# Patient Record
Sex: Male | Born: 1945 | Race: White | Hispanic: No | Marital: Married | Smoking: Never smoker
Health system: Southern US, Community
[De-identification: ages and names within clinical notes are randomized; demographics above are authoritative.]

## PROBLEM LIST (undated history)

## (undated) DIAGNOSIS — I10 Essential (primary) hypertension: Secondary | ICD-10-CM

## (undated) DIAGNOSIS — E7800 Pure hypercholesterolemia, unspecified: Secondary | ICD-10-CM

## (undated) DIAGNOSIS — R0602 Shortness of breath: Secondary | ICD-10-CM

## (undated) DIAGNOSIS — I2510 Atherosclerotic heart disease of native coronary artery without angina pectoris: Secondary | ICD-10-CM

## (undated) DIAGNOSIS — I5022 Chronic systolic (congestive) heart failure: Secondary | ICD-10-CM

## (undated) DIAGNOSIS — G4730 Sleep apnea, unspecified: Secondary | ICD-10-CM

## (undated) DIAGNOSIS — K8020 Calculus of gallbladder without cholecystitis without obstruction: Secondary | ICD-10-CM

## (undated) DIAGNOSIS — K219 Gastro-esophageal reflux disease without esophagitis: Secondary | ICD-10-CM

## (undated) DIAGNOSIS — K5900 Constipation, unspecified: Secondary | ICD-10-CM

## (undated) DIAGNOSIS — K31819 Angiodysplasia of stomach and duodenum without bleeding: Secondary | ICD-10-CM

## (undated) DIAGNOSIS — K7460 Unspecified cirrhosis of liver: Secondary | ICD-10-CM

## (undated) DIAGNOSIS — I219 Acute myocardial infarction, unspecified: Secondary | ICD-10-CM

## (undated) DIAGNOSIS — J189 Pneumonia, unspecified organism: Secondary | ICD-10-CM

## (undated) DIAGNOSIS — D649 Anemia, unspecified: Secondary | ICD-10-CM

## (undated) DIAGNOSIS — I509 Heart failure, unspecified: Secondary | ICD-10-CM

## (undated) HISTORY — PX: CAROTID ENDARTERECTOMY: SUR193

## (undated) HISTORY — PX: CORONARY ARTERY BYPASS GRAFT: SHX141

## (undated) HISTORY — PX: EYE SURGERY: SHX253

## (undated) HISTORY — PX: TONSILLECTOMY: SUR1361

## (undated) HISTORY — PX: SHOULDER SURGERY: SHX246

## (undated) HISTORY — PX: CARDIAC CATHETERIZATION: SHX172

## (undated) HISTORY — PX: COLONOSCOPY: SHX174

---

## 1997-11-01 ENCOUNTER — Encounter: Admission: RE | Admit: 1997-11-01 | Discharge: 1997-11-01 | Payer: Self-pay | Admitting: Internal Medicine

## 1997-12-13 ENCOUNTER — Encounter: Admission: RE | Admit: 1997-12-13 | Discharge: 1997-12-13 | Payer: Self-pay | Admitting: Internal Medicine

## 1998-02-19 ENCOUNTER — Encounter: Admission: RE | Admit: 1998-02-19 | Discharge: 1998-02-19 | Payer: Self-pay | Admitting: Internal Medicine

## 1998-08-28 ENCOUNTER — Encounter: Admission: RE | Admit: 1998-08-28 | Discharge: 1998-08-28 | Payer: Self-pay | Admitting: Internal Medicine

## 1998-10-27 ENCOUNTER — Encounter: Admission: RE | Admit: 1998-10-27 | Discharge: 1998-10-27 | Payer: Self-pay | Admitting: Hematology and Oncology

## 1999-01-06 ENCOUNTER — Encounter: Admission: RE | Admit: 1999-01-06 | Discharge: 1999-01-06 | Payer: Self-pay | Admitting: Hematology and Oncology

## 1999-03-10 ENCOUNTER — Encounter: Admission: RE | Admit: 1999-03-10 | Discharge: 1999-03-10 | Payer: Self-pay | Admitting: Internal Medicine

## 1999-03-11 ENCOUNTER — Encounter: Admission: RE | Admit: 1999-03-11 | Discharge: 1999-03-11 | Payer: Self-pay | Admitting: Hematology and Oncology

## 1999-03-26 ENCOUNTER — Encounter: Admission: RE | Admit: 1999-03-26 | Discharge: 1999-03-26 | Payer: Self-pay | Admitting: Internal Medicine

## 1999-04-14 ENCOUNTER — Encounter: Admission: RE | Admit: 1999-04-14 | Discharge: 1999-04-14 | Payer: Self-pay | Admitting: Internal Medicine

## 1999-06-23 ENCOUNTER — Encounter: Admission: RE | Admit: 1999-06-23 | Discharge: 1999-06-23 | Payer: Self-pay | Admitting: Internal Medicine

## 1999-10-01 ENCOUNTER — Encounter: Admission: RE | Admit: 1999-10-01 | Discharge: 1999-10-01 | Payer: Self-pay | Admitting: Internal Medicine

## 1999-11-13 ENCOUNTER — Encounter: Admission: RE | Admit: 1999-11-13 | Discharge: 1999-11-13 | Payer: Self-pay | Admitting: Internal Medicine

## 1999-11-26 ENCOUNTER — Inpatient Hospital Stay (HOSPITAL_COMMUNITY): Admission: EM | Admit: 1999-11-26 | Discharge: 1999-11-28 | Payer: Self-pay | Admitting: Emergency Medicine

## 1999-11-26 ENCOUNTER — Encounter: Payer: Self-pay | Admitting: *Deleted

## 1999-12-17 ENCOUNTER — Encounter: Payer: Self-pay | Admitting: Cardiology

## 1999-12-18 ENCOUNTER — Inpatient Hospital Stay (HOSPITAL_COMMUNITY): Admission: EM | Admit: 1999-12-18 | Discharge: 1999-12-22 | Payer: Self-pay | Admitting: Emergency Medicine

## 2000-02-01 ENCOUNTER — Encounter: Admission: RE | Admit: 2000-02-01 | Discharge: 2000-02-01 | Payer: Self-pay | Admitting: Internal Medicine

## 2000-03-24 ENCOUNTER — Encounter: Admission: RE | Admit: 2000-03-24 | Discharge: 2000-03-24 | Payer: Self-pay | Admitting: Hematology and Oncology

## 2000-04-04 ENCOUNTER — Encounter: Admission: RE | Admit: 2000-04-04 | Discharge: 2000-07-03 | Payer: Self-pay | Admitting: *Deleted

## 2000-04-14 ENCOUNTER — Encounter: Admission: RE | Admit: 2000-04-14 | Discharge: 2000-04-14 | Payer: Self-pay

## 2000-04-28 ENCOUNTER — Encounter: Admission: RE | Admit: 2000-04-28 | Discharge: 2000-04-28 | Payer: Self-pay | Admitting: Internal Medicine

## 2000-05-18 ENCOUNTER — Encounter: Admission: RE | Admit: 2000-05-18 | Discharge: 2000-05-18 | Payer: Self-pay

## 2000-06-17 ENCOUNTER — Encounter: Admission: RE | Admit: 2000-06-17 | Discharge: 2000-06-17 | Payer: Self-pay | Admitting: Internal Medicine

## 2000-06-27 ENCOUNTER — Inpatient Hospital Stay (HOSPITAL_COMMUNITY): Admission: EM | Admit: 2000-06-27 | Discharge: 2000-06-30 | Payer: Self-pay | Admitting: Emergency Medicine

## 2000-07-04 ENCOUNTER — Encounter: Admission: RE | Admit: 2000-07-04 | Discharge: 2000-07-04 | Payer: Self-pay | Admitting: Internal Medicine

## 2000-07-19 ENCOUNTER — Encounter: Payer: Self-pay | Admitting: *Deleted

## 2000-07-19 ENCOUNTER — Ambulatory Visit (HOSPITAL_COMMUNITY): Admission: RE | Admit: 2000-07-19 | Discharge: 2000-07-19 | Payer: Self-pay | Admitting: *Deleted

## 2000-07-22 ENCOUNTER — Encounter: Payer: Self-pay | Admitting: *Deleted

## 2000-07-26 ENCOUNTER — Encounter (INDEPENDENT_AMBULATORY_CARE_PROVIDER_SITE_OTHER): Payer: Self-pay | Admitting: Specialist

## 2000-07-26 ENCOUNTER — Inpatient Hospital Stay (HOSPITAL_COMMUNITY): Admission: RE | Admit: 2000-07-26 | Discharge: 2000-07-27 | Payer: Self-pay | Admitting: *Deleted

## 2000-08-24 ENCOUNTER — Encounter: Admission: RE | Admit: 2000-08-24 | Discharge: 2000-08-24 | Payer: Self-pay | Admitting: Internal Medicine

## 2000-08-30 ENCOUNTER — Encounter: Admission: RE | Admit: 2000-08-30 | Discharge: 2000-08-30 | Payer: Self-pay | Admitting: Internal Medicine

## 2000-10-07 ENCOUNTER — Encounter: Admission: RE | Admit: 2000-10-07 | Discharge: 2000-10-07 | Payer: Self-pay

## 2000-10-26 ENCOUNTER — Inpatient Hospital Stay (HOSPITAL_COMMUNITY): Admission: EM | Admit: 2000-10-26 | Discharge: 2000-10-28 | Payer: Self-pay | Admitting: Emergency Medicine

## 2000-10-26 ENCOUNTER — Encounter: Payer: Self-pay | Admitting: Emergency Medicine

## 2000-11-10 ENCOUNTER — Encounter: Admission: RE | Admit: 2000-11-10 | Discharge: 2000-11-10 | Payer: Self-pay | Admitting: Internal Medicine

## 2000-11-22 ENCOUNTER — Encounter: Admission: RE | Admit: 2000-11-22 | Discharge: 2000-11-22 | Payer: Self-pay | Admitting: Internal Medicine

## 2000-11-28 ENCOUNTER — Encounter: Admission: RE | Admit: 2000-11-28 | Discharge: 2000-11-28 | Payer: Self-pay | Admitting: Internal Medicine

## 2000-12-30 ENCOUNTER — Encounter: Admission: RE | Admit: 2000-12-30 | Discharge: 2000-12-30 | Payer: Self-pay | Admitting: Internal Medicine

## 2001-01-09 ENCOUNTER — Ambulatory Visit (HOSPITAL_COMMUNITY): Admission: RE | Admit: 2001-01-09 | Discharge: 2001-01-09 | Payer: Self-pay | Admitting: Internal Medicine

## 2001-01-09 ENCOUNTER — Encounter: Payer: Self-pay | Admitting: Internal Medicine

## 2001-01-09 ENCOUNTER — Encounter: Admission: RE | Admit: 2001-01-09 | Discharge: 2001-01-09 | Payer: Self-pay | Admitting: Internal Medicine

## 2001-01-25 ENCOUNTER — Encounter: Admission: RE | Admit: 2001-01-25 | Discharge: 2001-01-25 | Payer: Self-pay | Admitting: Internal Medicine

## 2001-02-26 ENCOUNTER — Emergency Department (HOSPITAL_COMMUNITY): Admission: EM | Admit: 2001-02-26 | Discharge: 2001-02-26 | Payer: Self-pay | Admitting: Emergency Medicine

## 2001-02-28 ENCOUNTER — Encounter: Admission: RE | Admit: 2001-02-28 | Discharge: 2001-02-28 | Payer: Self-pay | Admitting: Internal Medicine

## 2001-02-28 ENCOUNTER — Ambulatory Visit (HOSPITAL_COMMUNITY): Admission: RE | Admit: 2001-02-28 | Discharge: 2001-02-28 | Payer: Self-pay | Admitting: *Deleted

## 2001-03-03 ENCOUNTER — Encounter: Admission: RE | Admit: 2001-03-03 | Discharge: 2001-03-03 | Payer: Self-pay | Admitting: Internal Medicine

## 2001-04-12 ENCOUNTER — Encounter: Admission: RE | Admit: 2001-04-12 | Discharge: 2001-04-12 | Payer: Self-pay

## 2001-06-04 ENCOUNTER — Encounter: Payer: Self-pay | Admitting: Emergency Medicine

## 2001-06-04 ENCOUNTER — Inpatient Hospital Stay (HOSPITAL_COMMUNITY): Admission: EM | Admit: 2001-06-04 | Discharge: 2001-06-06 | Payer: Self-pay | Admitting: Emergency Medicine

## 2001-06-18 ENCOUNTER — Encounter: Payer: Self-pay | Admitting: Emergency Medicine

## 2001-06-18 ENCOUNTER — Inpatient Hospital Stay (HOSPITAL_COMMUNITY): Admission: EM | Admit: 2001-06-18 | Discharge: 2001-06-20 | Payer: Self-pay | Admitting: Emergency Medicine

## 2001-06-18 ENCOUNTER — Encounter: Payer: Self-pay | Admitting: Internal Medicine

## 2001-07-31 ENCOUNTER — Encounter: Admission: RE | Admit: 2001-07-31 | Discharge: 2001-07-31 | Payer: Self-pay | Admitting: Internal Medicine

## 2001-08-28 ENCOUNTER — Encounter: Admission: RE | Admit: 2001-08-28 | Discharge: 2001-08-28 | Payer: Self-pay

## 2001-10-10 ENCOUNTER — Encounter: Admission: RE | Admit: 2001-10-10 | Discharge: 2001-10-10 | Payer: Self-pay | Admitting: Internal Medicine

## 2001-11-23 ENCOUNTER — Encounter: Admission: RE | Admit: 2001-11-23 | Discharge: 2001-11-23 | Payer: Self-pay | Admitting: Internal Medicine

## 2001-12-07 ENCOUNTER — Encounter: Admission: RE | Admit: 2001-12-07 | Discharge: 2001-12-07 | Payer: Self-pay | Admitting: Internal Medicine

## 2002-01-08 ENCOUNTER — Inpatient Hospital Stay (HOSPITAL_COMMUNITY): Admission: AD | Admit: 2002-01-08 | Discharge: 2002-01-11 | Payer: Self-pay | Admitting: Internal Medicine

## 2002-01-08 ENCOUNTER — Ambulatory Visit (HOSPITAL_COMMUNITY): Admission: RE | Admit: 2002-01-08 | Discharge: 2002-01-08 | Payer: Self-pay | Admitting: Internal Medicine

## 2002-01-08 ENCOUNTER — Encounter: Payer: Self-pay | Admitting: Internal Medicine

## 2002-01-08 ENCOUNTER — Encounter: Admission: RE | Admit: 2002-01-08 | Discharge: 2002-01-08 | Payer: Self-pay | Admitting: Internal Medicine

## 2002-01-26 ENCOUNTER — Encounter: Admission: RE | Admit: 2002-01-26 | Discharge: 2002-01-26 | Payer: Self-pay | Admitting: Internal Medicine

## 2002-02-21 ENCOUNTER — Emergency Department (HOSPITAL_COMMUNITY): Admission: EM | Admit: 2002-02-21 | Discharge: 2002-02-22 | Payer: Self-pay | Admitting: Emergency Medicine

## 2002-03-24 ENCOUNTER — Encounter (HOSPITAL_COMMUNITY): Admission: RE | Admit: 2002-03-24 | Discharge: 2002-06-14 | Payer: Self-pay | Admitting: *Deleted

## 2002-04-02 ENCOUNTER — Encounter: Admission: RE | Admit: 2002-04-02 | Discharge: 2002-04-02 | Payer: Self-pay | Admitting: Internal Medicine

## 2002-04-02 ENCOUNTER — Ambulatory Visit (HOSPITAL_COMMUNITY): Admission: RE | Admit: 2002-04-02 | Discharge: 2002-04-02 | Payer: Self-pay | Admitting: Internal Medicine

## 2002-04-06 ENCOUNTER — Encounter: Admission: RE | Admit: 2002-04-06 | Discharge: 2002-04-06 | Payer: Self-pay | Admitting: Internal Medicine

## 2002-04-23 ENCOUNTER — Emergency Department (HOSPITAL_COMMUNITY): Admission: EM | Admit: 2002-04-23 | Discharge: 2002-04-23 | Payer: Self-pay | Admitting: Emergency Medicine

## 2002-04-23 ENCOUNTER — Encounter: Payer: Self-pay | Admitting: Cardiology

## 2002-06-01 ENCOUNTER — Encounter: Admission: RE | Admit: 2002-06-01 | Discharge: 2002-06-01 | Payer: Self-pay | Admitting: Internal Medicine

## 2002-08-30 ENCOUNTER — Encounter: Admission: RE | Admit: 2002-08-30 | Discharge: 2002-08-30 | Payer: Self-pay | Admitting: Internal Medicine

## 2002-09-13 ENCOUNTER — Encounter: Payer: Self-pay | Admitting: Cardiology

## 2002-09-13 ENCOUNTER — Ambulatory Visit (HOSPITAL_COMMUNITY): Admission: RE | Admit: 2002-09-13 | Discharge: 2002-09-13 | Payer: Self-pay | Admitting: Internal Medicine

## 2002-10-18 ENCOUNTER — Encounter: Payer: Self-pay | Admitting: Internal Medicine

## 2002-10-18 ENCOUNTER — Encounter: Admission: RE | Admit: 2002-10-18 | Discharge: 2002-10-18 | Payer: Self-pay | Admitting: Internal Medicine

## 2002-10-18 ENCOUNTER — Ambulatory Visit (HOSPITAL_COMMUNITY): Admission: RE | Admit: 2002-10-18 | Discharge: 2002-10-18 | Payer: Self-pay | Admitting: Internal Medicine

## 2002-10-23 ENCOUNTER — Encounter: Admission: RE | Admit: 2002-10-23 | Discharge: 2002-10-23 | Payer: Self-pay | Admitting: Internal Medicine

## 2002-11-15 ENCOUNTER — Encounter: Admission: RE | Admit: 2002-11-15 | Discharge: 2002-11-15 | Payer: Self-pay | Admitting: Internal Medicine

## 2002-11-22 ENCOUNTER — Encounter: Admission: RE | Admit: 2002-11-22 | Discharge: 2002-11-22 | Payer: Self-pay | Admitting: Internal Medicine

## 2002-12-13 ENCOUNTER — Encounter: Admission: RE | Admit: 2002-12-13 | Discharge: 2002-12-13 | Payer: Self-pay | Admitting: Internal Medicine

## 2002-12-13 ENCOUNTER — Ambulatory Visit (HOSPITAL_COMMUNITY): Admission: RE | Admit: 2002-12-13 | Discharge: 2002-12-13 | Payer: Self-pay | Admitting: Internal Medicine

## 2003-01-07 ENCOUNTER — Encounter: Admission: RE | Admit: 2003-01-07 | Discharge: 2003-01-07 | Payer: Self-pay | Admitting: Internal Medicine

## 2003-01-07 ENCOUNTER — Ambulatory Visit (HOSPITAL_COMMUNITY): Admission: RE | Admit: 2003-01-07 | Discharge: 2003-01-07 | Payer: Self-pay | Admitting: Internal Medicine

## 2003-01-07 ENCOUNTER — Inpatient Hospital Stay (HOSPITAL_COMMUNITY): Admission: AD | Admit: 2003-01-07 | Discharge: 2003-01-09 | Payer: Self-pay | Admitting: Internal Medicine

## 2003-01-29 ENCOUNTER — Encounter: Admission: RE | Admit: 2003-01-29 | Discharge: 2003-01-29 | Payer: Self-pay | Admitting: Internal Medicine

## 2003-01-29 ENCOUNTER — Ambulatory Visit (HOSPITAL_COMMUNITY): Admission: RE | Admit: 2003-01-29 | Discharge: 2003-01-29 | Payer: Self-pay | Admitting: Internal Medicine

## 2003-03-18 ENCOUNTER — Encounter: Admission: RE | Admit: 2003-03-18 | Discharge: 2003-03-18 | Payer: Self-pay | Admitting: Internal Medicine

## 2003-03-22 ENCOUNTER — Encounter: Admission: RE | Admit: 2003-03-22 | Discharge: 2003-03-22 | Payer: Self-pay | Admitting: Internal Medicine

## 2003-03-22 ENCOUNTER — Encounter: Payer: Self-pay | Admitting: Internal Medicine

## 2003-03-22 ENCOUNTER — Ambulatory Visit (HOSPITAL_COMMUNITY): Admission: RE | Admit: 2003-03-22 | Discharge: 2003-03-22 | Payer: Self-pay | Admitting: Internal Medicine

## 2003-03-27 ENCOUNTER — Encounter: Admission: RE | Admit: 2003-03-27 | Discharge: 2003-03-27 | Payer: Self-pay | Admitting: Internal Medicine

## 2003-05-02 ENCOUNTER — Encounter: Admission: RE | Admit: 2003-05-02 | Discharge: 2003-05-02 | Payer: Self-pay | Admitting: Internal Medicine

## 2003-05-07 ENCOUNTER — Encounter: Admission: RE | Admit: 2003-05-07 | Discharge: 2003-05-07 | Payer: Self-pay | Admitting: Internal Medicine

## 2003-07-10 ENCOUNTER — Inpatient Hospital Stay (HOSPITAL_COMMUNITY): Admission: AD | Admit: 2003-07-10 | Discharge: 2003-07-12 | Payer: Self-pay | Admitting: Cardiology

## 2004-01-28 ENCOUNTER — Emergency Department (HOSPITAL_COMMUNITY): Admission: EM | Admit: 2004-01-28 | Discharge: 2004-01-28 | Payer: Self-pay | Admitting: Emergency Medicine

## 2004-07-02 ENCOUNTER — Ambulatory Visit: Payer: Self-pay | Admitting: Family Medicine

## 2004-07-17 ENCOUNTER — Ambulatory Visit: Payer: Self-pay | Admitting: Family Medicine

## 2004-08-26 ENCOUNTER — Ambulatory Visit: Payer: Self-pay | Admitting: Family Medicine

## 2004-09-28 ENCOUNTER — Ambulatory Visit: Payer: Self-pay | Admitting: Family Medicine

## 2004-10-07 ENCOUNTER — Ambulatory Visit: Payer: Self-pay | Admitting: *Deleted

## 2004-10-20 ENCOUNTER — Ambulatory Visit: Payer: Self-pay

## 2004-10-20 ENCOUNTER — Ambulatory Visit: Payer: Self-pay | Admitting: *Deleted

## 2004-11-03 ENCOUNTER — Ambulatory Visit: Payer: Self-pay | Admitting: Family Medicine

## 2004-11-10 ENCOUNTER — Ambulatory Visit: Payer: Self-pay | Admitting: Family Medicine

## 2004-11-13 ENCOUNTER — Ambulatory Visit: Payer: Self-pay | Admitting: Family Medicine

## 2005-03-19 ENCOUNTER — Ambulatory Visit: Payer: Self-pay | Admitting: Family Medicine

## 2005-07-27 ENCOUNTER — Ambulatory Visit: Payer: Self-pay | Admitting: Family Medicine

## 2005-08-12 ENCOUNTER — Ambulatory Visit: Payer: Self-pay | Admitting: Family Medicine

## 2006-05-09 ENCOUNTER — Ambulatory Visit: Payer: Self-pay | Admitting: Internal Medicine

## 2006-05-10 ENCOUNTER — Ambulatory Visit: Payer: Self-pay | Admitting: Cardiology

## 2006-05-10 ENCOUNTER — Inpatient Hospital Stay (HOSPITAL_COMMUNITY): Admission: RE | Admit: 2006-05-10 | Discharge: 2006-05-12 | Payer: Self-pay | Admitting: Cardiology

## 2006-05-24 ENCOUNTER — Ambulatory Visit: Payer: Self-pay | Admitting: *Deleted

## 2006-05-24 ENCOUNTER — Inpatient Hospital Stay (HOSPITAL_COMMUNITY): Admission: AD | Admit: 2006-05-24 | Discharge: 2006-05-26 | Payer: Self-pay | Admitting: *Deleted

## 2007-01-11 ENCOUNTER — Inpatient Hospital Stay (HOSPITAL_COMMUNITY): Admission: EM | Admit: 2007-01-11 | Discharge: 2007-01-14 | Payer: Self-pay | Admitting: *Deleted

## 2007-01-11 ENCOUNTER — Ambulatory Visit: Payer: Self-pay | Admitting: Cardiology

## 2007-01-25 ENCOUNTER — Ambulatory Visit: Payer: Self-pay | Admitting: Cardiovascular Disease

## 2007-06-05 ENCOUNTER — Ambulatory Visit: Payer: Self-pay | Admitting: Cardiovascular Disease

## 2007-07-18 ENCOUNTER — Ambulatory Visit: Payer: Self-pay | Admitting: Cardiovascular Disease

## 2007-07-18 ENCOUNTER — Ambulatory Visit: Payer: Self-pay

## 2007-07-18 ENCOUNTER — Encounter: Payer: Self-pay | Admitting: Cardiovascular Disease

## 2007-07-18 LAB — CONVERTED CEMR LAB
CO2: 22 meq/L (ref 19–32)
Calcium: 8.5 mg/dL (ref 8.4–10.5)
Creatinine, Ser: 1.1 mg/dL (ref 0.4–1.5)
GFR calc Af Amer: 88 mL/min
Glucose, Bld: 344 mg/dL — ABNORMAL HIGH (ref 70–99)

## 2007-07-26 ENCOUNTER — Ambulatory Visit: Payer: Self-pay | Admitting: Internal Medicine

## 2007-08-28 ENCOUNTER — Ambulatory Visit: Payer: Self-pay | Admitting: Cardiovascular Disease

## 2007-10-12 ENCOUNTER — Emergency Department (HOSPITAL_COMMUNITY): Admission: EM | Admit: 2007-10-12 | Discharge: 2007-10-12 | Payer: Self-pay | Admitting: Emergency Medicine

## 2008-10-14 ENCOUNTER — Encounter: Payer: Self-pay | Admitting: Cardiovascular Disease

## 2009-01-08 ENCOUNTER — Encounter: Payer: Self-pay | Admitting: Cardiovascular Disease

## 2009-07-02 ENCOUNTER — Encounter: Payer: Self-pay | Admitting: Cardiovascular Disease

## 2009-09-22 ENCOUNTER — Encounter: Payer: Self-pay | Admitting: Cardiovascular Disease

## 2010-03-23 ENCOUNTER — Encounter: Payer: Self-pay | Admitting: Cardiovascular Disease

## 2010-07-21 NOTE — Letter (Signed)
Summary: [facility] Return Visit  [facility] Return Visit   Imported By: Roderic Ovens 08/05/2009 15:13:33  _____________________________________________________________________  External Attachment:    Type:   Image     Comment:   External Document

## 2010-07-21 NOTE — Letter (Signed)
Summary: [facility] Return Visit  [facility] Return Visit   Imported By: Roderic Ovens 10/27/2009 11:07:06  _____________________________________________________________________  External Attachment:    Type:   Image     Comment:   External Document

## 2010-07-21 NOTE — Letter (Signed)
Summary: [facility] Medical Return Cardiology Visit   [facility] Medical Return Cardiology Visit   Imported By: Roderic Ovens 04/24/2010 12:47:34  _____________________________________________________________________  External Attachment:    Type:   Image     Comment:   External Document

## 2010-11-03 NOTE — Assessment & Plan Note (Signed)
[facility]                            CARDIOLOGY OFFICE NOTE   MALVERN, KADLEC                      MRN:          [id number]  DATE:06/05/2007                            DOB:          1945/09/27    HISTORY:  [name] returns for followup at the [facility] Cardiology  Office on June 05, 2007.  [name] is a 65 year old gentleman  with advanced coronary artery disease, persistent angina and chronic  dyspnea that has been attributed to diastolic heart failure.   Most recent assessment of [name] LV function was approximately 1  year ago where he had a left heart catheterization with ventriculography  that showed hypokinesis of the inferior wall, but overall preserved  LVEF.  He has had elevated diastolic filling pressures at the time of  his catheterization procedures.   From a symptomatic standpoint, [name] is really quite limited.  He  describes shortness of breath and chest pressure with minimal activity.  He is unable to initiate any type of exercise program because of his  marked symptoms.  At his last office visit, I increased his Imdur from  60 to 90 mg daily.  I also doubled his Lasix from 40 to 80 mg daily.  He  did not appreciate much improvement with those changes.  He tells me  that his medications have been further adjusted by Dr. Sol Passer.  He has  recently had some trouble with hypotension, but that this has improved  after further medication adjustments.  [name] denies orthopnea, PND  or edema.   CURRENT MEDICATIONS:  1. Lipitor 80 mg daily.  2. Zetia 10 mg daily.  3. Diovan 160 mg daily.  4. Zoloft 100 mg daily.  5. Lantus 80 units daily.  6. Humalog 10 units t.i.d.  7. Plavix 75 mg daily.  8. Aspirin 325 mg daily.  9. Coreg 25 mg twice daily.  10.Imdur 90 mg daily.  11.Lasix 80 mg daily.   ALLERGIES:  NKDA.   PHYSICAL EXAMINATION:  GENERAL:  [name] is alert and oriented.  He  is in no acute  distress.  VITAL SIGNS:  Weight 256, blood pressure 125/68, heart rate 68.  HEENT:  Normal.  NECK:  Normal carotid upstrokes without bruits.  Regular venous pressure  is normal.  LUNGS:  Clear bilaterally.  HEART:  Regular rate and rhythm without murmurs or gallops.  ABDOMEN:  Soft, obese, nontender.  No bruits.  EXTREMITIES:  Trace edema bilaterally in the pretibial region.  Pedal  pulses are 2+ and equal.   DIAGNOSTICS:  EKG shows sinus rhythm with first-degree AV block and a T-  wave abnormality consistent with lateral wall ischemia.   ASSESSMENT:  1. Coronary artery disease status post coronary artery bypass graft      and multiple percutaneous coronary intervention procedures with      chronic [place] Cardiovascular Society class III angina.  Mr.      Woodard is still significantly limited by angina.  It is difficult      to adjust his medications any further as he continues to  have      intermittent problems with hypotension.  I have asked him to      continue his current therapy without changes at this point.  Since      I have been following him, he has undergone 2 revascularization      procedures with a stent to a saphenous vein graft as well as to the      native right coronary artery.  He has had no improvement in      symptoms with either of these procedures.  I suspect he has      significant small-vessel coronary artery disease.  2. Diastolic heart failure.  I would like to repeat an      electrocardiogram to try to get an assessment of [name]      filling parameters.  We will continue his current medical regimen.      He is so highly symptomatic that I think he may benefit from      outpatient invasive hemodynamic monitoring.  I am going to consider      him for the CardioMEMS device which is a pulmonary arterial      monitoring device.  After review of his echocardiogram, we will      make further inquiries into this device with Dr. Gala Romney.   FOLLOW UP:   I would like to see [name] back in 4 weeks.  We will  review his echo and also I have ordered a metabolic panel and BNP.     Veverly Fells. Excell Seltzer, MD  Electronically Signed    MDC/MedQ  DD: 06/05/2007  DT: 06/06/2007  Job #: [id number]   cc:   Dina Rich

## 2010-11-03 NOTE — H&P (Signed)
[name], [name] NO.:  [id number]   MEDICAL RECORD NO.:  [id number]          PATIENT TYPE:  EMS   LOCATION:  MAJO                         FACILITY:  [facility]   PHYSICIAN:  Gerrit Friends. Dietrich Pates, MD, FACCDATE OF BIRTH:  1946/04/08   DATE OF ADMISSION:  01/11/2007  DATE OF DISCHARGE:                              HISTORY & PHYSICAL   PRIMARY CARE PHYSICIAN:  Dr. Dina Rich in [place], [place].   PRIMARY CARDIOLOGIST:  Primary cardiologist was Dr. Glennon Hamilton, now Dr.  Tonny Bollman.   CHIEF COMPLAINT:  Chest pain.   HISTORY OF PRESENT ILLNESS:  [name] is a 65 year old male with a  history of coronary artery disease.  He complains of a 2-week history of  cough and increased chest pain.  He routinely has chest pain requiring  nitroglycerin 1-2 times a week with exertion.  For the past 2 weeks, he  has had increased frequency and intensity of his chest pain episodes as  well as increased dyspnea on exertion and a cough productive of clear  sputum.  He also has sometimes had shortness of breath at rest.  Last  week, his chest pain accelerated to the point to where he was having  chest pain every day.  It was still helped by nitroglycerin, but he  called the office because of the increased frequency of his symptoms and  was told to come to the emergency room.  In the emergency room, he is  currently pain-free.  He still has increased cough and his chest pain  was severe enough to where he got nauseated and vomited x1 earlier  today, but he no longer feels nauseated.  He has not been diaphoretic.  Recently, his CBG has have been running higher than usual as well.  He  states at home they run in the low 200s and have been in the mid to high  300s recently without any change in p.o. intake.  He states that he has  had bronchitis and colds before, but does not feel like this is it.  He  feels like he has felt before when he had extra fluid on board.  He  reports increased girth and slightly increased daytime lower extremity  edema, but does not weigh himself and is not aware of any change in his  weight.   PAST MEDICAL HISTORY:  1. Status post aortocoronary bypass surgery in 1993 with a redo in      1999.  2. Status post PTCA and bare-metal stent to the SVG to circumflex in      November 2007.  3. Status post cardiac catheterization in December 2007 with LAD      totaled, circumflex totaled and RCA 80% in the midportion, SVG to      PDA widely patent, with an 80% lesion in the distal PDA, which is a      small vessel, SVG to circumflex patent with 2 patent stents and a      distal 70% to 80% stenosis that is a small vessel.  Free RIMA to  ramus intermedius is patent with no critical distal disease, LIMA      to LAD widely patent with no significant distal disease, medical      therapy for disease, not felt amenable to percutaneous      intervention.  4. Mild left ventricular dysfunction with an EF of 40% to 50% by      echocardiogram in March 2004.  5. Obesity.  6. 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  7. Family history of coronary artery disease.  8. Hypertension.  9. Hyperlipidemia.  10.History of CVA/left carotid endarterectomy.   PAST SURGICAL HISTORY:  He is status post left carotid endarterectomy as  well as multiple cardiac catheterization and bypass surgery x2.   ALLERGIES:  He has been intolerant to NORVASC and AVANDIA, which cause  edema, and LASIX, which causes cramps, although he is tolerating it with  potassium supplementation.   CURRENT MEDICATIONS:  1. Lipitor 80 mg nightly.  2. Zetia 10 mg a day.  3. Imdur 60 mg a day.  4. Diovan 160 mg daily.  5. Zoloft 100 mg a day.  6. Lasix 80 mg a day.  7. Aspirin 81 mg a day.  8. Coreg 25 mg b.i.d.  9. Lantus 80 units q.a.m.  10.Potassium 20 mEq daily.  11.Nitroglycerin sublingual p.r.n.  12.Humalog continued at t.i.d. a.c.   SOCIAL  HISTORY:  He lives in [place] with his wife and is a retired  Product/process development scientist without any history of alcohol, tobacco or drug  abuse.   FAMILY HISTORY:  Both his parents died in a motor vehicle accident in  January 2008.  His father does have a history of coronary artery  disease.  His brother has coronary artery disease.   REVIEW OF SYSTEMS:  He complains of shortness of breath as well as  increased dyspnea on exertion, orthopnea, PND, coughing and wheezing.  He has had some reflux symptoms.  He has decreased frequency of  urination.  He denies hematemesis, hemoptysis, melena, fevers or chills.  Review of systems is otherwise negative.   PHYSICAL EXAM:  VITAL SIGNS:  Temperature is 98.7, blood pressure  176/83, pulse 77, respiratory rate 22, O2 saturation 98% on room air.  GENERAL:  He is a well-developed, obese white male in no acute distress.  HEENT:  Normal.  NECK:  No lymphadenopathy, thyromegaly or JVD are noted, but he has a  bruit on the left versus radiation of murmur and a soft bruit on the  right as well.  CV:  His heart is regular in rate and rhythm with an S1 and S2 and a  systolic murmur is noted.  Distal pulses are intact in all 4  extremities.  LUNGS:  He has some rales in the base with a few crackles.  SKIN:  He has a healing abrasion on his right lower extremity and some  excoriated areas as well.  ABDOMEN:  Soft and nontender with active bowel sounds.  EXTREMITIES:  There is no cyanosis, clubbing or edema noted.  MUSCULOSKELETAL:  No joint deformity or effusions.  There is no spine or  CVA tenderness.  NEUROLOGIC:  He is alert and oriented.  Cranial nerves II-XII grossly  intact.   CHEST X-RAY:  Shows cardiomegaly, bibasilar atelectasis and a history of  bypass surgery.   EKG:  Sinus rhythm with a first-degree AV block and flattened versus  inverted T waves inferiorly, minimal change from an EKG dated December  2007.   LABORATORY  VALUES:   Hemoglobin 12.0, hematocrit 36.6, WBC 8.2, platelets  214,000.  CK-MB 206/5.3 with a troponin I of 0.02.  INR 1.0, PTT 27.  Sodium 132, potassium 4.4, chloride 102, CO2 23, BUN 25, creatinine  1.29, glucose 417.   IMPRESSION:  Chest pain [name] is having increased frequency of his  anginal symptoms.  He will be admitted for further evaluation and  treatment.  His cardiac enzymes will be cycled.  In the morning, the  data will be reviewed to see he would benefit from a repeat  catheterization or if continued medical therapy is recommended.  He will  be diuresed as well for slight volume overload.  A BNP is pending at the  time of dictation.  Further evaluation and treatment will depend on the  results of the above testing.  A hemoglobin A1c will be ordered as well.      Theodore Demark, PA-C      Gerrit Friends. Dietrich Pates, MD, [facility]  Electronically Signed    RB/MEDQ  D:  01/11/2007  T:  01/12/2007  Job:  [id number]   cc:   Dina Rich

## 2010-11-03 NOTE — Discharge Summary (Signed)
[name], [name] NO.:  [id number]   MEDICAL RECORD NO.:  [id number]          PATIENT TYPE:  INP   LOCATION:  6523                         FACILITY:  [facility]   PHYSICIAN:  Pricilla Riffle, MD, FACCDATE OF BIRTH:  01-13-1946   DATE OF ADMISSION:  01/11/2007  DATE OF DISCHARGE:  01/14/2007                               DISCHARGE SUMMARY   Cardiologist, previously Dr. Glennon Hamilton.  He now sees Dr. Tonny Bollman.  Primary care physician is Dr. Dina Rich.   REASON FOR ADMISSION:  Chest pain consistent with unstable angina  pectoris.   DISCHARGE DIAGNOSES:  1. Coronary artery disease.      a.     Status post coronary artery bypass graft with redo coronary       artery bypass graft in 1999.      b.     Status post multiple percutaneous coronary interventions in       the past.      c.     Most recent percutaneous coronary intervention prior to this       admission:  Stenting of the vein graft to the obtuse marginal of       the left circumflex, 05/10/2006.      d.     Status post Promus drug-eluting stent to the native right       coronary artery this admission.  2. Preserved left ventricular function.  3. Transient hypotension this admission - resolved.  4. Transient prerenal azotemia this admission - resolved.  5. Diabetes mellitus, uncontrolled.      a.     Hemoglobin A1c 10.7.  6. Hyperlipidemia, uncontrolled.      a.     LDL 120.  7. Hypertension.  8. Cerebrovascular disease.      a.     History of cerebrovascular accident.  9. History of left carotid endarterectomy.  10.History of neuropathy.  11.History of congestive heart failure.      a.     Suspect chronic diastolic congestive heart failure.   PROCEDURE PERFORMED THIS ADMISSION:  Cardiac catheterization,  percutaneous coronary intervention by Dr. Shawnie Pons.  Please see  his dictated note for complete details.  Briefly, LIMA to LAD patent,  free RIMA patent to the diagonal.  Vein graft to  the OM patent with  distal disease and patent stent, vein graft to the PDA patent; native  RCA with 80% stenosis - treated with Promus drug-eluting stent reducing  stenosis to 0-10%.   HISTORY:  [name] is a 65 year old male patient with a history of  coronary disease as outlined above who presented to the emergency room  at [facility] on the day of admission with complaints of 2  weeks of cough and chest pain.  His pain increased in intensity and  frequency and was associated with shortness of breath with exertion  requiring nitroglycerin.  On presentation, his chest x-ray showed  bibasilar atelectasis, cardiomegaly.  His EKG showed shallow inferior T-  wave inversions.  The patient had recently underwent cardiac  catheterization in December of 2007  revealing patent grafts.  Initially,  it was felt that the patient may be suffering from mild congestive heart  failure.  IV diuretics, bronchodilators and rapid mental were added to  his medical regimen.  BNP was checked.   HOSPITAL COURSE:  The patient ruled out for myocardial function by  enzymes.  He continued to have chest pain that was consistent with  unstable angina pectoris.  His BNP returned at borderline elevated at  194.  The patient did develop hypotension.  He was also orthostatic.  His BUN was noted to be 24 with a creatinine of 1.05.  His diuretics  were held as well as his verapamil.  It was felt that his symptoms were  more consistent with unstable angina pectoris and he was set up for  cardiac catheterization.   Patient underwent cardiac catheterization by Dr. Shawnie Pons on  01/13/2007.  He underwent Promus drug-eluting stent to the native RCA as  outlined above.  He tolerated the procedure well without any  complications.  On the morning of 01/14/2007 he was found to be in  stable condition.  His right femoral arteriotomy site was without  hematoma or bruit.  His hemoglobin was stable at 11.2.  His  creatinine  was stable at 0.98 and his BUN had dropped to 19.  He was without any  further chest pain or shortness of breath and considered stable enough  for discharge to home.   The patient's hemoglobin A1C returned to 10.7.  He notes compliance with  his medications.  His LDL was also elevated at 120.  Again, he notes  compliance with his medications.  It has been advised that the patient  follow with his primary care physician in the next couple of weeks for  further adjustment of his diabetic therapy.  Will set him up for a  repeat lipid panel in the next 4-6 weeks.  He will go home on half dose  Lasix and half dose of potassium given his presentation and will check B  met at his follow-up appointment.   The patient did request a Darvocet prescription secondary to pain from  neuropathy.  He will be provided with No. 15 tablets and no refills, and  he can get further refills from Dr. Sol Passer.   LABS AND ANCILLARY DATA:  At discharge, hemoglobin 11.2, hematocrit  33.2, white count 7700, platelet count 193.  INR 1.  Sodium 134,  potassium 4.4, glucose 288, creatinine 0.99, BUN 19, peak BUN 30, peak  creatinine 1.29, hemoglobin A1c 10.7.  Cardiac markers negative x3.  Total cholesterol 201, triglycerides 287, HDL 24, LDL 120.  TSH 1.484.   Chest X-Ray: Cardiomegaly, bibasilar atelectasis, status post CABG.   DISCHARGE MEDICATIONS:  1. Coated aspirin 325 mg daily.  2. Plavix 75 mg daily.  3. Lipitor 80 mg nightly.  4. Zetua 19 mg daily.  5. Isosorbide mononitrate 60 mg daily.   1. Diovan 160 mg daily.  2. Zoloft 100 mg daily.  3. Furosemide 80 mg 1/2 tablet daily.  4. Potassium 20 mEq 1/2 tablet daily.  5. Carvedilol 25 mg b.i.d.  6. Lantus insulin 80 units daily.  7. Humalog insulin 10 units t.i.d. with meals.  8. Nitroglycerin p.r.n. chest pain.  9. Darvocet N 100 one q. 6-8 hours p.r.n. pain.      a.     As noted above, the patient will get this refilled with Dr.        Sol Passer.   DIET:  Low-fat, low-sodium diabetic diet.   WOUND CARE:  The patient is to go to our office for any groin swelling,  pain, bruising or fever.   ACTIVITY:  The patient is to increase his activity slowly.  He is to do  no lifting for 1 week, no driving for 3 days.  No sexual activity for 2  weeks.  He may walk up steps.  He may shower.   FOLLOWUP:  1. The patient is to follow up with Dr. Excell Seltzer or his physician      assistant in the next 2 weeks.  The office will contact him with an      appointment.  2. As noted above, he will need a B met at followup.  3. He will also be set up for fasting lipids and LFT's in the next 4      weeks.  4. He should follow up with Dr. Sol Passer in the next 1-2 weeks.  He      should call for an appointment.  He will need intensive therapy of      his diabetes for better control.   Total physician PA time greater than 30 minutes on this discharge.      Tereso Newcomer, PA-C      Pricilla Riffle, MD, [facility]  Electronically Signed    SW/MEDQ  D:  01/14/2007  T:  01/15/2007  Job:  [id number]   cc:   Dina Rich

## 2010-11-03 NOTE — Assessment & Plan Note (Signed)
[facility]                            CARDIOLOGY OFFICE NOTE   Woodard, Raymond                      MRN:          [id number]  DATE:07/26/2007                            DOB:          1945/08/11    REFERRING PHYSICIAN:  Veverly Fells. Excell Seltzer, MD   PRIMARY CARE PHYSICIAN:  Dr. Dina Rich.   REASON FOR VISIT:  Consideration of pump CardioMEMS pulmonary artery  sensor.   HISTORY OF PRESENT ILLNESS:  [name] is 65 year old male with  multiple medical problems including diabetes, coronary artery disease  status post previous bypass surgery in 1999 with subsequent a multiple  interventions.  Also has hypertension, hyperlipidemia and obesity.   He has been followed closely by Dr. Excell Seltzer.  He has had problems with  persistent chest pain and dyspnea.  He has undergone several  interventions none of which really seemed to help his symptoms and he  continues to complain of severe fatigue and dyspnea.  Says he really  cannot do much of anything without getting short of breath and he has  not been exercising at all.  He says he has occasional lower extremity  edema and orthopnea.  Denies any PND.  He has had adjustments in his  Lasix dose which have also not help him much.   REVIEW OF SYSTEMS:  He says occasionally he finds himself panting a  little bit at night.  He otherwise sleeps well.  He firmly denies that  he has sleep apnea.  He does have some arthritis pain.  Has not had any  significant bleeding focal neurologic problems.   It was noted on one of his catheterization and EDP was 41.  This was  about a year and half ago.  His subsequent EDP was under 10.  Dr. Excell Seltzer  recently repeated an echocardiogram on him.  This showed ejection  fraction of 50-55%.  There was evidence of diastolic heart failure with  an elevated LV filling pressures.  There is trivial mitral  regurgitation.  RV was not well seen.  Peak pulmonary artery pressure  was  estimated at 29.   PAST MEDICAL HISTORY:  1. Coronary artery disease status post coronary artery bypass surgery      and multiple interventions.  2. History of ischemic cardiomyopathy, ejection fraction of previous      and 35% range now on the 50-55% range.  3. Morbid obesity.  4. Diabetes.  5. Hypertension.  6. Hyperlipidemia.   CURRENT MEDICATIONS:  Lipitor 80, Zetia 10, Diovan 160, Zoloft 100,  Lantus insulin, Humalog, Plavix 75, aspirin 325, Coreg 25 b.i.d. Imdur  90, Lasix 80 b.i.d.   ALLERGIES:  No known drug allergies.   SOCIAL HISTORY:  He is married.  He is retired.  He has no history of  tobacco.  He does not exercise secondary to shortness of breath.   FAMILY HISTORY:  Mother, father both alive.  Father has a history of  myocardial infarction status post bypass.  Mother has a history of  arthritis.   PHYSICAL EXAM:  He is obese man no acute distress  to image on the clinic  without any respiratory difficulty.  Blood pressure is 134 131/62, heart rate 64 weight 257.  HEENT is normal.  Thick neck.  No obvious JVD.  Carotid 2+ bilaterally.  There is a soft  right bruit.  There is no lymphadenopathy or thyromegaly.  CARDIAC:  PMI is nonpalpable.  He is regular rate and rhythm with a soft  2/6 systolic ejection murmur at the left sternal border.  There is no  rub or gallop.  LUNGS:  Clear.  ABDOMEN:  Obese, nontender, nondistended.  No obvious  hepatosplenomegaly, although exam is limited.  Good bowel sounds.  No  bruits.  EXTREMITIES:  Warm with no cyanosis or clubbing.  There is  trace edema.  No rash.  NEURO:  He is alert and x3.  Cranial nerves II-XII are intact was all  four extremities without difficulty.   ASSESSMENT/PLAN:  Dyspnea.  Think this is likely multifactorial given  his echo findings.  I do suspect he has a significant component of  diastolic heart failure which is seems to be fairly well controlled.  However, suspect he probably has sleep apnea as  well as obesity and  deconditioning contributing to his dyspnea.  At this point suggested we  go and proceed with a CPX test to clearly define delineate this  magnitude of his limitation and also assess the various contributions  and various aspects.  I have also suggested a sleep study as we have had  problems the past with the CardioMEMS sensor in people with severe sleep  apnea.  Once results of these test back will stay him back in clinic and  an old couple weeks and discuss with him further about the CardioMEMS  sensor.  I do think he would otherwise be a good candidate.  Otherwise I  think he is on a very good medical regimen for his heart failure.     Bevelyn Buckles. Bensimhon, MD  Electronically Signed    DRB/MedQ  DD: 07/26/2007  DT: 07/27/2007  Job #: [id number]   cc:   Dina Rich

## 2010-11-03 NOTE — Cardiovascular Report (Signed)
[name], [name] NO.:  [id number]   MEDICAL RECORD NO.:  [id number]          PATIENT TYPE:  INP   LOCATION:  6523                         FACILITY:  [facility]   PHYSICIAN:  Arturo Morton. Riley Kill, MD, FACCDATE OF BIRTH:  04-Jul-1945   DATE OF PROCEDURE:  01/14/2007  DATE OF DISCHARGE:  01/14/2007                            CARDIAC CATHETERIZATION   INDICATIONS FOR PROCEDURE:  The patient is a very delightful gentleman  well-known to [place].  He has previously undergone revascularization  surgery.  He has had recurrent episodes of substernal chest pain.  In  the fall of last year, he had stenting of the saphenous vein graft to  the OM system.  The current study was done because of recurrent anginal  type discomfort.  Dr. Excell Seltzer has reviewed the films, and he and I have  discussed the case in detail.  The tentative plan was to potentially  dilate the native right coronary which supplies the posterolateral  segment.  There is a saphenous vein graft that goes to the PDA, but the  PDA proximal to this is segmentally diseased in a moderately severe  fashion.  Moreover, the patient does have disease in the marginal beyond  the graft insertion site, but it is not ideal for a percutaneous  intervention, particularly in a diabetic.  The patient was appropriately  hydrated with a history of diabetes, and brought to the laboratory for  further evaluation.   PROCEDURE PERFORMED:  1. Left heart catheterization.  2. Selective coronary arteriography  3. Saphenous vein graft angiography.  4. Selective left internal mammary angiography.  5. Percutaneous stenting of the right coronary artery.  6. Interactive intravascular ultrasound, both pre and post procedure.   DESCRIPTION OF PROCEDURE:  The patient was brought to the cath lab and  prepped and draped in the usual fashion.  Through an anterior puncture  the femoral artery was entered and a 6-French sheath was placed.  Following this  diagnostic images of the left and right coronaries were  obtained.  Saphenous vein graft and internal mammary angiography were  performed.  The left heart pressures were measured, but we limited left  ventriculography in order to reduce contrast load.  Dr. Excell Seltzer and I  discussed the case and we elected to proceed on with percutaneous  intervention.   The patient had been given chewable aspirin, and received 600 mg of oral  clopidogrel in the laboratory and bivalirudin was given according to  protocol.  ACT was checked and found to be appropriate for percutaneous  intervention.  Following this, a Luge wire was placed in the distal  right coronary.  Preprocedural intravascular ultrasound was done to try  to assess the anatomy, vessel size, and appropriate intervention.   Following this, predilatation was subsequently performed using a 3 mm x  12 Maverick at 10 atmospheres.  We then deployed a 3.5 x 15 [place]  scientific Proma stent.  This was deployed at 14 atmospheres for 64  seconds.  Following this, we went in with a 4 mm PowerSail, 13 mm  length, and high pressure inflations were  performed in the stent.   Following this, intravascular ultrasound was performed.  The ultrasound  demonstrated excellent deployment of the stent throughout the lesion  areas of both the proximal and distal limbs, appear to be significantly  under deployed.  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  This was also  accompanied by calcification.  After the ultrasound was performed, we  elected to go back in and post dilate with a 5 mm, 8 mm length Quantum  Maverick balloon.  This was initially done both proximally and distally  with the distal dilated up to six atmospheres and the proximal up to  nine atmospheres.  We thought this would provide relatively complete  apposition based upon preprocedural measurements.  Unfortunately,  intravascular  ultrasound was then performed and again demonstrated  incomplete apposition of the distal portion of the stent and a  significant incomplete apposition of the proximal portion of the stent.  As a result, we went back with a 5 mm balloon and a 10 atmospheres  inflation was performed at the distal extent.  More proximally, we did  up to a 13 mm 13 atmosphere inflation, proximal portion of the stent and  then intravascular ultrasound was again repeated.  The entire length of  the stent distal up to the very proximal rim was completely and  excellently deployed.   By careful measurement with intravascular ultrasound there is  approximately 0.2 to 0.3 mm with two stent tines and less than one  quadrant which were incompletely opposed.  This was based upon the  nonuniform lumen of the area just above the stent, and it also was  dramatically improved from the previous studies and we elected to not  further post dilate because of the fear of a possible vessel disruption  given the large size of the balloon required to completely oppose this  tiny section of stent.  All catheters were then subsequently removed and  the femoral sheath was sewn into place.  He was taken to his room in  satisfactory clinical condition.   HEMODYNAMIC DATA:  Central aortic pressure was 156/79, mean 110, left  ventricular pressure was 138/30.  There is not a significant gradient on  pullback across aortic valve.   ANGIOGRAPHIC DATA:  1. The left main coronary artery has about 30% narrowing in its distal-      most aspect.  It is heavily calcified.  2. The left anterior descending artery has multiple lesions leading      into a diagonal that is small and diffusely diseased.  There are      probably two 80-90% proximal lesions and the LAD proper is totally      occluded.  The diagonal is small and diffusely diseased.  3. The circumflex proper demonstrates an 80-90% proximal stenosis and      the OM is totally  occluded.  4. The right coronary artery demonstrates 30-40% proximal      calcification and then a mid 80% stenosis.  In the distal vessel,      there is 30-40% segmental plaquing and a  previously placed stent.      There is competitive flow into the PDA with proximal disease in the      PDA as noted above.  There is also flow into a very large      posterolateral branch with some segmental plaquing throughout in a      typical diabetic  appearance.  Following stenting, the right      coronary artery is reduced to about 10% residual luminal narrowing      with what appears to be really an excellent angiographic response.  5. The saphenous vein graft to the obtuse marginal does remain intact.      There is some proximal narrowing of 30-40% and a fair amount of      luminal irregularity.  The previously placed stent appears to be      widely patent.  This does insert into the obtuse marginal branch.      The obtuse marginal branch itself fills both retrograde and      antegrade.  The antegrade portion has about 40-50% segmental      plaquing and then a focal 50-70% area of narrowing prior to its      more distal bifurcation.  This entire segment of the obtuse      marginal branch appears to be fairly typical of diabetes in      appearance.  There is retrograde filling of three small subbranches      and this does not appear to be severely compromised although the      segment leading into that has probably 70% narrowing in the      retrograde fashion.  6. The free LIMA graft is hooded into the top of this obtuse marginal      graft and supplies the diagonal branch.  There is really quite      smooth excellent appearance to this.  7. The saphenous vein graft does insert into the posterior descending      branch.  There is some mild luminal irregularity distally in the      vein graft, but it is not critically narrowed.  The PDA itself has      a typical diabetic appearance more distally and  has at least 70-80%      in a small focal area of what appears to be about 1 mm vessel.  The      PDA filling retrograde does demonstrate what appears to be      moderately high-grade disease of the proximal portion of the      posterior descending filling retrograde.  8. The internal mammary has about 30-40% area of narrowing proximally      which is unchanged from the previous study.  It is not      significantly obstructed.  The internal mammary itself more      distally inserts very nicely into the left anterior descending      artery.  It wraps the apex where there is twin branches at the      apical portion.  There is also a small diagonal which is fairly      typical of diabetes in appearance.   The intervascular ultrasound was done interactively with preprocedural  ultrasound analysis followed by postprocedure evaluation of stent  deployment.  The initial preprocedural vessel analysis demonstrated a  large caliber right coronary artery which in some places was up to 5 mm  x 5 mm in luminal dimension.  Importantly, there is extensive  calcification, and variability in size of the vessel throughout the  contour, and therefore an absolute normal reference vessel diameter was  nearly impossible to attest to in this ultrasound procedure.  At the  original lesion site, the lesional area was 3.59 mm squared.  An area of  calcification just above the lesion without  significant plaquing  demonstrated circumferential calcification with 3.5 x 3.2 lumen.  More  distally, in the area where the vessel would finish demonstrates a 3.5 x  3.5 lumen above the lesion, but with considerable plaque throughout the  vessel.   Following the initial set of dilatations, the stent throughout  approximately 13-14 mm was clearly well deployed.  The distal most  aspect of the stent measuring about 0.5 mm of the stent, at the distal  most aspect was incompletely opposed.  After dilatation to 10  atmospheres  with a 5 mm balloon, there was complete and entire  apposition of the distal aspect of the stent.  Also the whole length of  the stent appeared to be well deployed as previously noted, and the  minimum lumen diameter after the post dilatation with the 5 mm balloon  was clearly improved.  The most proximal aspect of the stent  demonstrated about between 0.2 and 0.4 mm of undeployed stent at the  top.  There was a somewhat oblong appearing lumen which was not  completely round.   At the completion of the procedure there appeared to be only one or two  stent tines located nearly one-to-one quadrant at the area of ovoid  calcification which were not completely opposed but this distance did  not exceed 0.3 mm in length, and was minimal, please see the report.   CONCLUSIONS:  1. Prior coronary revascularization surgery with continued patency of      the internal mammary to the LAD.  2. Continued patency of the saphenous vein graft to the OM following      late 2007 stenting.  3. Continued patency of a free right internal mammary to the diagonal.  4. Continued patency of the saphenous vein graft to the posterior      descending.  5. High-grade lesion in the native coronary artery that provides flow      to a large posterolateral system with successful balloon      dilatation.  6. Interactive intravascular ultrasound for stent deployment as noted      in the above text.   DISPOSITION:  The patient will continue to follow up with Dr. Excell Seltzer.  He will require aspirin and Plavix for a minimum of one year, and  potentially longer.  I have reviewed the findings with Dr. Excell Seltzer.      Arturo Morton. Riley Kill, MD, [facility]  Electronically Signed     TDS/MEDQ  D:  01/14/2007  T:  01/15/2007  Job:  [id number]   cc:   Arturo Morton. Riley Kill, MD, [facility]  Veverly Fells. Excell Seltzer, MD  Cecil Cranker, MD, [facility]

## 2010-11-03 NOTE — Assessment & Plan Note (Signed)
[name]                            CARDIOLOGY OFFICE NOTE   BANKS, CHAIKIN                      MRN:          [id number]  DATE:01/25/2007                            DOB:          [birth year]    [name] is a 65 year old gentleman with advance coronary artery  disease and recent percutaneous coronary intervention, who presents  today for hospital followup.  He was admitted July 23 through July 26  with unstable angina.  He has had prior bypass surgery and multiple  stents placed.  His native right coronary artery was treated with a drug-  eluting stent during his most recent admission.  The lesion in his right  coronary artery was moderate and relatively stable, but with his ongoing  symptoms, we elected to proceed with PCI.  Dr. Riley Kill performed the  intervention and the patient tolerated the procedure well.  Unfortunately, since his return home, he has continued to have a great  deal of chest pain and shortness of breath.  His chest pain is typical  for angina, as it occurs with activity and feels like pressure across  his chest.  He also becomes shortness of breath with that.  He complains  of a cough and also of orthopnea.  His weight has not changed and he  does not complain of edema.  He has been taking his medications reliably  and seems to be tolerating them well.   CURRENT MEDICATIONS:  1. Lipitor 80 mg daily.  2. Zetia 10 mg daily.  3. Imdur 60 mg daily.  4. Diovan 160 mg daily.  5. Zoloft 100 mg daily.  6. Lantus 80 units daily.  7. Humalog 10 units three times daily.  8. Plavix 75 mg daily.  9. Lasix 40 mg daily.  10.Aspirin 325 mg daily.  11.Coreg 25 mg twice daily.   PHYSICAL EXAMINATION:  He is alert and oriented and in no acute  distress.  His weight is 243 pounds.  Blood pressure is 110/70, heart rate 68,  respiratory rate 16.  HEENT:  Normal.  NECK:  Normal carotid upstrokes without bruits.  Jugular venous  pressure  is normal.  LUNGS:  Clear to auscultation bilaterally.  CARDIOVASCULAR:  The apex is not palpable.  The heart is regular rate  and rhythm without murmurs or gallops.  The heart sounds are distant.  ABDOMEN:  Soft, obese, nontender.  No organomegaly.  EXTREMITIES:  There is trace bilateral pretibial edema, no clubbing or  cyanosis.  Peripheral pulses are intact and equal.   EKG:  Normal sinus rhythm with poor R-wave progression across the  precordium and nonspecific T-wave changes.   ASSESSMENT:  [name] is a 65 year old gentleman with the following  cardiac issues:  1. Coronary artery disease.  He certainly has advanced coronary artery      disease.  He recently underwent percutaneous intervention of the      native right coronary artery.  There was a moderately severe lesion      in the right coronary artery that was treated with a Promus drug-  eluting stent.  The vessel turned out to be a very large vessel, as      Dr. Riley Kill performed the intervention with guidance of      intravascular ultrasound.  The stent was dilated up to 5 mm and had      an excellent result.  His remaining coronary and graft anatomy was      stable and there were no other areas approachable via percutaneous      intervention.  Unfortunately, [name] continues to have angina      and signs suggestive of diastolic congestive heart failure.  Of      note, his filling pressures were elevated at the time of his      catheterization.  I have recommended the following changes to his      medical regimen:  An increase in Imdur from 60 to 90 mg and an      increase in Lasix from 40 to 80 mg daily.  This was reviewed with      the patient in detail and prescriptions were given to him.  He is      to call back next week, or we will contact him if we do not hear      from him by midweek.  If he continues to be symptomatic, we will      increase his Lasix further, as I suspect he has significant       elevation in his diastolic filling pressures, based on his      symptoms.  A basic metabolic panel will also be checked next week.  2. Diastolic congestive heart failure.  He currently is increasingly      symptomatic.  Changes as noted above with increase in Imdur and      increase in Lasix.  We will follow BUN and creatinine closely.      Continue Coreg 25 mg twice daily as well as Diovan.  3. Diabetes.  His hemoglobin A1c in the hospital was greater than 10.      He is on insulin and is under the care of Dr. Sol Passer.  We will defer      treatment to Dr. Sol Passer.   I would like to see [name] back in approximately 1 month.  Again,  we will contact him by telephone next week to see if he is making any  progress with regard to his symptoms.     [name]. Raymond Seltzer, MD  Electronically Signed    MDC/MedQ  DD: 01/25/2007  DT: 01/26/2007  Job #: [id number]   cc:   Dina Rich

## 2010-11-03 NOTE — Assessment & Plan Note (Signed)
[facility]                            CARDIOLOGY OFFICE NOTE   [name], [name]                      MRN:          [id number]  DATE:07/18/2007                            DOB:          [birth year]    [name] was seen in follow-up at the [facility] office on  July 18, 2007.  [name] is a 65 year old gentleman with coronary  artery disease status post CABG, diastolic heart failure, and chronic  chest pain.  [name] has undergone multiple catheterization  procedures for evaluation of his chronic recurrent chest pain.  In the  past year [name] has undergone saphenous vein graft stenting as well as  stenting of the native right coronary artery.  Unfortunately, [name] has not  had much symptomatic improvement after his PCI procedures.  [name] continues  to have dyspnea with fairly minimal exertion.  At rest, [name] has no  complaints.  Even with walking on flat ground around his home, [name]  develops chest pressure and dyspnea.  [name] has mild orthopnea and no PND.  [name] has had mild edema as well.   CURRENT MEDICATIONS:  1. Lipitor 80 mg daily.  2. Zetia 10 mg daily.  3. Diovan 160 mg daily.  4. Zoloft 100 mg daily.  5. Lantus 80 units daily.  6. Humalog 10 units t.i.d. on a sliding scale.  7. Plavix 75 mg daily.  8. Aspirin 325 mg daily.  9. Coreg 25 mg twice daily.  10.Imdur 90 mg daily.  11.Lasix 80 mg b.i.d.   ALLERGIES:  No known drug allergies.   EXAM:  The patient is alert and oriented.  [name] is in no acute distress.  Weight is 263.  Blood pressure 126/75, heart rate 70, respiratory rate  16.  HEENT:  Normal.  NECK:  Normal carotid upstrokes without bruits.  Jugular venous pressure  is normal.  LUNGS:  Clear bilaterally.  CARDIAC:  The apex is discrete and nondisplaced.  Heart is regular rate  and rhythm with a 2/6 systolic ejection murmur along the left sternal  border.  There are no diastolic murmurs or gallops.  ABDOMEN:  Soft,  obese, nontender, no organomegaly.  EXTREMITIES:  There is trace pretibial edema bilaterally.  Pedal pulses  are 2+ and equal throughout.  SKIN:  Warm and dry without rash.   An echocardiogram was performed in the office.  Results are currently  pending.  Lab work was drawn today as well.  Results of this are also  pending.   ASSESSMENT:  1. Coronary artery disease, status post coronary artery bypass      grafting.  [name] has stable symptoms.  His symptoms are      clearly out of proportion to his obstructive coronary artery      disease as his most recent percutaneous coronary intervention      procedures have really resulted in minimal symptomatic improvement.      [name] does have the distal small-vessel disease that may contribute to      some of his symptoms.  I will continue his current medical therapy  for now.  This includes dual antiplatelet therapy with aspirin and      Plavix, a long-acting nitrate, and beta blockade as well as statin      therapy.  2. Diastolic heart failure.  [name] most significant limitation      relates to chronic dyspnea that has been attributed to the      diastolic heart failure.  [name] has had elevated filling pressures at      the time of his catheterization procedures.  At his catheterization      in November 2007, his left ventricular end-diastolic pressure was      41 mmHg.  On repeat catheterization with aggressive medical      therapy, his end-diastolic pressure was in the normal range but his      systolic blood pressure was only 88 at that time.  [name] has had      Doppler parameters by echocardiography consistent with high filling      pressures as well.  [name] is on aggressive therapy with Lasix, Imdur,      Diovan and Coreg and continues to be markedly symptomatic.  I      discussed this case with Dr. Gala Romney, and I am going to refer him      for consideration of the enrollment in the CardioMEMS device trial.      This is a  pulmonary artery pressure monitoring device to aid in      outpatient treatment of diastolic heart failure.   For follow-up, I will see [name] back in approximately 8 weeks.     Raymond Fells. Excell Seltzer, MD  Electronically Signed    MDC/MedQ  DD: 07/18/2007  DT: 07/19/2007  Job #: [id number]   cc:   Bevelyn Buckles. Bensimhon, MD  Dina Rich

## 2010-11-03 NOTE — Assessment & Plan Note (Signed)
[facility]                            CARDIOLOGY OFFICE NOTE   SHARON, STAPEL                      MRN:          [id number]  DATE:08/28/2007                            DOB:          08/28/1945    [name] returns for followup at the [facility] Cardiology office on  August 28, 2007.  [name] is a very nice 65 year old gentleman with  coronary artery disease and diastolic heart failure.  He has chronic  dyspnea and chest pain that have been out of proportion to his objective  findings.  However, he has had periods of markedly elevated left  ventricular diastolic pressures.  He was referred to Dr. Gala Romney for  consideration of CardioMEMS for consideration of a CardioMEMS pulmonary  artery sensor to help manage his volume status.  However, [name]  has decided not to move forward with that evaluation, as he is currently  feeling better.   [name] he has cut way back on his food intake, and has been able to  lose 15 pounds since his visit in January 2009.  He is really feeling  much better at this time.  He continues to have dyspnea, but it is much  less than his baseline.  He has no orthopnea, PND, or edema.  He has had  a few chest pains, but again much less than his normal frequent left-  sided chest pain.   MEDICATIONS:  1. Imdur 30 mg daily.  2. Lasix 40 mg daily.  3. Lipitor 80 mg daily.  4. Zetia 10 mg daily.  5. Diovan 160 mg daily.  6. Zoloft 100 mg daily  7. Lantus 80 units daily.  8. Humalog 10 units t.i.d.  9. Plavix 75 mg daily.  10.Aspirin 325 mg daily.  11.Coreg 25 mg twice daily.   PHYSICAL EXAMINATION:  GENERAL:  The patient is alert and oriented.  He  is in no acute distress.  VITAL SIGNS:  Weight is 249, blood pressure 130/72, heart rate 68,  respiratory rate 12.  HEENT:  Normal.  NECK:  Normal carotid upstrokes. without bruits.  Jugular venous  pressure is normal.  LUNGS:  Clear bilaterally.  HEART:   Regular rate and rhythm, without murmurs or gallops.  ABDOMEN:  Soft, nontender, obese.  No organomegaly.  EXTREMITIES:  No clubbing, cyanosis or edema.  Peripheral pulses 2+ and  equal throughout.   ASSESSMENT:  1. Chronic diastolic heart failure.  [name] is much improved.      Continue current medical regimen.  He has decreased his Lasix dose      on his own, and he appears to be euvolemic on exam.  No changes      were made in medications.  2. Coronary artery disease, status post capture multiple percutaneous      coronary intervention procedures.  As reviewed previously, he has      really had little symptomatic improvement since his latest coronary      stents were placed.  We will continue his medical regimen, which      includes dual antiplatelet therapy  with aspirin and Plavix, as well      as aggressive lipid lowering with Lipitor and Zetia.  3. Hypertension.  Continue current medications.  4. Dyslipidemia.  Followed by Dr. Sol Passer.  Remains on high-dose Lipitor      well as Zetia.   For followup, I would like to see [name] back in 3 months.  I have  applauded his efforts at weight loss.  He is going to try to increase  his exercise now that he is able to do more without shortness of breath.     Veverly Fells. Excell Seltzer, MD  Electronically Signed    MDC/MedQ  DD: 08/28/2007  DT: 08/29/2007  Job #: [id number]   cc:   Dina Rich

## 2010-11-06 NOTE — Discharge Summary (Signed)
[place]  Patient:    [name], [name]                      MRN: [id number] Adm. Date:  [id number] Disc. Date: 06/30/00 Attending:  Junious Silk Dictator:   Abelino Derrick, P.A.C. [facility]                  Referring Physician Discharge [place]  DISCHARGE DIAGNOSES: 1. Coronary disease, catheterization this admission for chest pain showing    patent stent sites but distal posterior descending artery disease, to be    treated medically. 2. Known coronary disease, status post coronary artery bypass grafting in 1993    with stenting to the saphenous vein graft to obtuse marginal in June 2001    and stenting of the native right coronary artery in July 2001. 3. Insulin-dependent diabetes. 4. Treated hypertension. 5. Treated hyperlipidemia. 6. Carotid artery bruits.  HOSPITAL COURSE:  Patient is a 65 year old male followed by Dr. Glennon Hamilton and [facility].  He has a history as noted above.  He presented to the emergency room June 27, 2000 with recurrent pain, "just like before." On exam in the emergency room, he had bilateral carotid bruits.  He has apparently not had this worked up previously.  His Glucophage was held and he was put on IV nitro and Lovenox.  He was set up for cardiac catheterization, which was done June 28, 2000 by Dr. Riley Kill.  This revealed patent stent sites to the native RCA and SVG to OM and 90% diffuse PDA disease after the graft insertion.  He was ambulated and transferred to the floor.  CKs were slightly elevated at 275 but MBs were negative and troponins were negative. He did have cough and he was started on a Z pack for bronchitis.  Dr. Jens Som feels he can be discharged June 30, 2000.  He has a previous appointment with Dr. Corinda Gubler on January 15 and he will keep that.  I have set him up for carotid artery Dopplers to be done January 14.  LABORATORY DATA:  White count 10.4, hemoglobin 13.6,  hematocrit 41, platelets 256.  Sodium 135, potassium 4.4, BUN 20, creatinine 1.2.  INR 0.8.  Amylase and lipase are within normal limits.  CKs as noted.  UA was unremarkable.  His EKG in the emergency room showed sinus rhythm, septal Qs.  DISPOSITION:  Patient is discharged in stable condition.  FOLLOW-UP:  With Dr. Corinda Gubler on January 15.  He will have Dopplers done on January 14. DD:  06/30/00 TD:  06/30/00 Job: [id number]

## 2010-11-06 NOTE — Cardiovascular Report (Signed)
[place]  Patient:    JAMAN, ARO                      MRN: [id number] Proc. Date: 11/27/99 Adm. Date:  [id number] Disc. Date: [id number] Attending:  Nathen May CC:         [facility]             E. Graceann Congress, M.D. [facility]                        Cardiac Catheterization  PROCEDURES:  Cardiac catheterization and X-Sizer thrombectomy and stent.  INDICATIONS:  Mr. Sizelove is 65 years old and has had a re-do bypass surgery in 1999 at which time he had an old LIMA to the LAD and a new free RIMA to the intermediate branch of the circumflex, a new vein to the circumflex and a new sequential vein to the posterior descending and posterolateral branches of the right coronary artery.  He was admitted recently with recurrent chest pain. His CKs were negative.  DESCRIPTION OF PROCEDURE:  The procedure was performed via the right femoral artery using an arterial sheath and 6 French preformed coronary catheters.  We used a left bypass graft catheter for engaging the circumflex graft and a lima catheter for engaging a LIMA graft.  After completion of the diagnostic study we recognized there was a tight stenosis in the vein graft in the circumflex marginal vessel and made plans for intervention.  There was also a occlusion in the distal limb of the vein graft to the posterior descending and posterolateral branches of the right coronary artery but we elected to treat this initially medically.  The patient was given weight-adjusted heparin to prolong the ACT greater than 200 seconds and was given double bolus Integrilin and infusion.  The patient was consented for the X-TRACT protocol and was randomized to the X-Sizer catheter.  We used a AL-1 8 [place] guiding catheter and a long Extra Support wire and crossed the lesion without difficulty.  We used a 2.0 X-Sizer catheter and made three passes across the lesion.  There was  TIMI-3 flow before and after the passing of the X-Sizer.  We then stented with a 4.0, 18 mm Ultra stent deploying this with one inflation of 15 atmospheres for 45 seconds.  Repeat diagnostic studies were then performed though the guiding catheter.  The patient tolerated the procedure well and left the laboratory in satisfactory condition.  RESULTS:  The aortic pressure was 136/79 with a mean of 100.  Left ventricular pressure was 136/30.  The left main coronary artery:  The left main coronary artery was free of significant disease.  Left anterior descending:  The left anterior descending artery was completely occluded at its origin.  Circumflex artery:  The circumflex artery gave rise to an intermediate branch and then was completely occluded.  The intermediate branch had 80% proximals stenosis and competing flow distally.  Right coronary artery:  The right coronary was a moderate sized vessel that gave rise to a marginal branch, a posterior descending and two posterolateral branches.  There were 40% and 70% distal stenoses in the right coronary artery, 70% stenosis in the proximal portion of the posterior descending branch with competing flow distally, and 80% stenosis in the distal right coronary artery after the posterior descending branch before the posterolateral branch.  The saphenous vein graft to the posterior  descending and posterolateral branch of the right coronary artery was patent in its proximal limb but occluded in its distal limb.  There was 80% stenosis in the posterior descending branch at the insertion site.  The saphenous vein graft to the marginal branch of the circumflex artery was patent but there was a 90% stenosis in its mid and distal portion.  The free RIMA graft arose from the vein graft to the marginal branch and supplied the intermediate branch and was patent and functioned normally.  The LIMA graft to the LAD was patent and functioned  normally.  LEFT VENTRICULOGRAPHY:  The left ventriculogram was performed in the RAO projection showed akinesis of the anterolateral wall.  The overall wall motion was good with an estimated ejection fraction of 50%.  Following use of the X-Sizer catheter the stenosis improved from 90% to 70%. There was a type A linear dissection following use of the X-Sizer catheter. Following stenting, the stenosis improved to 0% and there was no residual dissection.  TIMI flow was TIMI-3 before and after intervention.  CONCLUSIONS: 1. Coronary artery disease, status post re-do coronary bypass surgery in    1999 with total occlusion of the left anterior descending artery and    circumflex arteries and 70% and 80% stenoses in the distal right coronary    artery.  There is a occlusion of the distal limb of the sequential vein    graft to the posterior descending and posterolateral branch of the    right coronary artery, 90% stenosis in the vein graft to the circumflex    marginal vessel, a patent free right internal mammary artery graft to the    intermediate branch of the circumflex artery, and a patent left internal    mammary artery graft to the left anterior descending.  The left    ventriculogram shows anterolateral wall hypokinesis. 2. Successful thrombectomy with the X-Sizer catheter and successful stenting    of the saphenous vein graft to the marginal branch of the circumflex artery    with improvement in percent diameter narrowing from 90% to 0%.  DISPOSITION:  The patient was returned to the postangioplasty unit.  We will plan initial medical treatment of the distal right coronary artery disease and plan a stress Cardiolite as an outpatient in the next few weeks.  If there is ischemia in the inferolateral region, then we might consider intervention through the native vessel to the posterolateral branch. DD:  11/27/99 TD:  12/02/99 Job: [id number]

## 2010-11-06 NOTE — Consult Note (Signed)
NAME:  [name], [name] NO.:  [id number]   MEDICAL RECORD NO.:  [id number]                   PATIENT TYPE:  INP   LOCATION:  3705                                 FACILITY:  [facility]   PHYSICIAN:  Veneda Melter, M.D.                   DATE OF BIRTH:  1945/12/13   DATE OF CONSULTATION:  01/07/2003  DATE OF DISCHARGE:                                   CONSULTATION   HISTORY OF PRESENT ILLNESS:  [name] is a 65 year old white male who has  diabetes mellitus, hypertension, dyslipidemia, obesity, and coronary artery  disease who presents for assessment of substernal chest discomfort.  The  patient has undergone coronary artery bypass graft surgery in 1993 with redo  in 1999.  He has had cardiac catheterizations performed in 2002 and 2003  showing native severe three vessel coronary artery disease and many patent  bypass grafts.  He has had mild to moderate LV dysfunction.  He had been  doing well until the past several months when he noted the increase in  frequency and severity of substernal chest discomfort.  He has run out of  his Imdur and subsequently stopped taking it.  In June, he was seen by Dr.  Ronne Binning who advised hospital admission for his crescendo symptoms and he  refused.  He presents today to the clinics with continued chest discomfort  and was subsequently admitted.  He has noted 10/10 chest pain at rest or  with exertion.  It has been associated with shortness of breath and  occasional nausea.  He has also noted some shortness of breath and  increasing dyspnea on exertion.  He does admit to a weight gain of 25 pounds  over the last six months.  He has not had any syncope or presyncope.  He has  noticed some swelling in his legs.  He has had some chronic back pain.  He  denies any claudication.  No constitutional symptoms.   REVIEW OF SYSTEMS:  Otherwise noncontributory.   PAST MEDICAL HISTORY:  1. Obesity.  2. Diabetes mellitus, insulin  requiring.  3. Pulmonary hypertension.  4. Dyslipidemia.  5. Coronary artery disease, status post surgical revascularization in 1993     and 1999.  Catheterizations in 2002 and 2003 showed severe native three     vessel coronary artery disease, patent LIMA to the LAD, vein graft to the     posterior descending coronary artery and vein graft to left circumflex     artery with free RIMA that arises from the left circumflex artery and     continues to intermediate artery.  6. Left carotid endarterectomy as well as gastric stapling and reversal.   ALLERGIES:  LASIX causes cramps.  NORVASC and AVANDIA causes edema.   CURRENT MEDICATIONS PRIOR TO ADMISSION:  1. Toprol XL 100 mg daily.  2. Accupril 20 mg daily.  3. Lasix  20 mg daily.  4. Aspirin 81 mg daily.  5. Insulin 70/30 with 70 units q.a.m. and 65 units q.p.m.  6. Lipitor 20 mg a day.  7. Glucophage 2 g daily.  8. Nitroglycerin as needed.   SOCIAL HISTORY:  The patient lives in [place] with his wife.  He is  disabled.  He denies current alcohol or tobacco use.   FAMILY HISTORY:  Mother alive at age 47 without coronary disease.  Father  died at the age of 42 with coronary disease.  One brother alive and well  with coronary disease.   PHYSICAL EXAMINATION:  GENERAL APPEARANCE:  He is an obese white male in no  acute distress.  VITAL SIGNS:  Temperature 96.4, blood pressure 162/89, pulse 74, respiratory  rate 20, O2 saturation 96% on room air.  Weight is 272 pounds.  HEENT:  Pupils are equal, round, reactive to light and accommodation.  Extraocular muscles are intact.  Oropharynx shows no lesion.  NECK:  Supple. There is a soft left-sided bruit.  No adenopathy.  LUNGS:  Show diminished breath sounds bilaterally, otherwise clear.  CARDIOVASCULAR:  Regular rate with systolic ejection murmur.  ABDOMEN:  Protuberant and nontender.  EXTREMITIES:  There is 1+ pedal edema.  NEUROLOGIC:  Motor strength is 5/5.  Sensory is intact to  touch.  Cranial  nerves II-XII intact.   ECG shows normal sinus rhythm at 78 with evidence of an old anterior septal  myocardial infarction but no acute ST changes.   LABORATORY DATA:  White count 8.9, hemoglobin 13.7, hematocrit 39.8,  platelets 242.  Sodium 135, potassium 4.7, chloride 106, bicarb 22, BUN 15,  creatinine 1.1, glucose 272.  CK 276, MB 9.7, troponin I 0.01.  INR 0.8, PTT  31.   ASSESSMENT AND PLAN:  [name] is a 65 year old gentleman with diabetes  mellitus, hypertension, dyslipidemia, and known coronary disease who  presents with two to three month history of crescendo angina.  This has been  compounded by medical noncompliance.  He has evidence of volume overload and  morbid obesity with weight gain.  He had pulmonary hypertension on prior  catheterization showing PA pressures of 40-60 mmHg.  He also has a systemic  hypertension as well.  Prior echocardiograms have suggested left ventricular  hypertrophy and he almost assuredly has a component of diastolic dysfunction  superimposed on his systolic dysfunction.   The patient is being admitted to the hospital and will be stabilized  medically with antianginals.  His blood pressure will be controlled and  serial cardiac enzymes obtained to rule out myocardial injury.  Question  will then become how best to treat him and will consider proceeding with a  stress imaging study to determine the location and extent  of ischemia if any.  Should the patient have positive enzymes or refractory  symptoms, cardiac catheterization may be indicated to reassess his anatomy  and rule out progression of disease.   Thank you for asking [place] to see [name]. We will follow with you.                                               Veneda Melter, M.D.    NG/MEDQ  D:  01/07/2003  T:  01/08/2003  Job:  [id number]   cc:   Thayer Headings, M.D.  Int. Med. - Resident - [facility]  [place], [place] [address]  Fax: [phone]  E. Graceann Congress,  M.D.   Charlies Constable, M.D.

## 2010-11-06 NOTE — Discharge Summary (Signed)
NAMESANJUAN, [name] NO.:  [id number]   MEDICAL RECORD NO.:  [id number]          PATIENT TYPE:  INP   LOCATION:  6523                         FACILITY:  [facility]   PHYSICIAN:  Veverly Fells. Excell Seltzer, MD  DATE OF BIRTH:  1946/05/03   DATE OF ADMISSION:  05/10/2006  DATE OF DISCHARGE:  05/12/2006                                 DISCHARGE SUMMARY   PRIMARY CARDIOLOGIST:  Cecil Cranker, MD, [facility]   PRINCIPAL DIAGNOSES:  1. Coronary artery disease progression.      a.     Status post bare metal stenting saphenous vein graft - obtuse       marginal graft.      b.     Otherwise patent grafts.      c.     Right coronary artery disease progression.      d.     Preserved left ventricular function.  2. Leukocytosis/low grade fever.  3. Mild congestive heart failure.   SECONDARY DIAGNOSES:  1. Hypertension.  2. Insulin-dependent diabetes mellitus.  3. Hyperlipidemia.  4. Obesity.   REASON FOR ADMISSION:  [name] is a 65 year old male, status post  coronary artery bypass grafting 1993 with redo CABG 1999, who presented to  the office with symptoms worrisome for unstable angina pectoris.   HOSPITAL COURSE:  The patient presented for elective coronary angiography,  performed by Dr. Bonnee Quin (see cath report for full details), revealing a  high-grade lesion of the obtuse marginal graft, successfully treated with  bare metal stenting by Dr. Tonny Bollman, with no noted complications.   The patient was kept for overnight observation, and had no complaint of  chest pain.  There were no complications at the right groin incision site,  although there was some laceration of the skin but no obvious evidence of  infection.  The patient did have mildly elevated temperature (99.3) which  had decreased to 98 by time of discharge.  Of note, his white count rose was  from within normal limits to 13.4 at time of discharge.  This will be  followed with an early repeat CBC in the  next several days.   Medication adjustments:  The patient will be placed on Plavix for a month,  in addition to increase dose of aspirin.  He is to otherwise resume all  previous home medications.   DISCHARGE MEDICATIONS:  1. Plavix 75 mg daily (times 1 month).  2. Coreg 25 mg b.i.d.  3. Lasix 40 mg b.i.d.  4. Imdur 60 mg daily.  5. Lipitor 80 mg daily.  6. Zetia 100 mg daily.  7. Lantus as directed.  8. Humalog sliding scale as directed.  9. Coated aspirin 325 mg daily.  10.Diovan 160 mg b.i.d.  11.Zoloft 100 mg daily.  12.K-Dur 20 mEq daily.  13.Nitrostat 0.4 mg as directed.   INSTRUCTIONS:  Please refer to cardiac catheterization discharge sheet for  full instructions.   FOLLOW UP:  Arrangements will be made for patient to follow-up with Dr. Howard Pouch. [place] in 2 weeks.  We will also arrange for him  to have a repeat CBC on  Monday, November 26.   DISPOSITION:  Stable.      Gene Serpe, PA-C      Veverly Fells. Excell Seltzer, MD  Electronically Signed    GS/MEDQ  D:  05/12/2006  T:  05/12/2006  Job:  [id number]   cc:   Dina Rich

## 2010-11-06 NOTE — Cardiovascular Report (Signed)
NAMESHREY, BOIKE NO.:  [id number]   MEDICAL RECORD NO.:  [id number]          PATIENT TYPE:  OIB   LOCATION:  2899                         FACILITY:  [facility]   PHYSICIAN:  Arturo Morton. Riley Kill, MD, FACCDATE OF BIRTH:  1946/05/12   DATE OF PROCEDURE:  05/10/2006  DATE OF DISCHARGE:                            CARDIAC CATHETERIZATION   INDICATIONS:  Mr. Dissinger is a 65 year old gentleman with diabetes.  He  has had prior revascularization surgery.  He now presents with recurrent  anginal type symptoms.  He has also had an intervention of the saphenous  vein graft to the OM previously.  Current study is done to assess  coronary anatomy.   PROCEDURES:  1. Left heart catheterization  2. Selective coronary arteriography.  3. Selective left ventriculography.  4. Saphenous vein graft angiography.  5. Selective left internal mammary angiography.   DESCRIPTION OF PROCEDURE:  The patient was brought to the cath lab and  prepped and draped in the usual fashion.  Through an anterior puncture,  the right femoral artery is easily entered and a and 5-French sheath was  placed.  Views of the left and right coronaries were obtained.  Central  aortic and left ventricular pressures were measured with a pigtail.  Ventriculography was performed in the RAO projection.  Vein graft and  internal mammary angiography were performed with standard catheters.  Overall, he tolerated the procedure well but did develop some nausea at  the completion of the procedure.  He was given Zofran.  His LVEDP was  noted to be elevated.   He was taken to the holding area in satisfactory clinical condition.   HEMODYNAMIC DATA:  1. Central aortic pressure 160/89, mean 123.  2. Left atrial pressure 186/41.  3. No gradient on pullback across the aortic valve.   ANGIOGRAPHIC DATA:  1. Left main is free of critical disease.  2. The LAD is totally occluded.  3. Internal mammary to the LAD remains widely  patent.  There is about      40% narrowing with lumpy-bumpy irregularity in the proximal to mid      portion of the IMA but this is similar to the previous study.  4. The ramus intermedius has an 80% proximal lesion.  5. The circumflex has 80% proximal narrowing that is totally occluded.  6. There is a vein graft that goes to the OM.  This shows a      progressive lesion 80% distally that is slightly hazy.  There is a      free RIMA that comes off the vein graft to the OM that goes to the      intermediate, free RIMA appears to be widely patent.  7. The native right coronary has 60 and 80% lesions proximal and mid      vessel.  There is then what appears to be fairly tight proximal PDA      disease and the vein graft distally is intact.  The right supplies      posterolateral branch.  The vein graft to the PDA has about 40%  narrowing then there is a 70% lesion distally in the PDA.   Ventriculography in RAO projection reveals hypokinesis of the inferior  wall but overall preserved LV systolic function with elevated LVEDP is  noted above.   CONCLUSION:  1. Patent internal mammary to the left anterior descending with      abnormality in the mid portion of this graft but without flow      limiting disease as noted above.  2. Continued patent, free right internal mammary artery to the      intermediate vessel.  3. Patent saphenous vein graft to the posterior descending artery, but      perhaps some compromise of the posterolateral system.  4. Progression of right coronary artery disease.  5. High-grade lesion in the saphenous vein graft to the obtuse      marginal.   We will try to diurese the patient, get his LVEDP down.  He will  probably need PCI of the saphenous vein graft, we will need to load him  with Plavix.  This will be discussed with the patient and his family and  the timing depend upon the stability of his creatinine.      Arturo Morton. Riley Kill, MD, [facility]   Electronically Signed     TDS/MEDQ  D:  05/10/2006  T:  05/10/2006  Job:  [id number]   cc:   Cecil Cranker, MD, [facility]  CV Lab

## 2010-11-06 NOTE — Discharge Summary (Signed)
[place]. [facility]  Patient:    [name], [name]                      MRN: [id number] Adm. Date:  [id number] Disc. Date: 11/28/99 Attending:  Nathen May Dictator:   Delton See, P.A. CC:         Cecil Cranker, M.D. [facility]             Dr. Tawni Millers, c/o [facility]                  Referring Physician Discharge [place]  HISTORY OF PRESENT ILLNESS:  [name] is a 65 year old male with a history of coronary artery disease, status post coronary artery bypass graft surgery in September 1993.  He had a repeat catheterization in February 1999 and medical treatment was recommended at that time.  He had chest pain in March 1999 and ultimately underwent redo coronary artery bypass graft surgery with a RIMA to the intermediate, a saphenous vein graft to the circumflex, saphenous vein graft to the PD and PL.  He had a LIMA to the LAD that was patent from 1993.  A Cardiolite was performed for chest pain in June 1999 that showed no ischemia.  The patient had not been seen since that time.  The patient was recently referred for chest pain.  He was admitted to the hospital for further evaluation.  PAST MEDICAL HISTORY:  Significant for elevated lipids; insulin-dependent diabetes mellitus; history of "weight loss surgery," question stomach stapling.  He also has the above-noted coronary artery disease.  ALLERGIES:  No known drug allergies.  SOCIAL HISTORY:  The patient does not smoke or use alcohol.  HOSPITAL COURSE:  As noted, this patient was admitted to [facility] November 26, 1999 for evaluation of chest pain with known coronary artery disease. The patient underwent cardiac catheterization on November 27, 1999, performed by Dr. Charlies Constable.  The LAD was totally occluded.  The circumflex was totally occluded.  The RCA had 70 and 80% lesions.  The saphenous vein graft to the PD and PL was totally occluded at the distal  limb.  The LIMA to the LAD was okay. The free RIMA to the intermediate was okay.  The saphenous vein graft to the obtuse marginal had a 90% mid lesion.  The left ventricle showed anterolateral akinesis with an EF of 50%.  The patient underwent PTCA stenting of the saphenous vein graft to the circumflex artery, reducing the lesion from 90% to 0%.  Arrangements were made to discharge the patient the following day. It was recommended the patient be on Plavix for three weeks.  However, the patient said he could not afford this medication, and arrangements are being made to have the case manager see the patient for assistance in medications.  He also requested an Accu-Chek machine and we will see if one can be made available.  It was recommended that the patient have a follow-up Cardiolite in eight weeks to evaluate his residual disease.  LABORATORY DATA:  On June 7, ABGs revealed a pH 7.389, PCO2 34, PO2 110, O2 saturation 98% - this was on 2 liters per minute.  A CBC revealed hemoglobin 13, hematocrit 37.3, wbcs 11.2.  Platelets 274,000.  Chemistries on June 7 revealed a BUN 20, creatinine 1.2, potassium 4.2, glucose 293, albumin 3.3. The remainder was within normal limits.  On June 7, CK-MB enzymes revealed a  CK of 211, MB 5.4, index 2.6, troponin I was less than 0.03.  A chest x-ray showed cardiomegaly but no congestive heart failure.  EKG showed normal sinus rhythm with possible anterior infarct, age undetermined.  DISCHARGE MEDICATIONS: 1. Plavix 75 mg each day for three weeks. 2. Insulin 70/30 35 units a.m., 45 units p.m. 3. Toprol XL 200 mg daily. 4. Accupril 30 mg daily. 5. Lipitor 20 mg daily. 6. Lasix 40 mg daily. 7. Aldactone 25 mg daily. 8. Coated aspirin 325 mg daily. 9. Nitroglycerin p.r.n. for chest pain.  ACTIVITY:  The patient was told to avoid any strenuous activity or driving for two days.  DIET:  He was to be on a low salt/low fat diabetic diet.  WOUND CARE:   He was told to call the office if he had any increased pain, swelling, or bleeding from his groin.  FOLLOW-UP:  The patient was to have an outpatient Cardiolite stress test in eight weeks.  He was to follow up with Dr. Corinda Gubler or the physician assistant in two to three weeks.  He was to see Dr. Verdis Frederickson as needed or as scheduled.  PROBLEM LIST AT TIME OF DISCHARGE: 1. Status post percutaneous transluminal coronary angioplasty stenting of the    saphenous vein graft to the circumflex artery, performed November 27, 1999 by    Dr. Juanda Chance, reducing lesion from 90% to 0%.  Ejection fraction at that    time 50%.  Outpatient Cardiolite recommended in eight weeks to evaluate    for residual disease. 2. Previous coronary artery bypass graft surgery in 1993 with redo in 1999.    Please see grafts as noted above. 3. Elevated lipids. 4. Insulin-dependent diabetes mellitus. 5. History of obesity. 6. History of hypertension with poor control. 7. Possible medical noncompliance secondary to financial issues. DD:  11/28/99 TD:  11/28/99 Job: [id number]

## 2010-11-06 NOTE — Discharge Summary (Signed)
[place]. [facility]  Patient:    [name], [name]                      MRN: [id number] Adm. Date:  [id number] Disc. Date: 12/22/99 Attending:  Alric Quan Dictator:   Rozell Searing, P.A.                           Discharge Summary  ADDENDUM:  Following review of medications at time of discharge, several changes were noted to have been made during this recent stay. Apparently, the patient had been taking Toprol XL 200 mg q.d., Accupril 10 mg q.d., Lasix 40 mg q.d., and Aldactone 25 mg q.d. prior to this admission. The Toprol was initially dosed at 100 mg q.d. and subsequently up titrated to the final dose of 150 mg b.i.d. by time of discharge. Both the Lasix and Aldactone were discontinued on admission and not resumed prior to discharge. The Accupril was initiated at 30 mg q.d. with substitution by Altace 3.75 mg b.i.d. during the brief stay. Given these various adjustments, the patient was instructed at time of discharge to continue on the same medications which he had been taking at time of discharge and to review those which had been discontinued at time of follow-up in the office. The patient has been instructed to bring all of his medications with him at time of office follow-up. Further adjustments will be made at that time.DD:  12/22/99 TD:  12/22/99 Job: [id number]

## 2010-11-06 NOTE — Cardiovascular Report (Signed)
[name], [name]                         ACCOUNT NO.:  [id number]   MEDICAL RECORD NO.:  [id number]                   PATIENT TYPE:  INP   LOCATION:  2929                                 FACILITY:  [facility]   PHYSICIAN:  [name]. [name], M.D.             DATE OF BIRTH:  October 11, 1945   DATE OF PROCEDURE:  07/10/2003  DATE OF DISCHARGE:                              CARDIAC CATHETERIZATION   INDICATIONS:  [name] is a 65 year old gentleman with known prior bypass  x2.  He presented with acute pulmonary edema and non-ST-elevation myocardial  infarction.  He was brought to [facility] and is here for further  evaluation.   He was seen by Dr. Juanda Woodard in the morning, who felt that he needed prompt  cardiac catheterization.  He was brought to the laboratory for further  evaluation.  He does have a modestly elevated creatinine at 1.7.   PROCEDURE:  1. Left and right heart catheterization.  2. Selective coronary arteriography.  3. Selective left ventriculography.  4. Saphenous vein graft angiography x3.  5. Selective left internal mammary angiography.   DESCRIPTION OF PROCEDURE:  The patient was brought to the catheterization  laboratory and prepped and draped in the usual fashion.  We used the left  side because the right side had been used for placement of a venous  catheter.  Using a SMART needle we entered the right femoral vein, and an 8-  French sheath was placed.  Right heart catheterization was then performed  with a 7.5-French thermodilution Swan-Ganz catheter.  Following this the  SMART needle was used to enter the left femoral artery.  A 6-French sheath  was placed.  Pressures were obtained in the AO and LV, and then simultaneous  LV pulmonary capillary wedge pressures were obtained.  Following this,  ventriculography was performed in the RAO projection.  Minimal contrast was  used to try to reduce dye load.  Following this, coronary arteriography was  performed  with standard Judkins catheters.  We took multiple views of the  grafts.  I subsequently reviewed the films with the patient's family and  also with Dr. Loraine Leriche Woodard.  The patient was given intravenous labetalol  to bring down his blood pressure as well as nitroglycerin.  He was also  given 20 mg intravenous furosemide in the laboratory.  He was subsequently  taken to the holding area in satisfactory clinical condition.   HEMODYNAMIC DATA:  1. Right atrium 15.  2. Right ventricle 47/17.  3. Pulmonary artery 45/32.  4. Pulmonary capillary wedge 32.  5. Aortic 145/71, mean 106.  6. LV 142/32.  7. No significant aortic valve gradient.  8. No significant mitral valve gradient.  9. Superior vena cava saturation 61%.  10.      Pulmonary artery saturation 64%.  11.      Aortic saturation 94%.  12.      Fick cardiac output  5.2 L/min.  13.      Fick cardiac index 2.1 L/min per sq m.  14.      Thermodilution cardiac output 6.4 L/min.  15.      Thermodilution cardiac index 2.6 L/min per sq m.   ANGIOGRAPHIC DATA:  1. Ventriculography was performed in the RAO projection.  There was     ventricular ectopy.  A formal calculation ejection fraction was     approximately 41%.  However, the visual estimate was about 50%.  There     was anterolateral hypokinesis which had been noted on the previous study.  2. The left main is moderately irregular and leads into a left anterior     descending that has an 80% proximal stenosis and is a diffusely diseased     diabetic vessel.  3. The circumflex has 90% proximal narrowing, and then the marginal is     totally occluded.  4. The right coronary artery is a large-caliber vessel with multiple areas     of irregularity and disease.  There is 30% proximal narrowing followed by     a 50% area of narrowing in the junction of the proximal mid vessel.  In     the midportion of the mid vessel is a 70% eccentric plaque.  There is 40%     distally, and this leads  into a posterior descending which demonstrates     competitive filling.  There is a small posterolateral branch with 90%     proximal narrowing, and then the very large posterolateral branch has     luminal irregularity but no critical disease.  5. The internal mammary of the LAD has this area of 30-40% narrowing with     nodular irregularity in the midportion of the IMA.  This was seen on     previous studies.  However, there is brisk runoff into the left anterior     descending artery, and the LAD fills well.  6. There is a saphenous vein graft that goes to the obtuse marginal.  At the     top of this graft is a free right internal mammary graft that goes to the     diagonal branch.  The free right internal mammary graft is widely patent.     The vein graft to the marginal has a stent in its midportion.  There is     progression of disease in the mid distal section with 50-60% narrowing.     Compared to the last study, there is now haziness in the distal portion     of the graft as it runs off into the OM.  The OM fills retrograde, and     this area is segmentally and diffusely irregular and small.  It also     fills antegrade, and there is a hazy area that probably represents about     80-90% narrowing but could be just embolized thrombus.  The distal vessel     is extremely small.  7. The saphenous vein graft to the PDA is patent.  The second arm is     occluded but likely because of good flow into the native posterolateral     system.  The PDA itself has 90% narrowing in its distal-most aspect after     the graft insertion.   CONCLUSIONS:  1. Mild reduction in left ventricular function with an anterolateral wall     motion abnormality as noted on the previous study.  2. Moderate pulmonary hypertension with elevated pulmonary capillary wedge.  3. Continued patency of the internal mammary to the left anterior     descending. 4. Continued patency of the saphenous vein graft to the  posterior descending     artery but with distal disease in the posterior descending artery and     flow to the posterolateral system through the native artery     predominantly.  5. New evidence of thrombus of the distal-most aspect of the obtuse marginal     graft as it leads into a severely diseased obtuse marginal branch.  6. Continued patency of the free right internal mammary to the small     diagonal.   DISPOSITION:  I have reviewed the films with Dr. Gerri Spore.  It is a  difficult situation.  There is haziness in the distal portion of the OM  graft, and this is new from the previous study.  Importantly, the OM itself  is a severely diseased vessel with very poor runoff.  The best option for  this at the present time would likely be medical therapy  as there are very limited options from a percutaneous standpoint.  We will  likely treat him medically, but I will review this with my colleagues and  get more opinions.  At the present time Dr. Gerri Spore and I both feel that  intervention likely would only lead to distal embolization of clot into the  very small, diffusely diseased vessel.                                               [name]. [name], M.D.    TDS/MEDQ  D:  07/10/2003  T:  07/11/2003  Job:  [id number]

## 2010-11-06 NOTE — Discharge Summary (Signed)
[place]. [facility]  Patient:    [name], [name]                      MRN: [id number] Adm. Date:  [id number] Disc. Date: 12/22/99 Attending:  Alric Quan Dictator:   Rozell Searing, P.A. CC:         Tawni Millers, M.D.                           Discharge Summary  PROCEDURES:  Coronary angiogram, multiple stenting July 2.  REASON FOR ADMISSION:  The patient is a 65 year old male status post redo CABG 1999 who had been recently discharged following PTCA/stenting of saphenous vein graft to OM graft and who now presented with recurrent chest pain. He was admitted for rule out of MI and relook coronary angiogram.  LABORATORY DATA:  Cardiac enzymes:  CPK/MB and troponin I negative x 3. Lipid profile:  Cholesterol 112, triglyceride 163, HDL 31, LDL 48. INR of 1. Metabolic profile:  Elevated glucose 232. Hemoglobin 13, hematocrit 37.4, platelets 300, WBC 12 on admission. Hemoglobin A1C 9.9.  Admission chest x-ray:  No acute disease.  HOSPITAL COURSE:  Following admission, the patient ruled out for MI with negative serial cardiac enzymes. The patient was maintained on intravenous nitroglycerin and heparin with plans to proceed with relook coronary angiogram.  Cardiac catheterization performed on July 2 by Everardo Beals. Juanda Chance, M.D. (see catheterization report for details), revealed patent saphenous vein graft to PDA and saphenous vein graft to OM graft. Additionally, the free RIMA to RI was also patent. It was noted that the LIMA to LAD graft was patent by prior study. LVgram revealed anterolateral hypokinesia, EF of 50%  Native coronary arteries notable for 100% LAD, circumflex; 80% distal CA, 80% and 70% PL artery. Bruce Elvera Lennox Juanda Chance, M.D. proceeded with successful multiple stenting of the RCA/PL lesions. Specifically, stenting of the 70% PL to 0% residual stenosis, stenting of 80% distal AV to less than 10% residual stenosis, and stenting of the 80% distal  RCA lesion to less than 10% residual stenosis.  No complications were noted, and patient was cleared for discharge the following day after completion of the Integrilin infusion.  DISCHARGE MEDICATIONS:  1. Plavix 75 mg q.d.  2. Toprol XL 200 mg q.d.  3. Accupril 30 mg q.d.  4. Lipitor 20 mg q.h.s.  5. Lasix 20 mg q.d.  6. Aldactone 25 mg q.d.  7. Coated aspirin 325 mg q.d.  8. Insulin as previously directed.  9. Glucophage 1000 mg b.i.d. 10. Nitrostat as directed.  ACTIVITIES:  The patient is to refrain from any heavy lifting, exertion, sexual activity, or driving x 2 days.  DIET:  He is maintain a low-fat/cholesterol and ADA diet.  INSTRUCTIONS:  He is to call the office if there is any swelling or bleeding at the catheterization site.  FOLLOW-UP:  The patient will follow up with E. Graceann Congress, M.D. on July 17 at 4:15 p.m.  DISCHARGE DIAGNOSES:  1. Coronary artery disease progression.     a. Status post multiple stenting right coronary artery, PL (x 3), July 2.     b. Status post recent stent saphenous vein graft to OM graft.     c. Status post redo coronary artery bypass grafting, 1994.     d. Preserved left ventricular function (ejection fraction of 50%).  2. Type 2 diabetes mellitus.  3. Hyperlipidemia. DD:  12/22/99 TD:  12/22/99 Job: [id number]

## 2010-11-06 NOTE — H&P (Signed)
NAMEJACQUEL, [name] NO.:  [id number]   MEDICAL RECORD NO.:  [id number]                   PATIENT TYPE:  INP   LOCATION:  2929                                 FACILITY:  [facility]   PHYSICIAN:  Jonelle Sidle, M.D. [facility]        DATE OF BIRTH:  1946-02-19   DATE OF ADMISSION:  07/10/2003  DATE OF DISCHARGE:                                HISTORY & PHYSICAL   [place] CARDIOLOGIST:  Dr. Cecil Cranker.   CHIEF COMPLAINT:  Worsened chest pain and shortness of breath.   HISTORY OF PRESENT ILLNESS:  [name] is a 65 year old male with type 2  diabetes mellitus, dyslipidemia, peripheral vascular disease, status post  left carotid endarterectomy, hypertension and known coronary artery disease,  status post coronary artery bypass grafting in 1993 as well as 1999.  Last  coronary angiography in July of 2004 showed patent grafts (LIMA to the left  anterior descending, free RIMA to the diagonal, saphenous vein graft to the  OM, and saphenous vein graft to the PDA) with overall low-normal ejection  fraction.  He reports compliance with his medications but does adjust his  Lasix dose, as he is bothered by lower extremity cramping on his Lasix.  He  states that yesterday he developed fairly intense chest pressure at rest  associated with nausea and emesis and ultimately progressive shortness of  breath at rest with orthopnea.  He states that these symptoms were worse  than typical, but that he has been having a gradual progression to some  degree over the last week.  He presented to the [facility] Emergency  Department after being found by EMS to be fairly hypoxic and in respiratory  distress.  He was treated with intravenous Lasix and placed on BiPAP  transiently with improvement in symptoms.  At present, he is on nasal  cannula oxygen and is breathing much more comfortably, denying chest pain.  His initial cardiac markers at [facility] showed a CK of 288 with a  CK-MB of  10.8 and initial troponin I that was negative.  Electrocardiogram from the  18th showed normal sinus rhythm with evidence of previous anterior wall  myocardial infarction.  Repeat electrocardiogram now shows some T wave  inversion and flattening in the lateral leads, representing a mild change in  prior tracing.  No additional laboratory data is yet available for review.   ALLERGIES:  No clear drug allergies.  The patient reports intolerances to  LASIX due to leg cramping as well as NORVASC and AVANDIA which cause him  lower extremity edema.   MEDICATIONS:  Medications at present include:  1. Toprol-XL 100 mg p.o. b.i.d.  2. Accupril 40 mg p.o. daily.  3. Isordil 10 mg p.o. b.i.d.  4. Lasix 80 mg p.o. daily.  5. Glucophage 2000 mg p.o. daily.  6. Lipitor 40 mg p.o. daily.  7. Insulin 70/30 -- 75 units subcu q.a.m. and 65 units subcu q.p.m.  PAST MEDICAL HISTORY/SOCIAL HISTORY/FAMILY HISTORY:  Past medical history,  social history and family history were reviewed in our previous consultation  note from July of 2004.  There has been no major interval change.   REVIEW OF SYSTEMS:  Review of systems as described in history of present  illness.  The patient denies fever and chills.  He has had no productive  cough or hemoptysis.  Bowel habits have been normal.  He denies any  significant peripheral edema recently.  He has had no palpitations or  syncope.   EXAM:  VITAL SIGNS:  Temperature is 97 degrees.  Blood pressure is 110/60  with a heart rate of 65.  Oxygen saturation is 93% on 2 L nasal cannula.  The patient's weight is 278 pounds.  GENERAL:  In general, this is an obese male lying nearly supine in no acute  distress.  HEENT:  Conjunctivae are normal.  Oropharynx clear.  NECK:  Neck is supple without loud bruits or elevated jugular venous  pressure.  There is no thyroid tenderness noted or thyromegaly.  LUNGS:  Lungs exhibit a few crackles at the bases with no  wheezing or  rhonchi.  Respiratory effort is nonlabored at present.  CARDIAC:  Exam reveals a regular rate and rhythm with a soft systolic murmur  heard at the right base without radiation and no S3 gallop or pericardial  rub.  ABDOMEN:  The abdomen is obese without obvious hepatomegaly or bruits.  EXTREMITIES:  Extremities show trace edema.  Peripheral pulses are 1 to 2+.  SKIN:  No ulcer changes noted.  MUSCULOSKELETAL:  No kyphosis is noted.  NEUROPSYCHIATRIC:  The patient is alert and oriented x3.   LABORATORY DATA:  Laboratory data is currently pending.  Outside laboratory  data from [place] showed an initial blood gas on BiPAP with a pH of 7.31,  PCO2 43, PO2 of 119 and a bicarb of 22.  INR is 0.9.  Sodium 139, potassium  4.9, glucose 368, BUN 18, creatinine 1.5, alkaline phosphatase 129, other  LFTs normal.  CK 288, CK-MB 10.8, troponin I of 0.0.  Urinalysis showed 3+  protein with 1+ occult blood.  WBC was 19.5, hemoglobin 14.6, platelets  364,000.  BNP 981.   Chest x-ray report not available but verbal description was of pulmonary  edema.   IMPRESSION:  1. Probable unstable angina with associated congestive heart failure     symptoms in a 65 year old male with known coronary disease as outlined     above.  2. Hypertension.  3. Type 2 diabetes mellitus.  4. Dyslipidemia.  5. History of left carotid endarterectomy.   PLAN:  1. We will admit the patient to the coronary care unit and continue his home     medications with the addition of intravenous Lasix and Lovenox.  2. Cycle cardiac markers to assess trend and repeat chest x-ray.  3. I suspect that he will need repeat coronary angiography to evaluate     bypass graft anatomy for significant progression, particularly if his     cardiac markers continue to trend abnormal.                                                Jonelle Sidle, M.D. [facility]   SGM/MEDQ  D:  07/10/2003  T:  07/10/2003  Job:  [id number]

## 2010-11-06 NOTE — Discharge Summary (Signed)
. [facility]  Patient:    [name], [name]                      MRN: [id number] Proc. Date: 07/26/00 Adm. Date:  [id number] Disc. Date: [id number] Attending:  Melvenia Needles Dictator:   Lissa Hoard, P.A. CC:         Cecil Cranker, M.D. [facility]   Discharge Summary  DATE OF BIRTH:  1946/03/28  ADMISSION DIAGNOSIS:  Extracranial cerebrovascular occlusive disease.  SECONDARY DIAGNOSES:  1. Insulin-dependent diabetes mellitus.  2. Coronary artery disease.  3. Hypertension.  4. Hypercholesterolemia.  DISCHARGE DIAGNOSIS:  Status post left carotid endarterectomy.  PROCEDURES:  Left carotid endarterectomy with Dacron patch angioplasty.  HOSPITAL COURSE:  Mr. Amendola was admitted to [facility] on July 26, 2000, at which time he underwent elective left carotid endarterectomy with Dacron patch angioplasty.  This procedure was done by Dr. Madilyn Fireman under general endotracheal anesthesia.  No complications were noted during the procedure.  Postoperatively, the patient had an uneventful hospital course and remained completely neurologically intact.  He was discharged home on postop day #1, July 27, 2000.  MEDICATIONS AT TIME OF DISCHARGE:  1. Tylox one to two tablets q.4-6h. p.r.n. pain.  2. Isordil 20 mg p.o. q.d.  3. Spironolactone 25 mg p.o. q.d.  4. Lipitor 20 mg p.o. q.d.  5. Toprol XL 200 mg p.o. q.d.  6. Lasix 40 mg p.o. q.d.  7. Accupril 30 mg q.d.  8. Aspirin 325 mg one tablet q.d.  9. Glucophage 1 gram in the a.m. and 1 gram in the p.m. 10. Humulin insulin 70/30 75 units in a.m. and 65 units in p.m.  DISCHARGE INSTRUCTIONS:  Activity:  The patient was told to avoid driving and strenuous activity.  Diet:  An 1800-calorie ADA diet.  Wound care:  The patient was told to keep his incision clean for another 24 hours, then he could clean it with soap and water and shower.  DISPOSITION:  To home.  DISCHARGE FOLLOWUP:  The  patient was told to follow up with Dr. Madilyn Fireman at the CVTS office on Monday, February 18 at 8:50 a.m. DD:  08/29/00 TD:  08/30/00 Job: [id number]

## 2010-11-06 NOTE — Op Note (Signed)
[place]  Patient:    [name], [name]                      MRN: [id number] Proc. Date: 07/26/00 Adm. Date:  [id number] Attending:  Melvenia Needles CC:         Cecil Cranker, M.D. [facility]   Operative Report  PREOPERATIVE DIAGNOSIS:  Severe left internal carotid artery stenosis.  POSTOPERATIVE DIAGNOSIS:  Severe left internal carotid artery  stenosis.  PROCEDURE:  Left carotid endarterectomy, Dacron patch angioplasty.  SURGEON:  Denman George, M.D.  ASSISTANTS:  Di Kindle. Edilia Bo, M.D., and [place], P.A.  ANESTHESIA:  General endotracheal.  ANESTHESIOLOGIST:  Edwin Cap. Zoila Shutter, M.D.  CLINICAL NOTE:  This is a 65 year old male with a history of coronary artery disease, status post coronary artery bypass graft.  He was referred for evaluation of a left carotid bruit.  Doppler evaluation revealed a severe left internal carotid artery stenosis.  Arteriography verified these findings with a tight stenosis at the origin of the left internal carotid artery.  Risks and benefits of left carotid endarterectomy were explained to the patient, and he consented for surgery.  Operative risks, approximately 2%, to include but not limited to MI, CVA, and death.  DESCRIPTION OF PROCEDURE:  The patient was brought to the operating room in stable condition.  Placed in the supine position.  General endotracheal anesthesia induced.  Foley catheter and arterial line inserted.  Left neck prepped and draped in a sterile fashion.  Curvilinear skin incision made along the anterior border of the sternomastoid muscle.  Incision extended deeply through the subcutaneous tissue.  Platysma divided with electrocautery.  Dissection carried along the anterior border of the sternomastoid to the carotid sheath.  The common carotid artery mobilized to the level of the omohyoid muscle and encircled with a vessel loop.  The common carotid artery was  localized up to the bifurcation, the vagus nerve reflected posteriorly and preserved.  The origin of the superior thyroid and external carotid were encircled with fine vessel loops.  The internal carotid artery followed distally up the posterior belly of the digastric muscle.  The distal internal carotid artery encircled with a vessel loop.  There was a focal calcific plaque at the origin of the left internal carotid artery. Patient administered 7000 units of heparin intravenously.  Adequate circulation time permitted.  Carotid vessels controlled with clamps.  A longitudinal arteriotomy made across the carotid bulb and up into the internal carotid artery.  A high-grade left internal carotid artery stenosis evident, greater than 80%.  A shunt was inserted.  A focal endarterectomy was carried out of the origin of the left internal carotid artery.  Distally the plaque was feathered out.  There was a tear in the distal internal carotid artery, and this was repaired with interrupted 7-0 Prolene suture.  There was a ridge at the site of the distal margin of the endarterectomy due to thickened intima, and this was tacked down with running 7-0 Prolene suture.  A preclotted knitted Dacron patch was placed over the endarterectomy site using running 6-0 Prolene suture.  At completion of this, the shunt was removed.  All vessels were flushed, and clamps were removed directly, initially _____ and then up the external carotid artery.  The internal carotid artery then released.  There was an excellent pulse and Doppler signal in the distal internal carotid artery.  Patient administered 30 mg protamine intravenously and adequate  hemostasis obtained.  The sponge and instrument counts were correct.  The sternomastoid fascia then closed with running 2-0 Vicryl suture.  Platysma closed with running 3-0 Vicryl suture.  Skin closed with 4-0 Monocryl. Steri-Strips applied.  The patient tolerated the  procedure well.  Was transferred to the recovery room in stable condition.  Neurologically intact. DD:  07/26/00 TD:  07/27/00 Job: [id number]

## 2010-11-06 NOTE — Discharge Summary (Signed)
NAMENORRIN, SHREFFLER NO.:  [id number]   MEDICAL RECORD NO.:  [id number]          PATIENT TYPE:  INP   LOCATION:  3739                         FACILITY:  [facility]   PHYSICIAN:  Veverly Fells. Excell Seltzer, MD  DATE OF BIRTH:  1945/12/30   DATE OF ADMISSION:  DATE OF DISCHARGE:  05/26/2006                               DISCHARGE SUMMARY   PRIMARY CARDIOLOGIST:  Cecil Cranker, MD, [facility]   PRIMARY CARE PHYSICIAN:  Dr. Dina Rich   PRIMARY DIAGNOSES:  1. Severe native three vessel disease.      a.     Status post four-vessel coronary artery bypass grafting 1993       with redo in 1999.   SECONDARY DIAGNOSIS:  1. Status post stent placement to high grade lesion in the saphenous      vein graft to the circumflex in November 2007, per Dr. Tonny Bollman.  2. Hypertension.  3. Hyperlipidemia.  4. History of cerebrovascular accident with left carotid      endarterectomy.  5. Recent congestive heart failure.  6. Diabetes.   PROCEDURES PERFORMED DURING THIS HOSPITALIZATION:  Cardiac  catheterization performed by Dr. Tonny Bollman on May 25, 2006,  revealing severe three-vessel native vessel disease, status post CABG  with four patent grafts, improved LV EDP.  Continue medical treatment.   HISTORY OF PRESENT ILLNESS:  This is a 65 year old male patient of Dr.  Glennon Hamilton who has had a recent bare metal stent placed to a saphenous  vein graft in November 2007.  The patient had episode of severe  prolonged chest pain approximately 9 days post procedure with  intermittent chest pain the days following.  The patient did have an  episode of chest pain on day of admission and was seen by Dr. Corinda Gubler  and admitted for further evaluation and treatment of possible end stent  re-stenosis.   HOSPITAL COURSE:  The patient was seen and examined by Dr. Tonny Bollman on May 25, 2006 and cardiac catheterization was planned for  the following day.  The patient did not  have any acute ST T wave changes  indicative of new ischemia.  The patient has known moderate disease in  his native right coronary artery that supplies the large RPL branch and  consideration for intervention of that vessel if SVG stent was patent  was discussed by Dr. Excell Seltzer with the patient.   The patient did have a cardiac catheterization by Dr. Excell Seltzer with  results as described above.  The patient's cardiac catheterization films  were reviewed with prior cardiac catheterization completed on May 10, 2006.  Dr. Excell Seltzer evaluated the saphenous vein graft to the PDA.  Fills the territory well and felt that Mr. Banka angina was  secondary to small vessel disease which he clearly exhibited.  The  patient had significant stenosis in the distal PDA as well as distal  obtuse marginal branch.  Neither of those areas were approachable via  percutaneous technique as they were small vessels.   DISCHARGE LABS:  Sodium 136, potassium 4.1, chloride  105, CO2 27,  glucose 208, BUN 23, creatinine 1.0. Hemoglobin 9.8, hematocrit 29.5,  white blood cells 8.4, platelets 216, PT 13.4, INR 1.0, PTT 32, Calcium  8.7.  Vital signs on discharge revealed blood pressure 124/62, heart  rate 71, respirations 20, temperature 97.9, 02 sat on room air was 93%.  The patient's weight was 243.5 pounds.  EKG revealed normal sinus rhythm  with first degree AV block with history of anterior infarct seen.   FOLLOWUP APPOINTMENTS AND INSTRUCTIONS:  1. The patient is scheduled to see Dr. Glennon Hamilton on December 18 at      11:00 a.m.  2. The patient is to have an H&H completed during that above      appointment secondary to mildly low hemoglobin and hematocrit as      described above.  3. The patient was given post cardiac catheterization instructions      with note to call office for any problems with the site to include      bleeding, swelling or signs of infection.  The patient is also      instructed to call  the office if he has recurrence of chest      discomfort.   DISCHARGE MEDICATIONS:  1. Lipitor 80 mg at bedtime.  2. Zetia 10 mg once a day.  3. Isosorbide mono 60 mg once a day.  4. Diovan 160 mg once a day.  5. Zoloft 100 mg once a day.  6. Plavix 75 mg daily.  7. Lasix 80 mg daily.  8. Aspirin 325 mg daily.  9. Coreg 25 mg b.i.d.  10.Lantus insulin as directed.  11.Potassium 20 mEq daily.  12.Nitroglycerin 0.4 mg p.r.n. chest pain.   Time spend during discharge to include physician time greater than 30  minutes.      Bettey Mare. Lyman Bishop, NP      Veverly Fells. Excell Seltzer, MD  Electronically Signed    KML/MEDQ  D:  05/26/2006  T:  05/27/2006  Job:  [id number]   cc:   Dina Rich

## 2010-11-06 NOTE — H&P (Signed)
[place]  Patient:    [name], [name]                        MRN: [id number] Adm. Date:  07/26/00 Attending:  Denman George, M.D. Dictator:   Marlowe Kays, P.A. CC:         Tawni Millers, M.D.  Cecil Cranker, M.D. [facility]   History and Physical  DATE OF BIRTH:  [birth year]  CHIEF COMPLAINT:  Carotid artery disease.  HISTORY OF PRESENT ILLNESS:  A 65 year old white male referred by Dr. Glennon Hamilton for evaluation of coronary artery disease.  On July 04, 2000, a left carotid bruit was heard at the cardiac evaluation.  Dopplers revealed severe left ICA stenosis.  An arteriogram on July 19, 2000, per patient report (no records), confirmed the diagnoses.  Dr. Madilyn Fireman recommended left CEA, to prevent the risk of stroke, which is scheduled for July 26, 2000.  No headache, nausea or vomiting, or vertigo.  No dizziness, falls, seizures, numbness or tingling.  No muscle weakness, speech impairment, dysphagia, vision changes, syncope, presyncope.  No memory loss or confusion.  PAST MEDICAL HISTORY: 1. Carotid artery disease. 2. IDDM. 3. CAD status post CABG. 4. Hypertension. 5. Hypercholesterolemia. 6. Moderate obesity. 7. Peripheral neuropathy, mild.  SURGERIES: 1. Status post arteriogram July 19, 2000,. 2. Status post CABG 1993 with re-do in 1999, Dr. Tyrone Sage. 3. Status post PTCA and stenting June 2001, Dr. Glennon Hamilton. 4. Status post PTCA and stent x 3, July 2001. 5. Status post laser surgery 2000.  MEDICATIONS: 1. Humulin insulin 70/30, 75 units in the morning, 65 units in the evening. 2. Isordil 20 mg p.o. q.d. 3. Glucophage 1 gram a.m. and 1 gram p.m. 4. Spironolactone 25 mg p.o. q.d. 5. Lipitor 20 mg p.o. q.d. 6. Deprol XL 200 mg p.o. q.a.m. 7. Lasix 40 mg p.o. q.a.m. 8. Accupril 30 mg 1 p.o. q.d. 9. Nitrostat p.r.n. and aspirin 325 mg p.o. q.d.  ALLERGIES:  No known drug allergies.  REVIEW OF SYSTEMS:  See HPI  and past medical history for significant positives.  FAMILY HISTORY: Mother alive, 29.  Father died of heart disease 75.  Brother alive with a history of heart disease and diabetes and another brother alive with a history of hypercholesterolemia.  SOCIAL HISTORY:  The patient is married.  He has 3 children.  He is on disability.  He never smoked.  He denies any alcohol intake.  PHYSICAL EXAMINATION:  GENERAL:  This is a 65 year old obese white male in no acute distress. Alert and oriented x 3.  VITAL SIGNS:  Blood pressure 150/80, pulse 68, respirations 16.  HEENT:  Normocephalic and atraumatic.  PERRLA, EOMI.  Mild cataract formation on the right.  No glaucoma or macular degeneration.  NECK:  Supple.  No JVD.  Left bruits.  No lymphadenopathy.  CHEST:  Symmetrical on inspirations.  LUNGS:  Showed no wheezes or rhonchi.  There is a trace of dry rales.  No lymphadenopathy.  CARDIOVASCULAR:  Regular rate and rhythm.  1/6 systolic murmur.  No rubs or gallops.  ABDOMEN:  Obese, nontender.  Bowel sounds x 4.  No masses or bruits.  GU/RECTAL:  Deferred.  EXTREMITIES:  No clubbing cyanosis.  1+ edema bilaterally.  No ulcerations.  TEMPERATURE:  Warm.  PERIPHERAL PULSES:  Carotids 2+ bilaterally.  Femoral, popliteal, dorsalis pedis and posterior tibialis 2+ bilaterally.  NEUROLOGIC:  Nonfocal.  Gait steady.  DTRs  1+ bilaterally.  Muscle strength 5/5.  ASSESSMENT AND PLAN:  Carotid artery disease for left CEA at [facility] on July 26, 2000, Dr. Madilyn Fireman.  Dr. Madilyn Fireman has seen and evaluated this patient prior to admission and has explained the risks and benefits involved in the procedure and the patient has agreed to continue. DD:  07/25/00 TD:  07/25/00 Job: [id number]

## 2010-11-06 NOTE — Cardiovascular Report (Signed)
NAMESWAIN, [name]                         ACCOUNT NO.:  [id number]   MEDICAL RECORD NO.:  [id number]                   PATIENT TYPE:  OUT   LOCATION:  EKG                                  FACILITY:   PHYSICIAN:  Arturo Morton. Riley Kill, M.D.             DATE OF BIRTH:  10/25/1945   DATE OF PROCEDURE:  01/08/2003  DATE OF DISCHARGE:                              CARDIAC CATHETERIZATION   INDICATIONS:  [name] is well known to [place].  He is a 65 year old  gentleman who has had prior bypass surgery x2.  The current study was done  to assess recurrent ischemic chest pain.   PROCEDURES:  1. Left heart catheterization.  2. Selective coronary arteriography.  3. Selective left ventriculography.  4. Saphenous vein graft angiography x2.  5. Selective left internal mammary angiography.   DESCRIPTION OF PROCEDURE:  The patient was performed from the right femoral  artery using 6 French catheter.  He tolerated the procedure without  complication.  He was taken to the holding area in satisfactory clinical  condition.  His heparin was discontinued.   HEMODYNAMIC DATA:  1. Central aorta:  163/89 with a mean of 121.  2. Left ventricle:  164/33.  3. No gradient on pullback across the aortic valve.   ANGIOGRAPHIC DATA:  1. Ventriculography in the RAO projection revealed hypokinesis of the     anterolateral and inferior segment.  The overall systolic function     appeared to be reasonably well preserved.  2. The left main coronary artery was free of critical disease.  3. The LAD has a 90% stenosis and then there is competitive filling in the     mid vessel.  4. The circumflex has 90% stenosis and is totally occluded.  5. The right coronary artery has multiple lesions with a 50 and then a 70     and then a 50-70% lesion just prior to the crux.  There is competitive     filling of the PDA.  This fills a large posterior lateral system.  6. The internal mammary to the LAD is widely patent.   There is an area of     irregularity noted in the proximal to mid graft of some nodular     irregularity, but no focal stenosis.  7. The free RIMA that supplies the diagonal appears to be intact.  8. The vein graft to the OM has widely patent stent and a 30-40% narrowing     distally.  9. The vein graft to the PDA is intact.  The PDA has a distal stenosis.     There is retrograde filling of this into the distal right circulation.   IMPRESSION:  1. Preserved overall left ventricular function.  2. Continued patency of the internal mammary to left anterior descending.  3. Continued patency of the right internal mammary artery to the diagonal .  4. Continued patency of the vein  graft to the obtuse marginal.  5.     Continued patency to the posterior descending artery with a distal posterior     descending artery stenosis and retrograde filling of the posterior     lateral system.   DISPOSITION:  At the present time, we will likely recommend continued  medical therapy.                                                  Arturo Morton. Riley Kill, M.D.    TDS/MEDQ  D:  01/08/2003  T:  01/08/2003  Job:  [id number]

## 2010-11-06 NOTE — H&P (Signed)
[place]. [facility]  Patient:    [name], [name]                      MRN: [id number] Adm. Date:  [id number] Attending:  Devoria Albe Dictator:   Abelino Derrick, P.A.C. [facility]                         History and Physical  CHIEF COMPLAINT/HISTORY OF PRESENT ILLNESS: The patient is a 65 year old male, followed by Dr. Graceann Congress and [facility], having a history of coronary disease and had bypass surgery in 1993.  He was admitted in June 2001 with chest pain and underwent catheterization and percutaneous transluminal coronary angioplasty and stenting to the saphenous vein graft of the circumflex.  At that time the left internal mammary artery to the left anterior descending was patent, the free right internal mammary artery grafts to the intermediate was patent, and the saphenous vein graft to the posterior descending and posterolateral was patent, with a total distal limb.  Ejection fraction was 50%.  The patient was readmitted in July 2001 with recurrent chest pain and underwent another catheterization which revealed a patent stent site at the saphenous vein graft of the obtuse marginal.  The native right coronary artery was dilated by Dr. Juanda Chance and stented.  Since then he had done fairly well until the last couple of weeks.  He saw Dian Queen, P.A.C. recently in the office with recurrent chest pain.  Bill had set him up for a stress Cardiolite study and then to see Dr. Graceann Congress back, but he presents to the emergency room now at the urging of his wife.  Apparently he has been having chest pressure over the last two weeks.  Friday prior to this admission he woke up with substernal chest pain and had some nausea and mild diaphoresis.  He took nitroglycerin with some relief but the symptoms recurred.  The weather was bad and he did not want to come to the emergency room.  He has had more pain today.  He is admitted now for further  evaluation.  PAST MEDICAL HISTORY:  1. Insulin-dependent diabetes.  2. Hyperlipidemia.  3. Hypertension.  ALLERGIES: No known drug allergies.  CURRENT MEDICATIONS:  1. Toprol 150 mg b.i.d.  2. Glucophage 1 g b.i.d.  3. Accupril 30 mg q.d.  4. Lipitor 20 mg q.d.  5. Enteric-coated aspirin q.d.  6. Nitroglycerin sublingual p.r.n.  SOCIAL HISTORY: He is married.  He is a nonsmoker and never smoked.  FAMILY HISTORY: Positive for coronary disease.  His father had a bypass in his 55s.  One brother has a history of heart failure.  REVIEW OF SYSTEMS: Essentially unremarkable except as noted above.  There is no history of peptic ulcer disease or GI bleeding.  He does have peripheral neuropathy.  PHYSICAL EXAMINATION:  GENERAL: He is a well-developed, well-nourished male in no acute distress.  VITAL SIGNS: Blood pressure 126/76, pulse 86, respirations 18.  NECK: Bilateral bruits.  No JVD.  CHEST: Clear to auscultation and percussion.  CARDIAC: Regular rate and rhythm without murmurs, rubs, or gallops.  Normal S1 and S2.  ABDOMEN: Nontender.  No hepatosplenomegaly.  EXTREMITIES: Without bruits.  He has trace edema.  Distal pulses are intact.  NEUROLOGIC: Grossly intact examination.  He is awake, alert, oriented, and cooperative.  LABORATORY DATA: EKG shows sinus rhythm, poor anterior R wave progression; no acute changes.  IMPRESSION:  1. Chest pain consistent with unstable angina.  2. Known coronary disease, status post coronary artery bypass grafting in     1993 with stenting to the saphenous vein graft to the obtuse marginal in     June 2001 and stenting to the native right coronary artery in July 2001.  3. Insulin-dependent diabetes, with peripheral neuropathy.  4. Treated hypertension.  5. Treated hyperlipidemia.  PLAN: The patient will be admitted and started on IV heparin, nitrates, and set up for re-look catheterization. DD:  06/27/00 TD:  06/27/00 Job:  [id number]

## 2010-11-06 NOTE — Cardiovascular Report (Signed)
[place]. [facility]  Patient:    [name], [name]                      MRN: [id number] Proc. Date: 12/21/99 Adm. Date:  [id number] Attending:  Alric Quan CC:         Dr. Verdis Frederickson, [facility]             E. Graceann Congress, M.D. [facility]             Bruce R. Juanda Chance, M.D. [facility]             Cardiopulmonary Laboratory                        Cardiac Catheterization  CLINICAL HISTORY:  Ms. Murray Hodgkins is 65 years old and had a re-do bypass surgery in 1999 and was admitted about two weeks ago and had a percutaneous coronary intervention of the vein graft to the marginal branch of the circumflex artery using the X-Sizer thrombectomy catheter and a stent.  He had residual disease in the distal right coronary artery which was not supplied by the occluded limb of a saphenous vein graft to the posterior descending and posterolateral branch of the right coronary artery.  We elected to treat this medically. However, he returned with recurrent chest pain and was admitted with a diagnosis of unstable angina.  DESCRIPTION OF PROCEDURE:  The procedure was performed via the right femoral artery using an arterial sheath and 6 French preformed coronary catheters.  A front wall arterial puncture was performed and Omnipaque contrast was used. At the completion of the diagnostic study, we made a decision to proceed with intervention on the native right coronary artery.  The patient was given weight-adjusted heparin to prolong the ACT greater than 200 seconds and was given double bolus Integrilin and infusion.  We used a JR4 guiding catheter, 7 [place].  We used a BMW short wire.  There were three tandem lesions, one in the distal right coronary artery, one in the distal right coronary artery after the posterior descending branch, and one in the posterolateral branch just before it bifurcated into two subbranches.  We first predilated with a 3.25 x 20 mm CrossSail dilating  the first and the second then the third lesions with inflations of 8, 7, 7, and 7 atmospheres for approximately 40 seconds each.  The dilatation in the most distal lesion resulted in a small split and we elected to stent this where previously we had thought we might just balloon it alone.  We stented this with a 3.0 x 15 mm AVE stent and crossed the sub-branch.  We deployed this with one inflation of 12 atmospheres for 47 seconds.  We then deployed a 3.0 x 8 mm Tetra in the AV portion of the right coronary artery just distal to where the posterior descending branch took off.  There was some competing flow through the vein graft and the posterior descending branch into the AV branch.  We deployed this with two inflations of 12 and 15 atmospheres for 30 and 33 seconds.  We then deployed a 3.5 x 18 mm Tetra in the distal right coronary artery before the posterior descending branch and deployed this stent with one inflation of 15 atmospheres for 54 seconds.  We then elected to use this same balloon to further dilate the proximal portion of the 3.0 x 8 mm Tetra stent and performed two  inflations at 12 and 14 atmospheres for 39 and 37 seconds.  Repeat diagnostic studies were then performed through the guiding catheter.  The patient tolerated the procedure well and left the laboratory in satisfactory condition.  RESULTS:  The left main coronary artery:  The left main coronary artery was free of significant disease.  Left anterior descending:  The left anterior descending artery is completely occluded proximally.  Circumflex artery:  The circumflex artery was completely occluded proximally after an intermediate branch which had an 80% proximal stenosis.  Right coronary artery:  The right coronary was a moderately large vessel that gave rise to a right ventricular branch, a posterior descending branch and two posterolateral branches.  There was 80% narrowing before the posterior descending  branch.  There was 80% narrowing in the AV branches after the posterior descending branch with some competing flow from the posterior descending branch to the vein graft.  There was 70% stenosis in the distal right coronary artery just before the bifurcation of the two posterolateral branches.  The saphenous vein graft to the posterior descending branch of the right coronary artery was patent in its proximal limb but occluded in its distal limb to the posterolateral branch.  There was 70% stenosis in the proximal portion of the posterior descending branch which partially impeded flow to the AV right coronary artery.  The saphenous vein graft to the marginal branch of the circumflex artery was patent.  The stent located in the mid vessel was patent with no significant re-stenosis.  There was a valve in the proximal portion of the graft which was visible.  The free RIMA graft which arose from the saphenous vein graft to the circumflex system was patent and supplied an intermediate branch.  The LIMA graft to the LAD was patent on prior study and was not reinjected.  LEFT VENTRICULOGRAPHY:  The left ventriculogram performed in the RAO projection showed hypokinesis of the anterolateral wall.  The overall wall motion was good with an estimated ejection fraction of 50%.  Following stenting of the distal lesion in the posterolateral branch the stenosis improved from 70% to 0%.  There do not appear to be any significant compromise to the second posterolateral branch.  Following stenting of the AV right coronary artery distal to the posterior descending branch the stenosis improved from 80% to less than 10%.  Following stenting of the distal right coronary artery the stenosis improved from 80% to less than 10%.  The aortic pressure was 146/86 with a mean of 111.  Left ventricular pressure was 146/33.  CONCLUSIONS: 1. Coronary artery disease, status post re-do coronary artery bypass  graft     surgery in 1999 and status post extraction atherectomy and stenting of the    midportion of the vein graft to the marginal vessel two weeks ago.  The    native circulation shows total occlusion of the left anterior descending    artery and circumflex artery and 80% stenosis in the distal right coronary    artery with 80% stenosis distal to the posterior descending branch and 70%    stenosis in the posterolateral branch of the right coronary artery.  The]    grafts show a patent vein graft to the posterior descending branch in its    proximal portion but occlusion of the distal limb to the posterolateral    branch, a patent vein graft to the marginal branch of the circumflex artery    with 0% stenosis at the stent site,  a patent RIMA graft to the intermediate    branch of the circumflex artery and a patent LIMA graft to the left    anterior descending artery (by prior study).  Left ventricular function    shows anterolateral wall hypokinesis. 2. Successful stenting of tandem lesions in the distal right coronary artery    with improvement in percent diameter narrowing from 70% to 0% in the    posterolateral branch, improvement from 80% to less than 0% in the    AV right coronary artery distal to the posterior descending branch, and    improvement in the stenosis from 80% to less than 10% in the distal    right coronary artery.  DISPOSITION:  The patient was returned to the postangioplasty unit for further observation. DD:  12/21/99 TD:  12/22/99 Job: [id number]

## 2010-11-06 NOTE — Cardiovascular Report (Signed)
[name], CEFALU NO.:  [id number]   MEDICAL RECORD NO.:  [id number]          PATIENT TYPE:  INP   LOCATION:  6523                         FACILITY:  [facility]   PHYSICIAN:  Veverly Fells. Excell Seltzer, MD  DATE OF BIRTH:  11-27-1945   DATE OF PROCEDURE:  05/11/2006  DATE OF DISCHARGE:                              CARDIAC CATHETERIZATION   ADDENDUM:   INDICATIONS:  [name] is a very nice 65 year old gentleman who presented  with crescendo angina.  He has known coronary artery disease and is status  post prior bypass surgery.  He has had multiple percutaneous interventions  in the setting of his typical angina.  He was referred for cardiac  catheterization which was performed by Dr. Bonnee Quin yesterday.  His  catheterization demonstrated high-grade disease in the distal body of the  saphenous vein graft to the obtuse marginal branch of the left circumflex.  He had elevated left ventricular filling pressures and therefore underwent a  staged procedure.   PROCEDURAL DETAILS:  Risks and indications for the procedure were explained  in detail to the patient.  Informed consent was obtained.  The right groin  was prepped, draped and anesthetized with 1% lidocaine.  Using the modified  Seldinger technique, a 7-French arterial sheath was placed in the right  femoral artery.  A 7-French LCB coronary guiding catheter was inserted with  a Proxis protection device.  Angiomax was used for anticoagulation.  Once a  therapeutic ACT was achieved, a BMW coronary guidewire was inserted into the  vein graft and the Proxis device was deployed.  A small amount of contrast  was given using normal technique and I was unable to wire the lesion with  the BMW guide wire.  I therefore deflated the Proxis balloon and made  another attempt with a whisper guidewire.  Upon deployment of the Proxis  balloon, the lesion wired easily with the whisper wire.  A 3.5 x 24 mm  Liberte stent was deployed  at 14 atmospheres.  Following stent deployment,  the protection device yielded a moderate amount of debris.  There was  excellent flow throughout the vessel with TIMI III flow throughout.  The  patient had no chest pain.  At that point, I elected to postdilate the stent  as the saphenous vein graft appeared to be a 4.0 vessel.  I postdilated with  a 4.0 x 20 mm Quantum Maverick balloon.  I had difficulty passing the  balloon beyond the stent with a whisper wire.  I elected to buddy wire the  vein graft with the BMW wire and the balloon easily crossed the stent over  the BMW guidewire.  The Proxis device was again inflated and the vessel was  postdilated up to 16 atmospheres.  Following postdilatation, there was  excellent stent expansion and TIMI III flow throughout.  Multiple angiograms  were taken that demonstrated an excellent flow with no residual stenosis.  The patient tolerated the procedure well and had no chest discomfort.   CONCLUSION:  Successful stenting of the distal body of the saphenous vein  graft to  the obtuse marginal branch of the left circumflex with a bare-metal  stent.  The patient should continue on dual antiplatelet therapy with  aspirin and clopidogrel.  We will plan on discharge tomorrow if he has an  uncomplicated post PCI course.     Veverly Fells. Excell Seltzer, MD  Electronically Signed    MDC/MEDQ  D:  05/11/2006  T:  05/11/2006  Job:  [id number]   cc:   Raymond Morton. Riley Kill, MD, [facility]

## 2010-11-06 NOTE — Cardiovascular Report (Signed)
[place]. [facility]  Patient:    [name], [name] Visit Number: [id number] MRN: [id number]          Service Type: MED Location: [phone] Attending Physician:  Edwyna Perfect Dictated by:   Everardo Beals Juanda Chance, M.D. [facility] Proc. Date: 01/10/02 Admit Date:  01/08/2002 Discharge Date: 01/11/2002   CC:         Cardiopulmonary Lab  C. Ulyess Mort, M.D.  Cecil Cranker, M.D. [facility]   Cardiac Catheterization  CLINICAL HISTORY:  [name] is 65 years old and has had a redo bypass surgery in 1999 and since that time has had a stent placed to the saphenous vein graft to the marginal branch of the circumflex artery and three stents placed in the distal native right coronary artery in 2001.  The ejection fraction at that time was 50%.  He is admitted now with a two week history of chest discomfort suggestive of unstable angina.  His CK-MB was slightly elevated, but troponin was negative.  PROCEDURE: 1. Right and left heart catheterization. 2. Coronary angiography.  CARDIOLOGISTEverardo Beals Juanda Chance, M.D. [facility]  PROCEDURE IN DETAIL:  The procedure was performed via the right femoral artery using a arterial sheath and 6-French preformed coronary catheters.  A frontal arterial punch was performed, and Omnipaque contrast was used.  We used a left bypass graft catheter for injection of the vein graft to the circumflex artery.  We used a right bypass graft catheter for injection in the vein graft to the right coronary artery.  We used a LIMA catheter for injection of the LIMA graft.  Right heart catheterization was performed at the end of the procedure because of elevated LVEDP and no obvious source of ischemia.  The patient tolerated the procedure well and left the laboratory in satisfactory condition.  RESULTS:  Left main coronary artery was free of significant disease.  Left anterior descending artery was completely occluded near its origin.  The left  circumflex artery was completely occluded near its origin.  The right coronary artery was a moderate size vessel that gave rise to a right ventricular branch, a posterior descending branch, and a posterolateral branch.  There was 50% proximal and 50% mid stenosis of the right coronary artery.  There was less than 20% stenosis at the stent site in the distal right coronary artery before the posterior descending branch and in the A-V branch after the posterior descending branch, and in the proximal portion of the posterolateral branch.  The posterior descending branch had competing flow distally from the vein graft.  The saphenous vein graft to the posterior descending branch of the right coronary artery was patent and functioned well.  There was 90% stenosis in the distal portion of the posterior descending branch which was present on the previous study.  The saphenous vein graft to the distal circumflex artery was patent, and there was 30% narrowing in its distal portion.  The free RIMA graft to the intermediate branch of the circumflex artery was attached to the vein graft to the circumflex artery and was patent and free of significant disease.  The LIMA graft to the LAD was patent and functioned normally.  There was no significant disease in the distal LAD.  The left ventriculogram performed in the RAO projection showed global hypokinesis.  The estimated ejection fraction was 35 to 40% which was reduced from the previous study.  HEMODYNAMIC DATA:  The right atrial pressure was 20 mean.  The right ventricular pressure was 61/20.  Pulmonary artery pressure was 61/35 with a mean of 46.  Pulmonary wedge pressure was 26 mean.  Left ventricular pressure was 191/42, and the aortic pressure was 191/103 with a mean of 136.  Cardiac output/cardiac index by Fick was 4.7/1.9 liters/minutes/meters squared.  CONCLUSION: 1. Status post redo coronary artery bypass graft surgery in  1999. 2. Severe native vessel disease with total occlusion of the left anterior    descending artery and circumflex arteries and 50% narrowing in the proximal    and mid portion of the right coronary artery with less than 20% narrowing    at the stent sites in the distal right coronary artery and the    posterolateral branch of the right coronary artery. 3. Patent vein graft to the posterior descending branch of the right coronary    artery with 90% stenosis in the distal portion of the posterior descending    branch (old), patent vein graft to the distal circumflex artery, patent    free right internal mammary artery graft to the intermediate branch of    the circumflex artery, and patent left internal mammary artery graft to    the left anterior descending artery. 4. Global hypokinesis with estimated ejection fraction of 35 to 40% which is    decreased from previous studies. 5. Elevated left ventricular filling pressures with a pulmonary wedge    pressure of 26.  RECOMMENDATIONS:  There is no clear source of ischemia to explain the patients admission symptoms.  His left ventricular function is worse, and his filling pressures are elevated, and this may be responsible for his symptoms. The reason for his worsening LV function is not clear.  I would recommend diuresis and further optimization of treatment of his heart failure with increasing doses of beta blockers and ACE inhibitors as tolerated. Dictated by:   Everardo Beals Juanda Chance, M.D. [facility] Attending Physician:  Edwyna Perfect DD:  01/10/02 TD:  01/14/02 Job: [id number]

## 2010-11-06 NOTE — Discharge Summary (Signed)
[place]. [facility]  Patient:    [name], [name] Visit Number: [id number] MRN: [id number]          Service Type: Attending:  Alvester Morin, M.D. Dictated by:   Ladell Pier, M.D. Adm. Date:  06/18/01 Disc. Date: 06/20/01   CC:         Tawni Millers, M.D.   Discharge Summary  DISCHARGE PHYSICIAN:  Alvester Morin, M.D.  DISCHARGE DIAGNOSES:  1. Fecal impaction secondary to opioid use for chronic back pain.  2. Coronary artery disease status post stents in 2001, status post coronary     artery bypass grafting times two in 1993 and 1999, last catheterization     in January 2002 showed ejection fraction of 50% with 30% stenosis of     graft to left anterior descending, congestive heart failure, ejection     fraction 06/2000 showed 50%.  3. 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  4. Carotid endarterectomy 07/2000.  5. Obesity, status post gastric stapling, status post reversal.  6. Hyperlipidemia.  DISCHARGE MEDICATIONS:  1. Glucophage XR 2000 mg q.d.  2. Avandia 2.5 mg b.i.d.  3. Toprol XL 200 mg q.d.  4. Accupril 30 mg q.d.  5. Norvasc 5 mg q.d.  6. Lasix 80 mg b.i.d.  7. Motrin 600 mg q.6h p.r.n. pain  8. Imdur 60 mg q.d.  9. Robaxin 1000 mg take one tablet 4 times daily. 10. Darvocet-N 100 one q.4h p.r.n. pain.  FOLLOW UP APPOINTMENTS:  Patient was told to call and make an appointment to follow up with Dr. Verdis Frederickson in the outpatient clinic.  Patient has scheduled for magnetic resonance imaging in Radiology with conscious sedation January 13th at 9:00 a.m.  Patient given phone number to call if he needs to cancel, [phone].  CONSULTANTS:  None.  PROCEDURES:  None.  HISTORY OF PRESENT ILLNESS:  The patient is a 65 year old white male with history of coronary artery disease, diabetes mellitus that is insulin dependent, recently discharged from the hospital with back pain on 06/04/2001 with prescription for opioids  analgesic during hospitalization.  During the patients hospitalization he was treated with intravenous morphine and discharged on Percocet.  The patient presented to the emergency department with complaints of constipation times three days having tried Milk of Magnesia twice without success.  The patients wife gave him Fleet enemas this AM without relief.  The patient was given Fleet enemas plus manual disimpaction by the emergency department physician without relief.  The patient complained of diffuse abdominal pain with no radiation, no fever.  The patient also complained of chronic back pain that is worse when he is lying down, relieved by sitting up.  PAST MEDICAL HISTORY:  As per discharge diagnoses.  FAMILY HISTORY:  Father had coronary artery disease, his brother has coronary artery disease.  SOCIAL HISTORY:  Patient lives with his wife.  He does not smoke and does not drink alcohol.  He has no intravenous drug use.  MEDICATIONS ON ADMISSION:  1. Glucophage XR 2000 mg q.d.  2. Avandia 2.5 mg b.i.d.  3. Toprol XL 200 mg q.d.  4. Accupril 30 mg q.d.  5. Norvasc 5 mg b.i.d.  6. Lasix 80 mg b.i.d.  7. Motrin 600 mg q.6h p.r.n.  8. Insulin 70/30, 70 units in a.m., 65 units in p.m.  9. Flexeril 10 mg b.i.d. 10. Imdur 60 mg q.d. 11. Valium p.r.n.  ALLERGIES:  NO KNOWN DRUG ALLERGIES.  PHYSICAL EXAMINATION:  GENERAL:  Patient alert and oriented times three.  VITAL SIGNS:  Temperature 97.5. Respirations 20. Pulse 89, blood pressure 145/89, pulse oximetry 96% on room air.  HEENT:  Head normocephalic, atraumatic.  Pupils are equal, round and reactive to light.  NECK:  Supple.  No jugular venous distension.  LUNGS:  Clear to auscultation bilaterally.  HEART:  Regular rate and rhythm with no murmurs, rubs, or gallops.  ABDOMEN: Obese, right upper quadrant tenderness, minimal right lower quadrant tenderness, no rebound, no guarding, no bowel sounds. Old midline scar. Rectal  examination shows hard stool, was hard to disimpact because the stool was high up in rectum. Prostate tender.  Stool for Hemoccult negative.  HOSPITAL COURSE: #1 - CONSTIPATION:  The patients constipation was most likely due to the opioid use.  An abdominal series showed no evidence of obstruction.  The patient had abdominal pain with no relief of his constipation with Fleet enemas times three.  He was admitted to regular bed, kept NPO and given mineral oils with the intention to treat with GoLYTELY if patient did not have a bowel movement.  The patient started regular bowel movements with his hospital stay.  #2 - CORONARY ARTERY DISEASE:  Remained stable during the patients hospitalization.  #3 - DIABETES MELLITUS:  The patient had CBGs checked during his hospital stay and was continued on his insulin and remained stable during his hospitalization.  #4 - CONGESTIVE HEART FAILURE:  Stable during hospitalization as well.  He was continued on his home medications.  #5 - BACK PAIN: Most likely muscular in origin.  The patient was set up to get an magnetic resonance imaging done on an outpatient basis.  He has tried several times to get an magnetic resonance imaging of his back but because of his claustrophobia was unable to do so.  He was set up to get conscious sedation magnetic resonance imaging of the lumbosacral spine.  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  ACUTE ABDOMEN WITH CHEST:  Normal bowel gas pattern, gallstones, no active cardiopulmonary disease.  CBC:  White blood cell count 9.6, hemoglobin 11.4, hematocrit 33.2, MCV 89.3, platelet count 266K.  ESR:  25.  ELECTROLYTES:  Sodium 138, potassium 3.6, chloride 103, cO2 28, glucose 93, BUN 40, creatinine 1.1, calcium 8.6, AST 24, ALT 17, alkaline phosphatase 85,  total bilirubin 0.6, amylase 13, lipase  17.  HEMOGLOBIN A1C:  8.1.  URINE DRUG SCREEN:  Positive for benzos and opiates.  CARDIAC ENZYMES:  Negative.  CK 400, MB 8.1, relative index 2.0, troponin-I 0.01.  URINALYSIS:  Showed small hemoglobin with greater than 300 protein, epithelial cells few, white blood cells 3-6, red blood cells 7-10, bacteria few.  FOLLOW UP: Patient will follow up in outpatient clinic.  On follow up his anemia will be evaluated and his hematuria, proteinuria will also be evaluated. Dictated by:   Ladell Pier, M.D. Attending:  Alvester Morin, M.D. DD:  08/23/01 TD:  08/25/01 Job: [id number]

## 2010-11-06 NOTE — Discharge Summary (Signed)
[place]. [facility]  Patient:    [name], [name] Visit Number: [id number] MRN: [id number]          Service Type: MED Location: 3000 [id number] Attending Physician:  Madaline Guthrie Dictated by:   Sharren Bridge, MS4 Admit Date:  06/04/2001 Discharge Date: 06/06/2001                             Discharge Summary  DISCHARGE DIAGNOSES: 1. Back pain. 2. Hyperlipidemia. 3. Insulin-dependent diabetes mellitus. 4. Obesity. 5. Coronary artery disease with unstable angina. 6. Status post left carotid endarterectomy.  DISCHARGE MEDICATIONS:  1. Lasix 80 mg p.o. b.i.d.  2. Motrin 600 mg p.o. q.6h. p.r.n. pain.  3. Percocet one or two tabs p.o. q.4-6h. p.r.n. pain.  4. Flexeril 10 mg p.o. b.i.d. p.r.n. pain.  5. Norvasc 5 mg p.o. b.i.d.  6. Toprol XL 200 mg one tablet p.o. q.d.  7. Glucophage 1000 mg p.o. b.i.d.  8. Avandia 4 mg p.o. b.i.d.  9. Imdur 60 mg p.o. q.d. 10. Insulin 70/30 take 70 units q.a.m. and 65 units q.p.m.  HISTORY OF PRESENT ILLNESS:  [name] is a 65 year old white male who presents with new onset back pain x 4 days.  Pain is located in the center of his lower back.  Pain was sudden in onset.  The patient is not sure what he was doing when the pain began.  There were no identifiable precipitating events.  Pain is greater than 10/10 at its worse.  The quality of the pain is cramping, and the pain is relatively constant.  Pain is most severe when laying down, some relief with sitting or standing.  The patient has been sleeping while sitting up.  No exacerbation with cough or bowel movement.  No radiation.  The patient denies loss of bowel or bladder function.  The patient denies any loss of sensation associated with back pain, but notes that he has diabetic peripheral neuropathy in his feet.  The patient denies history of back pain.  At home, the patient took Extra Strength Tylenol and a hot bath with no relief.  The patient was driven to  the emergency department by his wife.  ADMISSION LABORATORY DATA:  CBC within normal limits.  CT of the abdomen was negative for dissection, showed positive gallstones, and a small fatty pancreas.  CT of the chest was also negative for dissection.  A portable chest x-ray was consistent with congestive heart failure.  The EKG showed normal sinus rhythm with no ST changes or Q-waves.  A single set of cardiac enzymes obtained upon admission was not consistent with myocardial infarction.  HOSPITAL COURSE:  #1 - BACK PAIN:  On initial presentation, the patients lower back was tender to palpation diffusely over the lumbosacral spine.  There was no CVA tenderness.  The patient experienced significant pain with laying down during initial physical exam.  The patient also experienced significant pain when raising his legs more than approximately 15 to 20 degrees off the table actively or passively.  Rectal examination was performed at the time of initial evaluation showing a nontender prostate, normal sphincter tone, and the patients stool was guaiac negative.  The patients neuro examination on admission was significant for loss of sensation to pinprick on his heels bilaterally, likely secondary to diabetic neuropathy.  The patients neuro examination was otherwise within normal limits.  The patient was started on Motrin 600 mg p.o. q.6h.,  Flexeril 10 mg p.o. t.i.d., and IV morphine 1 to 2 mg q.4-6h.  The patients pain was relatively well controlled on this regimen. The patient was scheduled for MRI, but the patient was unable to complete this study due to claustrophobia, despite being treated with morphine and Valium for sedation.  The patient attempted MRI a second time with more medications for sedation, 5 mg of morphine IV, and Ativan 4 mg IV, but the patient was unable to complete this study due to claustrophobia.  The patients back pain improved during the course of the admission, such that  the patient was able to ambulate well, and experienced some pain when laying down, but decreased pain relative to admission.  The patient was scheduled for open outpatient MRI at [facility] on Thursday, June 08, 2001.  Note that the patients back pain is likely secondary to muscle spasms, however, MRI study we will rule out other possible etiologies.  #2 - HYPERLIPIDEMIA:  The patient was placed on his home medication of Lipitor 20 mg p.o. q.d.  #3 - INSULIN-DEPENDENT DIABETES MELLITUS:  The patient was placed on Glucophage 1 g p.o. b.i.d. and Avandia 2 mg p.o. b.i.d.  These are the same medications the patient takes at home.  The patient was also placed on insulin 70/30 70 units q.a.m. and 65 units q.p.m.  Additionally, the patient was placed on sliding scale insulin.  During the course of the admission the patients CBGs were frequently elevated to the high 300s, 370s to 390s, and the patient required a significant amount of sliding scale insulin.  Consider obtaining hemoglobin A1C at hospital followup visit in one week.  #4 - OBESITY:  The patient advised that weight loss would help the patient in terms of diabetes, and may also be related to back pain.  #5 - CORONARY ARTERY DISEASE AND UNSTABLE ANGINA:  The patient is status post coronary artery bypass graft in 1993, redo in 1999, and stent placement in 2001.  Last cath in January 2002, showed an ejection fraction of 50% with anterior lateral hypokinesis and 30% stenosis of LIMA graft to the left anterior descending artery.  During this admission, cardiac enzymes x 1 were negative.  EKG showed no ST or T wave changes, and no Q-waves.  The patient did not experience any chest pain.  #6 - THE PATIENT IS STATUS POST LEFT CAROTID ENDARTERECTOMY:  Not active during this admission.  [name] is a patient of Dr. Verdis Frederickson.  FOLLOWUP:  The patient will follow up in [facility] on Monday, June 12, 2001, at 2:45 p.m.  It will be  important to follow up on MRI results at this time. Dictated by:   Sharren Bridge, MS4 Attending Physician:  Madaline Guthrie  DD:  06/08/01 TD:  06/09/01 Job: [id number]

## 2010-11-06 NOTE — Cardiovascular Report (Signed)
[place]. [facility]  Patient:    [name], [name]                      MRN: [id number] Proc. Date: 10/27/00 Adm. Date:  [id number] Attending:  Junious Silk CC:         [facility]  Cecil Cranker, M.D. [facility]  Cardiac Catheterization Lab   Cardiac Catheterization  PROCEDURE:  Left heart catheterization with coronary angiography, left ventriculography, and bypass graft angiography.  CARDIOLOGIST:  Daisey Must, M.D. [facility]  INDICATION:  [name] is a 65 year old male who has undergone two previous bypass surgeries.  He has also undergone stent placement in the saphenous vein graft to obtuse marginal and in the native distal right coronary artery.  He was admitted yesterday with recurrent chest pain worrisome for unstable angina.  CATHETERIZATION PROCEDURAL NOTE:  A 6-French sheath was placed in the right femoral artery.  Catheters utilized included a 6-French JL4, 6-French JR4, 6-French internal ammary, and 6-French angled pigtail.  Contrast was Omnipaque.  There were no complications.  RESULTS:  HEMODYNAMICS:  Left ventricular pressure 190/40, aortic pressure 190/110. There was no aortic valve gradient.  LEFT VENTRICULOGRAM:  The is moderate akinesis of the anterolateral wall; otherwise wall motion is normal.  Ejection fraction calculated at 49%.  There was no mitral regurgitation.  CORONARY ANGIOGRAPHY: (Right dominant).  Left main has a distal 30% stenosis.  Left anterior descending artery is 100% occluded at its origin.  The distal LAD fills via left internal mammary artery graft.  Left circumflex has a 90% stenosis proximally and 100% occlusion in mid vessel.  There is a small ramus intermediate a 70 followed by an 80% stenosis. The distal circumflex consisting of an obtuse marginal fills via saphenous vein graft.  Right coronary artery has a 40% stenosis proximally, 50% in the mid vessel, and 50% in the  distal vessel.  There appear to be three stents in the distal right coronary artery extending into the A-V groove portion of the right coronary artery.  These stents are widely patent with less than 10% stenosis within the stents.  There is competitive filling of the posterior descending artery via saphenous vein graft.  Through the native right coronary artery, the distal posterolateral branches fill including a small first posterolateral, large second posterolateral, and a small third posterolateral.  Left internal mammary artery to distal LAD is patent throughout its course. This fills the mid and distal LAD.  Retrograde in the mid LAD is a 99% stenosis preceded by 100% occlusion of the LAD.  Saphenous vein graft to obtuse marginal is patent.  In the obtuse marginal itself is a diffuse 50% stenosis after the vein graft insertion.  There is a free right internal mammary artery graft arising from the proximal portion of the saphenous vein graft to the obtuse marginal.  This graft appears to be anastomosed to a high diagonal branch.  This graft is widely patent throughout its course.  Saphenous vein graft to the posterior descending artery has a 30% stenosis in the proximal vessel and 30% in the distal vessel.  There formerly was a sequential portion of this graft extending to the posterolateral branch; however, this is occluded as was seen on previous catheterizations.  Distally in the posterior descending artery is a 90% stenosis in a diffusely diseased, relatively small vessel.  This is not significantly changed from previous catheterization.  IMPRESSIONS: 1. Mildly decreased left ventricular systolic  function. 2. Native three-vessel coronary artery disease as described. 3. Patent grafts as described with the exception of the sequential limb of    a saphenous vein graft to the posterolateral branch.  However, that    posterolateral branch gets adequate perfusion via the native  right coronary    artery.  SUMMARY:  There may be some residual small vessel disease and potential ischemia, particularly from the distal posterior descending artery.  However, the anatomy is relatively stable from previous catheterization.  RECOMMENDATIONS:  Medical therapy. DD:  10/27/00 TD:  10/27/00 Job: [id number]

## 2010-11-06 NOTE — Cardiovascular Report (Signed)
[name], [name] NO.:  [id number]   MEDICAL RECORD NO.:  [id number]          PATIENT TYPE:  INP   LOCATION:  3739                         FACILITY:  [facility]   PHYSICIAN:  Veverly Fells. Excell Seltzer, MD  DATE OF BIRTH:  Nov 10, 1945   DATE OF PROCEDURE:  05/25/2006  DATE OF DISCHARGE:                            CARDIAC CATHETERIZATION   PROCEDURE:  Left heart catheterization, selective coronary angiography,  saphenous vein graft angiography, left internal mammary artery  angiography.   INDICATIONS:  [name] is a very nice 65 year old male presenting  with recurrent angina.  He has known coronary artery disease and is  status post bypass surgery and multiple coronary interventions.  He  presented on November 20 and underwent stenting of the saphenous vein  graft to the obtuse marginal branch of the left circumflex at that time.  He did well with the procedure but has had persistent exertional chest  pain as well as episodes of resting chest pain since discharge home.  He  was referred for re-look cardiac catheterization based on his severe  symptoms.   PROCEDURAL DETAILS:  The risks and indications of the procedure were  explained in detail to the patient.  Informed consent was obtained.  The  left groin was prepped, draped and anesthetized with 1% lidocaine.  Using the modified Seldinger technique, a 6-French sheath was placed in  the left femoral artery.  Views of the native left and right coronary  arteries were taken with a JL-4 catheter and JR-4 catheter.  Saphenous  vein grafts were imaged with a left coronary bypass catheter.  The LIMA  graft was imaged with a LIMA catheter.  It was exchanged out over a  exchange length J-tip wire from the right coronary catheter in the left  subclavian artery.  At the conclusion of the native and graft  angiograms, an angled pigtail catheter was inserted into the left  ventricle and ventricular pressures were recorded.   Pullback across the  aortic valve was done.  Left ventriculography was deferred due to his  recent left ventriculogram.  All catheter exchanges were done over a  guidewire.   FINDINGS:  Aortic pressure 88/50 with a mean of 65, left ventricular  pressure 90/4 with an end diastolic pressure of 12.   Left main stem has mild diffuse disease but no significant obstruction.  It bifurcates into the LAD and left circumflex.  The LAD is 100%  occluded at the first septal perforator.   The left circumflex is occluded proximally.   The right coronary artery has diffuse disease.  It is calcified  throughout its proximal and mid portions.  The proximal vessel has 60%  serial lesions.  The mid portion has a focal 80% lesion.  The distal  vessel also was diffusely diseased.  The right coronary artery  terminates in a PDA branch and posterior AV segment.  It gives off one  large posterolateral branch and one smaller PL branch.  The  posterolateral branch has mild disease throughout.  The PDA branch fills  almost exclusively through graft flow and competitive flow is  seen on  the native right coronary injections but the vessel was not visualized.   SAPHENOUS VEIN GRAFT ANGIOGRAPHY:  Saphenous vein graft to PDA is widely  patent.  The anastomotic site appears normal.  The PDA has an 80% lesion  out in the mid to distal portion of that vessel which is very small.  There is also diffuse disease distally.  Proximal to the graft  insertion, there is serial 50-75% stenosis and the posterior AV segment  and posterolateral branches also fill from graft flow.   The saphenous vein graft to the left circumflex is widely patent.  There  are two stents in that vessel both of which are patent.  There is a  short segment between the stents that has 30% stenosis.  The native  obtuse marginal branch beyond the graft is a small vessel and has areas  of 70-80% stenosis.  The circumflex retrograde fills via the graft  flow.   The free LIMA to intermediate branch is widely patent.  There is no  significant disease throughout the body of the graft or the native ramus  intermedius.  The free RIMA originates from the hood of the saphenous  vein graft to the left circumflex.   The LIMA to LAD is widely patent.  There is some mild nonobstructive  disease in the mid body of the graft that is stable.  The mid and distal  LAD fill exclusively from the graft and are widely patent.  There is a  proximal diagonal branch as well as a very distal diagonal branch near  the apex.  There is no significant angiographic disease throughout the  mid or distal LAD.   ASSESSMENT:  1. Severe native three vessel coronary artery disease.  2. Status post coronary bypass grafting with four patent grafts.  3. Improved left ventricular end diastolic pressure compared to last      catheterization.   PLAN:  I reviewed [name] films carefully and compared them to his  prior angiogram from November 20.  His stented segment in the saphenous  vein graft to the left circumflex is widely patent.  There is disease in  his native right coronary artery which supplies a large posterolateral  branch.  However, the saphenous vein graft to the PDA fills this  territory well.  I think it is more likely that [name] angina is  secondary to small vessel disease which he clearly exhibits.  He has  significant stenoses in his distal PDA as well as the distal obtuse  marginal branch.  Neither of these areas are approachable via  percutaneous techniques as they are very small vessels.  I reviewed his  case with Dr. Glennon Hamilton and we have elected to continue treating him  medically.      Veverly Fells. Excell Seltzer, MD  Electronically Signed     MDC/MEDQ  D:  05/25/2006  T:  05/26/2006  Job:  [id number]   cc:   Cecil Cranker, MD, [facility]  Dina Rich

## 2010-11-06 NOTE — Discharge Summary (Signed)
NAMESAMEUL, TAGLE                         ACCOUNT NO.:  [id number]   MEDICAL RECORD NO.:  [id number]                   PATIENT TYPE:  INP   LOCATION:  2907                                 FACILITY:  [facility]   PHYSICIAN:  Charlies Constable, M.D.                  DATE OF BIRTH:  06/15/1946   DATE OF ADMISSION:  07/10/2003  DATE OF DISCHARGE:  07/12/2003                           DISCHARGE SUMMARY - REFERRING   DISCHARGING PHYSICIAN:  Dr. Charlies Constable.   SUMMARY OF HISTORY:  Mr. Grealish is a 65 year old male who presented to  [facility] with CHF and chest discomfort.  The patient stated that he  felt much worse on the day prior to admission than his usual typical  symptoms.  The patient reported good compliance with medications and  adjusting Lasix usage.  The patient was transferred for further evaluation  and on arrival to [facility], his shortness of breath had improved with diuresis  and transient BiPAP.   PAST MEDICAL HISTORY:  His medical history is notable for:  1. Insulin-dependent diabetes.  2. Dyslipidemia.  3. Prior left carotid endarterectomy.  4. Hypertension.  5. Known coronary artery disease with his bypass surgery in 1993 and 1999.     His last cath was in July 2004 and shows patent grafts to the LIMA, to     the LAD, free RIMA to the diagonal, saphenous vein graft to the OM-1 and     saphenous vein graft to the PDA.  EF was low-normal.   LABORATORY DATA:  On admission, hemoglobin and hematocrit were 13.3 and  39.7, normal indices, platelets 261,000, WBC at 11.7; PT 13.2, PTT 35;  sodium 135, potassium 4.2, BUN 20, creatinine 1.7, glucose 263; BNP 638;  initial CK total/MB were 225 and 8.1 with a relative index of 3.6 and a  troponin of 0.14.  Subsequent CK-MBs were declining.  Troponin:  Second set  was elevated at 0.15 but subsequent troponins were declining.  BNP was 638.  Total cholesterol 202, triglycerides 242, LDH 30, LDL 124.  Subsequent  chemistries showed a  low potassium of 3.3 on the 20th; at the time of  discharge, it was 4.2.  At the time of discharge, BUN and creatinine were 14  and 1.2 and glucose was 259.  BNP on the 20th was 99.4.  Prior to discharge,  hemoglobin and hematocrit were 12.5 and 36.7, normal indices, platelets  234,000, WBC was 8.6.   Prior to discharge, Dr. Juanda Chance ordered a PA and lateral chest x-ray; this  was reported as unchanged from x-ray on July 10, 2003.  This showed  stable cardiomegaly, some pulmonary vascular congestion and chronic  interstitial lung disease.   EKG showed normal sinus rhythm, left atrial enlargement, delayed R wave  progression, T wave inversion in V5 and V6 and I and aVL.   HOSPITAL COURSE:  Ms. Prabhu was admitted to [facility]  to the TCU and  it was felt that his enzymes and symptoms were compatible with a non-Q-wave  myocardial infarction.  Dr. Juanda Chance felt that he should undergo cardiac  catheterization and this was performed by Dr. Juanda Chance on July 10, 2003.  According to his progress note, he had native three-vessel coronary artery  disease.  The LIMA to the LAD had a proximal 30-40% stenosis.  A saphenous  vein graft to __________ noted to have a prior stent and a 50-60% stenosis  in the distal portion; it was hazy.  The portion of stenosis also showed  some new haziness.  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  After review, Dr.  Arturo Morton. Stuckey did not feel that the patient had great options and  favored medical treatment.  Post sheath removal and bedrest, catheterization  site was intact.  His glucoses were noted to be elevated and around-the-  clock coverage was increased.  On July 11, 2003, Rhonda supplemented his  hypokalemia; she also noted that the patient had some episodes of  hypotension and his beta blocker was decreased to 50 mg b.i.d.  His Prinivil  and Lasix were placed on hold.   However, on July 12, 2003, his blood  pressure was improved.  The patient stated that he felt much better and Dr.  Juanda Chance felt that he could be discharged to home.  Just prior to discharge,  it was noted that Dr. Juanda Chance did order a chest x-ray and a BNP as previously  described.   DISPOSITION:  1. Acute pulmonary edema associated with non-Q-wave myocardial infarction.     Continue medical treatment.  2. Hypokalemia.  3. Hyperlipidemia.  4. Hypotension.  5. Hyperglycemia with a history of diabetes.  6. History as previously.   DISPOSITION:  1. He is asked to decrease his Toprol-XL to 50 mg b.i.d.  2. His Lipitor was increased to 80 mg nightly.  3. His Lasix was increased to 80 mg q.a.m. and Lasix 40 mg around 4 p.m.  4. New medication was also given to him -- Plavix 75 mg daily.  5. His Isordil was increased to 10 mg t.i.d.   He was asked to continue his:  1. Accupril 40 mg daily.  2. Aspirin 325 mg daily.  3. Insulin 70/30 -- 75 units q.a.m., 65 units q.p.m.  4. Glucophage 2000 mg daily.  5. Nitroglycerin 0.4 mg as needed.   DIET:  He was advised to maintain a low-salt/-fat/-cholesterol ADA diet.   SPECIAL DISCHARGE INSTRUCTIONS:  If he had any problems with his  catheterization site, he was asked to call us.   FOLLOWUP:  He will follow up with Dr. Cecil Cranker on July 30, 2003  at 9:45; at that time, arrangements should be given to consider doing a BMP  to follow up on his potassium level with adjustment of medications, as well  as arranging fasting lipids and LFTs for approximately 6 to 8 weeks, since  his Lipitor was increased.  Consideration should also be given to aggressive  cardiac risk factors modification, especially in regards to control of blood  sugars, and this should be followed up with his primary care M.D. and  consider evaluating a hemoglobin A1c to determine control at home.     Joellyn Rued, P.A. [facility]                    Bruce  Juanda Chance, M.D.    EW/MEDQ  D:  07/12/2003  T:  07/13/2003  Job:  [id number]   cc:   Cecil Cranker, M.D.

## 2010-11-06 NOTE — H&P (Signed)
[facility] ADMISSION   [name], [name]                      MRN:          [id number]  DATE:05/09/2006                            DOB:          21-Sep-1945    PRIMARY CARDIOLOGIST:  Cecil Cranker, MD, [facility]   PRIMARY CARE PHYSICIAN:  Dina Rich, M.D.   PATIENT PROFILE:  The patient is a 65 year old, married, white male with  prior history of coronary artery disease who presents with recurrent  unstable angina.   PROBLEMS:  1. Coronary artery disease.      a.     Status post coronary artery bypass grafting in 1993 with       redo coronary artery bypass grafting in 1999.      b.     July 10, 2003, cardiac catheterization revealing native       three-vessel coronary artery disease.  Patent left internal       mammary artery to the left anterior descending, patient vein graft       to the posterior descending artery but with distal disease in the       native posterior descending artery.  There was new evidence of       thrombus of the distal-most aspect of the vein graft to the obtuse       marginal which leads to a severely diseased obtuse marginal       branch, the continued patency of the free right internal mammary       to the small diagonal.  The patient was medically managed.      c.     Oct 20, 2004, adenosine Myoview revealing an ejection       fraction of 41% with anterior and apical infarct as well as       akinesis of the areas without any evidence of ischemia.      d.     May 05, 2006, 2-D echocardiogram performed in [place]       revealing an EF of 55% with impaired relaxation/possible diastolic       dysfunction.  There was mild mitral regurgitation and moderate       left atrial enlargement.  There was physiologic tricuspid       regurgitation with mild pulmonary hypertension and right atrial       enlargement.  2. HTN  3. HL  4. DM II  5. PVD s/p L CEA  6. Obesity   HISTORY OF PRESENT ILLNESS:  A 65 year old, married, white male with the  above problem list whose last catheterization was in January 2005, last  stress test was in May 2006 and did not reveal ischemia at that time.  He has chronic dyspnea on exertion to some degree.  However, this has  significantly worsened over the past month or so.  Now noting dyspnea  with minimal exertion as well as rest, exertional, and occasionally  nocturnal 2 to 8 out of 10 substernal chest pressure and squeezing  associated with shortness of breath and occasional nausea, generally  lasting 10  minutes and resolving with between two and five sublingual  nitroglycerin sprays, occurring approximately every other day.  He notes  that these symptoms are exactly similar to his previous episodes of  angina.  He, therefore, saw his primary care physician who obtained a 2-  D echocardiogram revealing normal left ventricular function with  diastolic dysfunction.  The patient also thinks that he has had  increasing lumps from the edema as well as stable two- to three-pillow  orthopnea.  He does not think he has had any significant weight or  appetite change.  After being seen by his primary care physician,  decision was made to have him seen here for additional evaluation.  Currently he is pain-free but again feels as though he is experiencing  his anginal equivalent and feels that likely it is time to take a look.   ALLERGIES:  NORVASC causes ankle swelling.   HOME MEDICATIONS:  1. Coreg 25 mg b.i.d.  2. Lasix 40 mg daily.  3. Imdur 60 mg daily.  4. Lipitor 80 mg daily.  5. Zetia 100 mg daily.  6. Lantus as directed.  7. Humalog as directed.  8. Aspirin 81 mg daily.  9. Diovan 160 mg b.i.d.  10.Zoloft 100 mg daily.  11.Nitroglycerin 0.4 mg spray p.r.n.   FAMILY HISTORY:  His father is age 11 with a history of MI and CABG.  His mother is age 7 with history of hypertension now suppressed.  He  has one brother  who he thinks has had some heart problems and another  brother who died of an MI at age 44.   SOCIAL HISTORY:  He lives in [place] with his wife and son who is a  Consulting civil engineer at the [employer] [place].  He denies any tobacco, alcohol  or drug use in his lifetime. He does not routinely exercise as his  activities are severely limited by chest pain and dyspnea.   REVIEW OF SYSTEMS:  He denies any constipation, diarrhea, rash, lesions,  masses, nausea, vomiting, fevers, chills, or difficulty urinating.  He  is diabetic.  Otherwise with the exception of what is listed in the HPI,  also systems reviewed and negative.   PHYSICAL EXAMINATION:  VITAL SIGNS:  Blood pressure 160/80, heart rate  78, respirations 18, weight 250 pounds.  GENERAL:  Presently in no acute distress.  Awake, alert and oriented x3.  NECK:  Obese without any evidence of bruits or jugular venous  distension.  LUNGS:  Respirations __________ .  Clear to auscultation.  CARDIAC:  Regular. S1 and S2.  No S3 or S4 or murmurs.  ABDOMEN:  Round, soft, nontender, nondistended.  Bowel sounds are  present x4.  EXTREMITIES:  Warm, dry and pink with trace to 1+ bilateral lower  extremity edema.  Dorsalis pedis pulse and posterior tibial pulses are  1+ and equal bilaterally.  He has strong femoral pulses without any  bruits.   CLINICAL FINDINGS:  EKG was performed today showing sinus rhythm with  first-degree AV block and left atrial enlargement.  Otherwise no acute  ST-T changes.  He will have CBC, BMET, PT/PTT as well as a chest x-ray  performed tomorrow.   ASSESSMENT/PLAN:  1. Unstable angina/coronary artery disease.  The patient presents with      approximately one-month history of worsening dyspnea as well as      more frequent, progressive angina.  This case has been discussed      with Dr. Riley Kill and we will  arrange for outpatient cardiac      catheterization to take place in the main cath lab with Dr. Riley Kill     at  the patient's request.  We will continue his aspirin, ARB, beta      blocker, nitrate and stat therapy.  I offered to go up on his      Imdur; however, he notes that he gets significant headaches at      doses higher than 60 mg.  He does have sublingual nitroglycerin at      home and is advised that if he has any recurrent or worsening chest      pain or dyspnea today, that he should have a very low threshold for      presenting to the [facility] as he does live in      [place].  The patient does verbalize understanding.  2. Hypertension.  Blood pressure is elevated this morning and notes      that it does run high at home as well.  He is already on high dose      Coreg and Diovan.  He has tried Norvasc in the past which resulted      in ankle edema.  Will go up on his Lasix to 40 mg b.i.d. as he does      have some lower extremity edema as well.  We will obtain a BMET      tomorrow precath.  Because we have increased his Lasix, I have      given him a prescription for K-Dur 20 mEq daily.  3. Obesity.  The patient would likely benefit from cardiac rehab.  4. Hyperlipidemia.  He remains on Lipitor therapy.  Supposing he will      be admitted tomorrow, we will check lipids and LFTs.  5. Type 2 diabetes mellitus.  He is on Lantus and Humalog at home and      this is followed by Dr. Sol Passer.   DISPOSITION:  We will make arrangements for outpatient cardiac  catheterization to be performed tomorrow, November 20, by Dr. Riley Kill in  the [facility] cath lab.  He will follow up with Dr. Glennon Hamilton  approximately following that.      [name], ANP  Electronically Signed      Arturo Morton. Riley Kill, MD, [facility]  Electronically Signed   CB/MedQ  DD: 05/09/2006  DT: 05/09/2006  Job #: [id number]   cc:   Dina Rich

## 2010-11-06 NOTE — Discharge Summary (Signed)
[place]  Patient:    [name], [name]                      MRN: [id number] Adm. Date:  [id number] Disc. Date: 10/28/00 Attending:  Junious Silk Dictator:   Joellyn Rued, P.A.-C.                  Referring Physician Discharge [place]  DATE OF BIRTH:  May 16, 1946  ADMITTING PHYSICIAN:  Cecil Cranker, M.D.  SUMMARY OF HISTORY:  [name] is a 65 year old white male who was seen in the office on Oct 26, 2000.  Over the preceding three weeks he has had typical symptoms of unstable angina occurring at rest and with exertion and has had three episodes a day; the most recent was at rest, thus his admission.  His history is notable for hyperlipidemia, insulin-dependent diabetes, exogenous obesity, coronary artery disease, bypass surgery in 1993 with redo in March 1999.  Stenting was performed in June 2001 to the saphenous vein graft to the circumflex.  Most recent catheterization was in January 2002 by Dr. Riley Kill at which time the LIMA to the LAD showed mild 30% irregularities.  The remaining grafts were patent.  The prior stent sites were also unremarkable.  Ejection fraction was 50% with anterolateral hypokinesis.  LABORATORY DATA:  Admission sodium 136, potassium 4.5, BUN 25, creatinine 1.2, glucose 236.  PT 12.9, PTT 24.  H&H 11.2 and 32.9, normal indices, platelets 325, wbcs 12.1.  CK-MBs were slightly elevated; however, relative indexes and troponins were within normal limits.  EKG showed normal sinus rhythm, no acute changes, delayed R wave progression.  HOSPITAL COURSE:  Cardiac catheterization was performed on [name] by Dr. Gerri Spore.  According to his progress notes, his grafts were patent.  He did have a distal 30% stenosis in the saphenous vein graft to the OM and a proximal 30% and distal 30% lesion in the saphenous vein graft to the PDA. Sequential to the posterolateral branch was totaled.  Ejection fraction was 49%  with moderate anterolateral akinesis.  After reviewing it was felt that he should continue medical treatment.  Post catheterization he did drop his blood pressure, which responded to fluids.  Post sheath removal and bedrest he was ambulating without difficulty with some residual right groin soreness.  By May 10 he was ambulating without difficulty.  Dr. Dietrich Pates felt that he could be discharged home.  DISCHARGE DIAGNOSES: 1. Chest discomfort of unknown etiology. 2. Catheterization as previously described. 3. History as previously described.  MEDICATIONS:  He is given a new prescription for Norvasc 2.5 mg q.d. Also wrote out new prescriptions for his Lasix 40 mg q.d. and spironolactone 25 q.d.  He was asked to resume his Glucophage 1000 mg b.i.d. on Saturday, continue taking his Lipitor 20 mg q.h.s., Toprol-XL 150 b.i.d., Accupril 30 q.d., insulin 70/30 75 units q.a.m. and 65 q.p.m., Plavix 75 q.d., Imdur 30 b.i.d., and sublingual nitroglycerin as needed.  ACTIVITY:  He was advised no lifting, driving, sexual activity, or heavy exertion for two days.  DIET:  Maintain low salt/fat/cholesterol ADA diet.  WOUND CARE:  If he had any problems with his catheterization site he was asked to call.  FOLLOW-UP:  He will receive a telephone call in about two weeks from the [facility] diabetes management center to reschedule his classes.  Our office will also call him with a one-month follow-up with Dr. Glennon Hamilton. DD:  10/28/00 TD:  10/28/00 Job: [id number]

## 2010-11-06 NOTE — Cardiovascular Report (Signed)
[place]  Patient:    [name], [name]                      MRN: [id number] Proc. Date: 06/28/00 Adm. Date:  [id number] Disc. Date: [id number] Attending:  Junious Silk CC:         Cecil Cranker, M.D. [facility]  Bruce R. Juanda Chance, M.D. [facility]  CV Laboratory   Cardiac Catheterization  INDICATIONS:  [name] is a 65 year old gentleman, who had re-do bypass surgery in 1999.  He subsequently had percutaneous intervention of the saphenous vein graft to the obtuse marginal.  He then developed recurrent symptoms and was noted to have an occlusion of his graft to the posterolateral branch.  He had intervention on multiple lesions in the distal right coronary. He now presents with recurrent symptomatology and is brought back to the catheterization lab for further evaluation.  PROCEDURES: 1. Left heart catheterization. 2. Selective coronary arteriography. 3. Selective left ventriculography. 4. Saphenous vein graft angiography x 2. 5. Selective internal mammary angiography.  DESCRIPTION OF PROCEDURE:  The procedure was performed from the right femoral artery using 6 French catheters.  He tolerated the procedure without complication.  We subsequently took him off the table into the holding area in satisfactory clinical condition.  HEMODYNAMICS:  The central aortic pressure was 153/93.  LV pressure 159/33. No gradient on pullback across the aortic valve.  ANGIOGRAPHIC DATA:  The left main coronary artery is free of critical disease.  The LAD is subtotally occluded leading up to about the diagonal.  The LAD is occluded beyond the origin of the diagonal.  The diagonal itself has some irregularity and is an extremely small vessel.  There is competitive filling of the distal vessel seen.  The native circumflex also has 90% proximal narrowing that is essentially totally occluded leading into the marginal.  The right coronary artery is patent.   There is perhaps 30% proximal narrowing. There then is about 50% narrowing at the takeoff of the right ventricular branch.  There is some slight irregularity after this with up to about 40% eccentric narrowing.  In the multiple sites that are stented, in the distal vessel, there appear to be widely patent without evidence of significant recurrent narrowing.  There is competitive filling in the PDA from the patients graft suggesting some protection distally overall.  The right ventricular branch itself has about a 90% narrowing supplying predominately the RV free wall.  The saphenous vein graft to the OM appears to be widely patent.  There is no significant re-narrowing at the previously placed stent site.  Arising from this graft is the free RIMA which goes to the diagonal branch and is also patent.  There is perhaps 40-50% narrowing going into the marginal branch beyond the graft insertion site.  The diagonal vessel itself is relatively small in caliber.  The vein graft leading into the posterior descending branch appears to be widely patent.  There is retrograde filling of this vessel into the posterolateral system with what appears to be about a 90% stenosis in the proximal PDA which fills retrograde from the graft insertion.  Distal to the graft insertion is a diffusely diseased vessel with a mid stenosis of about 90% in a small caliber overall distal vessel.  The internal mammary to the LAD demonstrates an area of some luminal irregularity in the proximal to midportion of the graft.  However, this does not appear to be dramatically changed  from the previous study.  The graft itself inserts into the LAD which provides an LAD which wraps the apex.  There does not appear to be critical disease beyond the LAD graft insertion site.  LEFT VENTRICULOGRAPHY:  Ventriculography in the RAO projection reveals perhaps mild anterolateral hypokinesis.  Significant mitral regurgitation is  not present.  CONCLUSIONS: 1. Minimal reduction in global left ventricular function with mild    anterolateral hypokinesis. 2. Continued patency of the internal mammary to the left anterior descending    with some irregularity at the proximal and mid graft, but without    significant focal narrowing. 3. Continued patency of the stent site in the obtuse marginal graft with    some mild disease distally. 4. Continued patency of the right internal mammary artery to the diagonal. 5. Continued patency of the saphenous vein graft to the posterior descending    artery with occlusion of the limb from the posterior descending artery to    the posterolateral system as previously described and continued patency    of the stent sites in the distal right coronary circulation with some    diffuse irregularities of the right coronary as noted previously.  DISPOSITION:  I plan to have Dr. Juanda Chance to review these films.  At the present time, I would likely recommend continued medical therapy.  There does not appear to be significant justification for percutaneous intervention at this time. DD:  07/03/00 TD:  07/03/00 Job: [id number]

## 2010-11-06 NOTE — Consult Note (Signed)
[place]  Patient:    [name], [name] Visit Number: [id number] MRN: [id number]          Service Type: MED Location: [phone] Attending Physician:  Edwyna Perfect Dictated by:   Everardo Beals Juanda Chance, M.D. [facility] Proc. Date: 01/08/02 Admit Date:  01/08/2002 Discharge Date: 01/11/2002                            Consultation Report  PRIMARY CARE PHYSICIAN: [facility] Internal Medicine Teaching Service.  PRIMARY CARDIOLOGIST: Cecil Cranker, M.D.  CHIEF COMPLAINT: Chest pain.  CLINICAL HISTORY: The patient is a 65 year old gentleman, well known to use who had initial bypass surgery in 1993 and re-do bypass surgery in 1999. He subsequently had stenting of the saphenous vein graft to the marginal branch of the circumflex artery in 2001, and later had tandem stents placed in the distal native right coronary artery by myself also in 2001. He did well until about two weeks ago when he developed recurrent chest pain, which he described as substernal pressure with some radiation to his jaw, both at rest and with exercise and associated with shortness of breath and diaphoresis. Symptoms became worse and he came to the clinic today and was admitted with a diagnosis of unstable angina.  PAST MEDICAL HISTORY: Significant for insulin-dependent diabetes. He also has hypertension, hyperlipidemia, and he has had a weight problem with weights as high as 400 pounds, but he has been down to 260 pounds recently. At his last CABG in 1999 he had a LIMA placed in the LAD, a RIMA placed to the intermediate and a vein graft to the circumflex and a vein graft to the posterior descending and posterolateral branch to the right coronary artery. His ejection fraction at last catheterization was 50%.  CURRENT MEDICATIONS: 1. Toprol-XL 50 mg. 2. Glucophage 1000 mg b.i.d. 3. Aspirin 81 mg daily. 4. Insulin 70/30 70 units in the a.m. and 65 units in the p.m. 5. Imdur 60 mg  daily. 6. Lasix 40 mg daily. 7. Accupril 20 mg daily. 8. Nitroglycerin p.r.n.  SOCIAL HISTORY: He is disabled and lives with his wife.  For details of family history and review of systems, please see Horton Chin, PA note.  PHYSICAL EXAMINATION:  VITAL SIGNS: On examination today, the blood pressure is 164/87 and the pulse 90 and regular. The O2 saturation was 93%.  HEENT: Pupils equal, round, and extraocular movements were full.  NECK: Showed no venous distention. There was a left carotid endarterectomy scar and faint bilateral bruits. There was no venous distention.  CHEST: Clear without rales or rhonchi.  CARDIAC: The cardiac examination showed a regular rhythm. The first and second heart sounds were normal. There were no gallops and there was a 2/6 systolic ejection murmur at the left sternal edge.  ABDOMEN: Soft and obese. The bowel sounds were normal. There is no hepatosplenomegaly.  EXTREMITIES: Showed some chronic stasis changes and 2+ edema. The pedal pulses were equal.  SKIN: Warm and dry.  NEUROLOGICAL: Examination showed no focal neurological signs.  MUSCULOSKELETAL: Showed no deformities.  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  LABORATORY DATA: Initially laboratory studies showed a BUN of 16 and creatinine 1.2. Hemoglobin of 14.0 and hematocrit of 42% and a CK of 244 with an MB of 5.8 and a troponin of 0.02.  IMPRESSION: 1. Unstable angina with recurrent chest pain. 2. Coronary artery disease, status  post re-do bypass surgery in 1999 and    status post subsequent percutaneous intervention as described above in    2001. 3. Good left ventricular function with ejection fraction of 50%. 4. Insulin-dependent diabetes mellitus. 5. Hypertension. 6. Hyperlipidemia. 7. Status post left carotid endarterectomy.  RECOMMENDATIONS: I agree with the initial diagnosis and the initial treatment with heparin, aspirin and beta blockers. Because  of recurrent chest pain, I would recommend treatment with Integrilin and because it is unlikely that the patient will require another bypass surgery, I would recommend treatment with Plavix as well. I think he should be evaluated with angiography, and we scheduled this for Wednesday.  Thanks very much for asking me to see this patient. Dictated by:   Everardo Beals Juanda Chance, M.D. [facility] Attending Physician:  Edwyna Perfect DD:  01/08/02 TD:  01/12/02 Job: [id number]

## 2010-11-06 NOTE — H&P (Signed)
[facility] ADMISSION   [name], [name]                      MRN:          [id number]  DATE:05/24/2006                            DOB:          03-30-1946    He is a very pleasant obese 65 year old male status post CABG in 1993,  with redo in 1999, who presented to the office on May 10, 2006 with  unstable angina.  He was admitted for elective coronary angiography per  Dr. Riley Kill.  There was a high-grade lesion in the large obtuse marginal  graft, successfully treated with bare metal stenting by Dr. Tonny Bollman.  The patient was discharged the next day.  It was noted that he  had a mild elevation in temperature and had a white count of 13,400 at  the time of discharge.   Review of the angiogram today also reveals that the SVG to the posterior  descending appeared to have a lesion at the anastomosis of possibly 70%  or more.   The native RCA had a 70-80% lesion in the mid-RCA.  The LIMA to LAD was  patent, and RIMA which originated from the site of the OM, free RIMA  appeared to arise from near the origin of the saphenous vein graft to  the circumflex.  The RIMA was anastomosed to the intermediate.   The patient states he was discharged on Thanksgiving Day and has had  pain almost daily since that time.  He had severe pain on Sunday, three  days after Thanksgiving and went to the emergency room in [place].  We  do not have any of that data.  He was told that he did not appear to  have a heart attack.   Subsequently, the patient continued to have symptoms while driving up  here from [place] today.  It usually occurs with exertion and is  relieved probably with nitroglycerin or rest.   The patient has known hyperlipidemia, insulin-dependent diabetes, and  ischemic cardiomyopathy, with previous EF of 35-45% by echocardiogram of  May 2006.  LV-gram done recently appeared to reveal an EF in this  range.   The patient was also thought to have mild congestive heart failure at  that time.   DISCHARGE MEDICATIONS:  1. Plavix 75.  2. Coreg 25 b.i.d.  3. Lasix 40 b.i.d.  4. Imdur 60.  5. Lipitor 80.  6. Zetia 100.  7. Lantus insulin as directed.  8. Coated aspirin 325.  9. Diovan 160.  10.Zoloft 100.  11.K-Dur 20.  12.Nitrostat 0.4.   PAST HISTORY/REVIEW OF SYSTEMS:  As noted before.   ALLERGIES:  None.   The patient has also had a left carotid endarterectomy.   FAMILY HISTORY:  Father had an MI and is status post CABG.   SOCIAL HISTORY:  He is a retired Teacher, English as a foreign language.  He has  two sons and one daughter.  He does not have a history of alcohol,  cigarettes, or drug abuse.   REVIEW OF SYSTEMS:  As noted recently.   PHYSICAL EXAMINATION:  VITAL SIGNS:  Blood pressure 144/86, pulse 78,  normal sinus rhythm.  GENERAL:  Appears obese.  NECK:  JVP is not elevated.  Carotid pulses palpable, without bruits.  LUNGS:  Clear.  CARDIAC:  Reveals no murmur or gallop.  ABDOMEN:  Obese.  Right femoral artery palpable.  Left is palpable, but  somewhat decreased.  EXTREMITIES:  Reveal trace edema.  NEUROLOGIC:  Unremarkable.   IMPRESSION:  1. Coronary artery disease, with unstable angina two weeks post      percutaneous intervention of high-grade lesion in saphenous vein      graft to the circumflex.  2. Long history of coronary artery disease, with coronary artery      bypass graft in 1993, redo in 1999.  3. Hypertension.  4. Hyperlipidemia.  5. History of cerebrovascular accident, with left carotid      endarterectomy.  6. Recent congestive heart failure.  7. Diabetes.   Because of the recurring daily chest discomfort postprocedure, I suggest  that the patient be readmitted.  I think he will need to be  recatheterized.  If the stented vein graft remains patent, then I think  we will need to consider possibly intervening on the saphenous vein  graft to the  right or the native RCA.  We also need 2D echocardiogram to  evaluate his EF.  I should note that EKG today reveals normal sinus  rhythm with first-degree AV block, prolonged QT.  The patient was  agreeable with this approach.  I will discuss the patient with Dr.  Riley Kill as well.     Cecil Cranker, MD, [facility]  Electronically Signed   EJL/MedQ  DD: 05/24/2006  DT: 05/24/2006  Job #: [id number]

## 2010-11-06 NOTE — Cardiovascular Report (Signed)
NAMEKENSHIN, SPLAWN NO.:  [id number]   MEDICAL RECORD NO.:  [id number]          PATIENT TYPE:  INP   LOCATION:  6523                         FACILITY:  [facility]   PHYSICIAN:  Veverly Fells. Excell Seltzer, MD  DATE OF BIRTH:  Dec 27, 1945   DATE OF PROCEDURE:  05/11/2006  DATE OF DISCHARGE:                              CARDIAC CATHETERIZATION   PROCEDURE:  Saphenous vein graft intervention on saphenous vein graft to the  obtuse marginal branch of the left circumflex.  Proximal embolic protection  used with the Proxis device and primary stenting with a 3.5 x 24 mm Liberte  stent followed by postdilatation with a 4.0 x 20 mm Quantum Maverick  balloon.   INDICATIONS:  Mr. Linn is a very nice 65 year old gentleman who presented  with crescendo angina.  He has known coronary artery disease and is status  post prior bypass surgery and multiple percutaneous interventions.   Dictation ended at this point.      Veverly Fells. Excell Seltzer, MD  Electronically Signed     MDC/MEDQ  D:  05/11/2006  T:  05/11/2006  Job:  [id number]   cc:   Arturo Morton. Riley Kill, MD, [facility]

## 2010-11-06 NOTE — Discharge Summary (Signed)
[place]  Patient:    [name], [name] Visit Number: [id number] MRN: [id number]          Service Type: MED Location: [phone] Attending Physician:  Edwyna Perfect Dictated by:   Kennith Gain, M.D. Admit Date:  01/08/2002 Discharge Date: 01/11/2002   CC:         Shary Decamp, MD  Cephas Darby, MD, [facility]   Discharge Summary  PRIMARY CARE PHYSICIAN:  Dr. Shary Decamp, department of internal medicine.  CONSULTANTS:  Dr. Everardo Beals. [facility].  DISCHARGE DIAGNOSES: 1. Unstable angina. 2. Coronary artery disease status coronary artery bypass graft x2. 3. Percutaneous transluminal coronary angioplasty. 4. Hypertension. 5. Type II diabetes mellitus. 6. Hyperlipidemia. 7. Congestive heart failure. 8. Obesity. 9. Chronic back pain.  DISCHARGE MEDICATIONS: 1. Lopressor 50 mg p.o. q.d. 2. Accupril 20 mg p.o. q.d. 3. Imdur 60 mg p.o. q.d. 4. Lasix 40 mg p.o. q.d. 5. Aspirin 81 mg p.o. q.d. 6. Insulin 70/30, 70 units q.a.m. and 65 units q.p.m. 7. Glucophage XL 2000 mg p.o. q.d., this medications is to be restarted on    Saturday 12/24/01. 8. Darvocet-N 100 mg p.o. q.4h. p.r.n. back pain. 9. Nitroglycerin sublingual 0.4 mg p.r.n. for chest pain.  DISPOSITION:  The patient was discharged home with his son in good condition.  FOLLOWUP:  The patient is to follow up in the outpatient clinic with Dr. Ronne Binning in approximately one week.  We will call him with this appointment date and time.  He was also to follow up with Dr. Jarvis Newcomer, [facility] on Wednesday, March 26, 2002 at 3:15 PM.  Issues at followup include close blood pressure monitoring.  Cardiology feels that he may benefit from an ambulatory blood pressure monitor to see how his blood is regulated during a normal day.  While he was here, they attempted increasing his beta-blockade for further cardiac benefit.  Unfortunately, however,  the patient was unable to tolerate this.  He became hypotensive.  I discussed at length to cardiology team the patients appropriate discharged medications. They felt it was safe to discharge him home on his preadmission regimen.  This regimen is to be adjusted as needed for optimal blood pressure control. Specifically, they recommended increasing his beta-blocker, ACE inhibitor, and diuretics.  This will need to be addressed both by the primary care physician and by the cardiology team and followup.  Additionally, the initial note from cardiology mentioned treating the patient with Plavix in addition to aspirin. The patient was now discharged on Plavix.  This may need to be discussed at followup.  PROCEDURE:  The patient underwent a cardiac catheterization by Dr. Juanda Chance on January 10, 2002.  CONSULTS:  Dr. Juanda Chance with [facility] saw this patient in consultation.  We appreciated his help in managing this patient.  ADMISSION HISTORY OF PRESENT ILLNESS:  The patient is a 65 year old white male with a past medical history significant for coronary artery disease status post CABG x2 and PTCA, hypertension, diabetes mellitus and hyperlipidemia, who presented to the [facility] clinic, with 2-3 weeks history of chest and neck pain. The patient reports that he has been having multiple episodes of pain now for several weeks.  He states that the pain can come on at anytime whether walking, driving or watching TV.  He describes the pain as a pressure-type pain that begins at his chest and radiates up into his neck and into his left jaw.  He states that  the pain can be relieved by nitroglycerin; however, he has taken up to 15 nitroglycerin in order to control the pain at its worst episodes.  He does report that exercise worsens the pain, but it can come over at anytime.  The pain is very similar to his previous cardiac pain that he had had prior to his CABG and PTCA.  The patient denies any nausea or  vomiting or diaphoresis.  PHYSICAL EXAMINATION (ON ADMISSION):  VITAL SIGNS:  Temperature 98.2, pulse 100, blood pressure 189/103, respiratory rate 12, and oxygen saturation 98% on room air.  GENERAL:  The patient was nontoxic and in no acute distress.  He was alert and oriented x4.  He was pleasant and did not experience any chest pains at the time of the exam.  HEENT:  Normocephalic and atraumatic.  Pupils are equal, round, and reactive to light and accommodation.  Extraocular muscles are intact.  Nares are patent.  The oropharynx is clear with moist mucous membranes and upper dentures in place.  NECK:  No JVD was appreciated.  No bruits were appreciated.  No lymphadenopathy was appreciated.  CARDIOVASCULAR:  Regular rate and rhythm with a 2/6 systolic ejection murmur heard at the right upper sternal border.  LUNGS:  Clear to auscultation bilaterally without wheezes or rhonchi.  CHEST:  There was a large midline scar extending from the external notch all the way down to the lower portion of the patients abdomen.  ABDOMEN:  Obese.  Soft, nontender, and nondistended.  Positive bowel sounds. Midline abdominal scar was well healed.  EXTREMITIES:  1+ pitting edema in bilateral lower extremities.  NEUROLOGIC:  Cranial nerves II through XII were intact.  No focal, motor or sensory deficits were appreciated.  SKIN:  Dry, scaly skin on the bilateral upper and lower extremities.  LABORATORY DATA (ON ADMISSION):  White blood cell count 8.6, hemoglobin 14.0, hematocrit 42.1, platelets 257,000, sodium 139, potassium 4.8, chloride 105, bicarbonate 24, BUN 16, creatinine 1.2, glucose 49, calcium 9.4, T bili 0.5, alkaline phosphatase 107, AST 22, ALT 17, protein 7.1, albumin 3.6, PT 12.6, INR 0.9, PTT 28, initial CK was 244, initial CK-MB 5.8, initial index 2.4, initial troponins 0.02.  An EKG was done, which revealed normal sinus rhythm  with no significant change from previous  EKG.  HOSPITAL COURSE:  The patient was admitted and serial enzymes were followed. He was started on oxygen by nasal cannula.  He was given aspirin.  He was started on a nitro drip for adequate pain control with IV morphine also available for pain control.  We asked Cardiology to see this patient in consult.  They did evaluate the patient and felt that he would also benefit from Integrilin.  This was indeed started.  Cardiologist also felt that the patient would benefit from a cardiac catheterization to further evaluate his patency of his previous grafts.  The patient did well prior to the catheterization.  He did have periodic episodes of chest pain, which was well controlled with the nitroglycerin drip.  Serial enzymes were followed.  His CK peaked at 274, his CK-MB peaked at 9.1, and his index peaked at 3.3.  His troponin never pumped and remained stable at 0.02.  The patient was taken for cardiac catheterization on January 10, 2002.  Catheterization revealed an ejection fraction at 35 to 40%.  Catheterization revealed no source of ischemia.  It was the cardiologists impression that the symptoms may be related to worsening left ventricular systolic function and elevated  filling pressures.  As a result, his recommendation was that we increase the ACE inhibitor, the diuretic, and titrate the blood pressure to a heart rate of 60. The patient did well following the catheterization and hemostasis was achieved in the patients groin without problem.  The following day, the patient was ready for discharge to home.  I reviewed the discharge medications with the cardiology team.  It was felt that since the patient was given multiple medications during the catheterization and also required having some medications held secondary to hypotension while in the hospital, it will be best to send the patient home on his preadmission regimen with close followup for blood pressure control.  Therefore, the  patient was put back on his pre-hospital medical regimen.  He will be followed up by both his primary care physician and by cardiology at which time further adjustments to his medical regimen may be made.  #1.  DIABETES:  The patient was placed back on his 70/30 while in the hospital.  His Glucophage was held secondary to cardiac catheterization. He was instructed not to begin taking the Glucophage until Saturday, which will be greater than 48 hours postcatheterization.  His hemoglobin A1C was found to be 9.9 during this admission.  Obviously, further glycemia control was needed. This should be addressed by his primary care physician.  #2.  HYPERLIPIDEMIA:   A fasting lipid panel was drawn, which revealed an HDL at 38, LDL at 73, and total cholesterol of 140, and triglycerides of 143.  It appears that his lipids are under control with diet alone and has no antihyperlipidemic agents were initiated.  #3.  CHRONIC BACK PAIN:   He was given a prescription for Darvocet.  The patient did not receive any pain medications during the hospitalization and this pain medication may not be needed.  This is something, which may be followed up as an outpatient by his primary care physician.  LABORATORY DATA ON DISCHARGE:  White blood cell count 10.6, hemoglobin 13.5, hematocrit 39.9, platelets 247, sodium 136, potassium 3.6, chloride 100, bicarbonate 27, BUN 20, creatinine 1.1, glucose 99, and hemoglobin A1C is 9.9. Dictated by:   Kennith Gain, M.D. Attending Physician:  Edwyna Perfect DD:  01/11/02 TD:  01/16/02 Job: [id number]

## 2010-11-06 NOTE — Discharge Summary (Signed)
NAMEANTRON, [name]                         ACCOUNT NO.:  [id number]   MEDICAL RECORD NO.:  [id number]                   PATIENT TYPE:  INP   LOCATION:  3705                                 FACILITY:  [facility]   PHYSICIAN:  Tawny Asal, MD             DATE OF BIRTH:  Oct 22, 1945   DATE OF ADMISSION:  01/07/2003  DATE OF DISCHARGE:  01/09/2003                                 DISCHARGE SUMMARY   DISCHARGE DIAGNOSES:  1. Unstable angina.  2. Hypertension.  3. Diabetes mellitus.  4. Hyperlipidemia.   DISCHARGE MEDICATIONS:  1. Aspirin 325 mg p.o. q.d.  2. Plavix 75 mg p.o. q.d.  3. Lasix 20 mg p.o. q.d.  4. Isosorbide dinitrate 10 mg p.o. t.i.d.  5. Lipitor 40 mg p.o. q.d.  6. Metoprolol 75 mg p.o. b.i.d.  7. Lisinopril 10 mg p.o. q.d.  8. Glucophage 1000 mg p.o. b.i.d.  9. Nitroglycerin 0.4 mg sublingual q.56m. p.r.n. x 3 for chest pain.   DISPOSITION:  An appointment was made with [facility] for February 13, 2003 at 4:30 p.m. and follow-up appointment with myself, Dr. Tawny Asal, at [facility] on January 29, 2003 at 2:40.   PROCEDURE:  Cardiac catheterization was performed on January 08, 2003.  Angiographic data showed ventriculography in the RAO projection revealed  hypokinesis of anterolateral and inferior segment.  The overall systolic  function appeared to be reasonably well preserved.  The left main coronary  artery was free of critical disease.  The LAD has a 90% stenosis and then  there is competitive filling in the mid-vessel.  This circumflex has 90%  stenosis and is totally occluded.  The right coronary artery has multiple  lesions with 50%, then a 70%, and then 50-70% lesion just prior to the crux.  There is competitive filling of the PDA.  This fills a large posterolateral  system.  The internal mammary to the LAD is widely patent.  There is an area  of irregularity noted in the proximal to mid-graft of some nodular  irregularity but no focal stenosis.  The free RIMA that supplies the  diagonal appears to be intact.  The vein graft to the OM has widely patent  stent and a 30-40% narrowing distally.  The vein graft to the PDA is intact.  The PDA has distal stenosis.  There is retrograde filling of this into the  distal right circulation.   IMPRESSION:  Preserved overall left ventricular function.  Continued patency  of the internal mammary to left anterior descending.  Continued patency of  the right internal mammary artery to the diagonal.  Continued patency of the  vein graft to the obtuse marginal.  Continued patency to the posterior  descending artery with distal posterior descending artery stenosis and  retrograde filling on the posterolateral system.   CONSULTATIONS:  Cardiology was consulted on January 07, 2003.   CHIEF COMPLAINT:  Chest pain.   HISTORY OF PRESENT ILLNESS:  [name] is a 65 year old white male who has  diabetes mellitus, hypertension, dyslipidemia, obesity, and coronary artery  disease.  He presents for assessment of substernal chest discomfort.  The  patient has undergone coronary artery bypass graft in 1993 with redo in  1999.  He has had cardiac catheterization performed in 2202 and 2003 showing  native severe three vessel coronary artery disease and many patent bypass  grafts.  He has had mild to moderate LV dysfunction.  He had been doing well  until the past several months when he noted the increase in frequency and  severity of substernal chest discomfort.  He has run out of his Imdur and  subsequently stopped taking it.   In June, he was seen by Dr. Ronne Binning who advised hospital admission for his  crescendo symptoms and he refused.  He presents today with continued chest  discomfort and subsequently has been admitted.  He noted 10 out of 10 chest  pain at rest and with exertion.  It has been associated with shortness of  breath and occasional nausea.  He has also  noted some shortness of breath  and increased dyspnea on exertion.  He does admit to weight gain of 25  pounds in the last six months.  He has not had any syncope or presyncope.  He has noted some swelling in his legs.  He has some chronic back pain.  He  denies any claudication.  No constitutional symptoms.   REVIEW OF SYMPTOMS:  Otherwise unremarkable.   PAST MEDICAL HISTORY:  1. Obesity.  2. Diabetes mellitus.  3. Pulmonary hypertension.  4. Dyslipidemia.  5. Coronary artery disease.  6. Left carotid endarterectomy.   ALLERGIES:  LASIX, NORVASC, AVANDIA.   PHYSICAL EXAMINATION:  VITAL SIGNS:  Temperature 96.4, pulse 74, respiratory  rate 20, blood pressure 162/80.  Weight 272.  SAO2 96% on room air.  GENERAL:  He is obese white male in no acute distress.  HEENT:  Pupils are equal, round, and reactive to light and accommodation.  Extraocular movements intact.  Oropharynx shows no lesion.  NECK:  Supple.  There is soft left-sided bruit.  No adenopathy.  LUNGS:  Diminished breath sounds bilaterally.  Otherwise, clear.  CARDIOVASCULAR:  Regular rate with systolic ejection murmur.  ABDOMEN:  Protuberant and nontender.  EXTREMITIES:  There is 1+ pedal edema.  NEUROLOGICAL:  Motor strength is 5/5.  Sensory is intact to touch.  Cranial  nerves II through XII intact.   LABORATORY DATA:  ECG shows normal sinus rhythm at 78 with some old anterior  septal myocardial infarction but no acute ST changes.   Labs on admission show a CBC with a WBC of 8.9, RBC 4.46, hemoglobin 13.7,  hematocrit 39.8, MCV 89.1, MCHC 34.5.  RDW 14.3.  Platelet count 242,000.  Coagulase studies show PT 11.7, INR 0.8, PTT 31.  CMP shows a sodium of 135,  potassium 4.7, chloride 106, CO2 22, glucose 272, BUN 15, creatinine 1.1,  bilirubin 0.7, alkaline phosphate 111, SGOT 25, SGPT 20, total protein 6.9,  albumin 3.5, calcium 9.1.  Initial cardiac markers show a creatinine kinase of 276, CK-MB 9.7, relative index  3.5, troponin I 0.01.   IMPRESSION:  The patient was admitted for unstable angina.   HOSPITAL COURSE:  Problem 1:  UNSTABLE ANGINA:  The patient was admitted to  rule out MI.  His cardiac enzymes included a peak creatinine kinase of 276,  peak CK-MB of 8.0, peak relative index of 4, and peak troponin I of 0.02.  He was put on aspirin, Plavix, Zocor, and nitroglycerin and underwent  cardiac catheterization (the results of which have been discussed in the  procedure section of this discharge summary).   His chest pain continued to improve over the course of his stay and was  found to be very mild on the day of his discharge on January 09, 2003.  He was  discharged with cardiologist recommendation to continue his medical therapy.  The plan was to treat the patient as outpatient therapy to optimize blood  pressure, diabetes mellitus, lipids to further decrease cardiac risk factors  and to more aggressively treat his angina per cardiology recommendation.   Problem 2:  HYPERTENSION:  The patient was admitted with blood pressure of  162/89.  He was given his home regimen of Toprol, Accupril, Lasix, and  Imdur.   On January 09, 2003, his blood pressure range was systolic 120-155 over a  diastolic of 70-90.  Plan on discharge was to continue to titrate his blood  pressure medicines as tolerated to decrease further cardiac risk factors.   Problem 3:  DIABETES MELLITUS:  The patient was admitted with blood sugar of  272.  He was put on a home dose of Glucophage and insulin 70/30 sliding  scale.  On the day of discharge, his nonfasting blood sugar was found to be  182.  His plan on discharge was to optimize his blood sugar as an outpatient  to further decrease coronary artery risk factor.   Problem 4:  HYPERLIPIDEMIA:  Fasting lipid profile was performed on January 08, 2003.  Cholesterol was found to be 155, triglycerides 233, HDL 31, total  cholesterol/HDL ratio of 5.2, LDL 78.  BLDL of 47.   On  admission, he was placed on his home dose of Lipitor 20 mg p.o. q.d.  He  was discharged with Lipitor 40 mg p.o. q.d.   LABORATORY DATA:  CBC with a WBC of 8.6, RBC 3.9, hemoglobin 12.2,  hematocrit 35.5, MCV 88.9, MCHC 34.5, RDW 14.1, platelet count of 229,000.  BMP with sodium 136, potassium 3.9, chloride 105, CO2 25, glucose 118, BUN  13, creatinine 1.0.  Calcium 8.5.                                                Tawny Asal, MD    CW/MEDQ  D:  04/04/2003  T:  04/04/2003  Job:  [id number]

## 2011-03-16 LAB — CBC
Hemoglobin: 10.4 — ABNORMAL LOW
RDW: 16 — ABNORMAL HIGH

## 2011-03-16 LAB — POCT CARDIAC MARKERS
CKMB, poc: 4.6
Myoglobin, poc: 254
Operator id: 285841
Troponin i, poc: <0.05

## 2011-03-16 LAB — DIFFERENTIAL
Basophils Absolute: 0
Lymphocytes Relative: 35
Monocytes Absolute: 0.5
Neutro Abs: 4.6
Neutrophils Relative %: 54

## 2011-03-16 LAB — POCT I-STAT, CHEM 8
BUN: 23
Calcium, Ion: 1.23
Chloride: 104
Potassium: 3.9
Sodium: 142

## 2011-03-16 LAB — B-NATRIURETIC PEPTIDE (CONVERTED LAB): Pro B Natriuretic peptide (BNP): 403 — ABNORMAL HIGH

## 2011-03-30 DIAGNOSIS — Z9889 Other specified postprocedural states: Secondary | ICD-10-CM | POA: Insufficient documentation

## 2011-03-30 DIAGNOSIS — E669 Obesity, unspecified: Secondary | ICD-10-CM | POA: Insufficient documentation

## 2011-03-30 DIAGNOSIS — I259 Chronic ischemic heart disease, unspecified: Secondary | ICD-10-CM | POA: Insufficient documentation

## 2011-03-30 DIAGNOSIS — M8448XA Pathological fracture, other site, initial encounter for fracture: Secondary | ICD-10-CM | POA: Insufficient documentation

## 2011-03-30 DIAGNOSIS — M810 Age-related osteoporosis without current pathological fracture: Secondary | ICD-10-CM | POA: Insufficient documentation

## 2011-03-30 DIAGNOSIS — G4733 Obstructive sleep apnea (adult) (pediatric): Secondary | ICD-10-CM | POA: Insufficient documentation

## 2011-03-30 DIAGNOSIS — I10 Essential (primary) hypertension: Secondary | ICD-10-CM | POA: Insufficient documentation

## 2011-04-05 DIAGNOSIS — D509 Iron deficiency anemia, unspecified: Secondary | ICD-10-CM | POA: Insufficient documentation

## 2011-04-05 DIAGNOSIS — E539 Vitamin B deficiency, unspecified: Secondary | ICD-10-CM | POA: Insufficient documentation

## 2011-04-05 LAB — CBC
HCT: 32.6 — ABNORMAL LOW
MCHC: 33.6
MCV: 86.8
Platelets: 201
Platelets: 214
RBC: 3.73 — ABNORMAL LOW
RBC: 3.83 — ABNORMAL LOW
RDW: 15.3 — ABNORMAL HIGH
WBC: 10.4
WBC: 8.2

## 2011-04-05 LAB — COMPREHENSIVE METABOLIC PANEL
AST: 25
Albumin: 3.6
BUN: 24 — ABNORMAL HIGH
BUN: 25 — ABNORMAL HIGH
CO2: 24
Calcium: 8.9
Calcium: 9.1
Creatinine, Ser: 1.05
Creatinine, Ser: 1.29
GFR calc Af Amer: >60
GFR calc non Af Amer: 57 — ABNORMAL LOW
GFR calc non Af Amer: >60
Glucose, Bld: 286 — ABNORMAL HIGH
Sodium: 133 — ABNORMAL LOW
Total Protein: 6.6

## 2011-04-05 LAB — DIFFERENTIAL
Basophils Absolute: 0
Eosinophils Relative: 5
Lymphocytes Relative: 31
Lymphs Abs: 2.6
Monocytes Absolute: 0.4
Neutro Abs: 4.8

## 2011-04-05 LAB — APTT
aPTT: 27
aPTT: 28
aPTT: 31

## 2011-04-05 LAB — LIPID PANEL
Cholesterol: 201 — ABNORMAL HIGH
HDL: 24 — ABNORMAL LOW
LDL Cholesterol: 111 — ABNORMAL HIGH
LDL Cholesterol: 120 — ABNORMAL HIGH
Total CHOL/HDL Ratio: 8
Triglycerides: 287 — ABNORMAL HIGH
VLDL: 65 — ABNORMAL HIGH

## 2011-04-05 LAB — PROTIME-INR: Prothrombin Time: 13.4

## 2011-04-05 LAB — CK TOTAL AND CKMB (NOT AT ARMC)
CK, MB: 2.4
CK, MB: 5.3 — ABNORMAL HIGH
Relative Index: 2.6 — ABNORMAL HIGH
Total CK: 206

## 2011-04-05 LAB — BASIC METABOLIC PANEL
BUN: 19
BUN: 30 — ABNORMAL HIGH
CO2: 24
Chloride: 106
Chloride: 108
Creatinine, Ser: 0.98
GFR calc Af Amer: >60
GFR calc Af Amer: >60
GFR calc non Af Amer: 58 — ABNORMAL LOW
Potassium: 4.4
Sodium: 137

## 2011-04-05 LAB — CARDIAC PANEL(CRET KIN+CKTOT+MB+TROPI)
CK, MB: 5.1 — ABNORMAL HIGH
Relative Index: 2.7 — ABNORMAL HIGH
Total CK: 205
Troponin I: 0.03
Troponin I: 0.03

## 2011-04-05 LAB — TSH: TSH: 1.484

## 2011-04-05 LAB — TROPONIN I: Troponin I: 0.02

## 2011-06-08 DIAGNOSIS — E46 Unspecified protein-calorie malnutrition: Secondary | ICD-10-CM | POA: Insufficient documentation

## 2011-06-08 DIAGNOSIS — E119 Type 2 diabetes mellitus without complications: Secondary | ICD-10-CM | POA: Insufficient documentation

## 2011-06-29 DIAGNOSIS — Z91199 Patient's noncompliance with other medical treatment and regimen due to unspecified reason: Secondary | ICD-10-CM | POA: Insufficient documentation

## 2011-06-29 DIAGNOSIS — Z9119 Patient's noncompliance with other medical treatment and regimen: Secondary | ICD-10-CM | POA: Insufficient documentation

## 2011-08-18 DIAGNOSIS — S42253A Displaced fracture of greater tuberosity of unspecified humerus, initial encounter for closed fracture: Secondary | ICD-10-CM | POA: Insufficient documentation

## 2011-08-24 DIAGNOSIS — S42209A Unspecified fracture of upper end of unspecified humerus, initial encounter for closed fracture: Secondary | ICD-10-CM | POA: Insufficient documentation

## 2011-11-02 ENCOUNTER — Encounter (HOSPITAL_COMMUNITY): Payer: Self-pay

## 2011-11-02 ENCOUNTER — Emergency Department (HOSPITAL_COMMUNITY)
Admission: EM | Admit: 2011-11-02 | Discharge: 2011-11-02 | Disposition: A | Discharge status: Home / Self Care | Payer: Medicare Other | Attending: Emergency Medicine | Admitting: Emergency Medicine

## 2011-11-02 DIAGNOSIS — R55 Syncope and collapse: Secondary | ICD-10-CM | POA: Insufficient documentation

## 2011-11-02 DIAGNOSIS — R42 Dizziness and giddiness: Secondary | ICD-10-CM | POA: Insufficient documentation

## 2011-11-02 DIAGNOSIS — E119 Type 2 diabetes mellitus without complications: Secondary | ICD-10-CM | POA: Insufficient documentation

## 2011-11-02 DIAGNOSIS — Z794 Long term (current) use of insulin: Secondary | ICD-10-CM | POA: Insufficient documentation

## 2011-11-02 DIAGNOSIS — E7800 Pure hypercholesterolemia, unspecified: Secondary | ICD-10-CM | POA: Insufficient documentation

## 2011-11-02 DIAGNOSIS — I2510 Atherosclerotic heart disease of native coronary artery without angina pectoris: Secondary | ICD-10-CM | POA: Insufficient documentation

## 2011-11-02 DIAGNOSIS — Z7982 Long term (current) use of aspirin: Secondary | ICD-10-CM | POA: Insufficient documentation

## 2011-11-02 DIAGNOSIS — Z79899 Other long term (current) drug therapy: Secondary | ICD-10-CM | POA: Insufficient documentation

## 2011-11-02 HISTORY — DX: Pure hypercholesterolemia, unspecified: E78.00

## 2011-11-02 HISTORY — DX: Atherosclerotic heart disease of native coronary artery without angina pectoris: I25.10

## 2011-11-02 LAB — URINALYSIS, DIPSTICK ONLY
Bilirubin Urine: NEGATIVE
Ketones, ur: NEGATIVE mg/dL
Nitrite: NEGATIVE
Protein, ur: NEGATIVE mg/dL
Specific Gravity, Urine: 1.01 (ref 1.005–1.030)
Urobilinogen, UA: 0.2 mg/dL (ref 0.0–1.0)

## 2011-11-02 LAB — CBC
HCT: 30.5 % — ABNORMAL LOW (ref 39.0–52.0)
MCH: 32.3 pg (ref 26.0–34.0)
MCV: 100.7 fL — ABNORMAL HIGH (ref 78.0–100.0)
Platelets: 204 10*3/uL (ref 150–400)
RBC: 3.03 MIL/uL — ABNORMAL LOW (ref 4.22–5.81)

## 2011-11-02 LAB — BASIC METABOLIC PANEL
BUN: 28 mg/dL — ABNORMAL HIGH (ref 6–23)
CO2: 28 mEq/L (ref 19–32)
Calcium: 8.8 mg/dL (ref 8.4–10.5)
Creatinine, Ser: 1.58 mg/dL — ABNORMAL HIGH (ref 0.50–1.35)
Glucose, Bld: 179 mg/dL — ABNORMAL HIGH (ref 70–99)

## 2011-11-02 LAB — GLUCOSE, CAPILLARY: Glucose-Capillary: 170 mg/dL — ABNORMAL HIGH (ref 70–99)

## 2011-11-02 MED ORDER — SODIUM CHLORIDE 0.9 % IV BOLUS (SEPSIS)
1000.0000 mL | Freq: Once | INTRAVENOUS | Status: AC
  Administered 2011-11-02: 1000 mL via INTRAVENOUS

## 2011-11-02 MED ORDER — OXYCODONE-ACETAMINOPHEN 5-325 MG PO TABS
1.0000 | ORAL_TABLET | Freq: Once | ORAL | Status: AC
  Administered 2011-11-02: 1 via ORAL
  Filled 2011-11-02: qty 1

## 2011-11-02 NOTE — Discharge Instructions (Signed)

## 2011-11-02 NOTE — ED Provider Notes (Signed)
I saw and evaluated the patient, reviewed the resident's note and I agree with the findings and plan. Hx of cad and pud.  Had to have transfusion due to pud in past.  Pt c/o light headedness earlier today.  Denies cp, sob, cough, n/v/diarrhea. Denies palpitations.  No recent illness.  Had right shoulder surg recently.  Takes narc for pain, but not before sxs.  asx now/  {E carotid bruit and heart murmur o/w neg.    Cheri Guppy, MD 11/02/11 [phone]

## 2011-11-02 NOTE — ED Provider Notes (Signed)
History     CSN: [id number]  Arrival date & time 11/02/11  1336   First MD Initiated Contact with Patient 11/02/11 [phone]      Chief Complaint  Patient presents with  . Near Syncope    (Consider location/radiation/quality/duration/timing/severity/associated sxs/prior treatment) HPI Comments: Sitting down to eat and felt lightheaded like he was going to blackout.  No LOC.  No chest pain, dyspnea, palpitations or other symptoms.  Starting to feel better in ED.  Otherwise doing well.  Blood sugars normal this am and good PO intake today.  No diarrhea, vomiting, or blood in stool.  Patient is a 66 y.o. male presenting with general illness. The history is provided by the patient.  Illness  The current episode started today. The onset was sudden. The problem occurs occasionally. The problem is moderate. The symptoms are relieved by nothing. The symptoms are aggravated by nothing. Pertinent negatives include no fever, no abdominal pain, no diarrhea, no vomiting, no congestion, no headaches, no stridor, no wheezing, no rash and no eye pain. He has been behaving normally.    Past Medical History  Diagnosis Date  . Coronary artery disease   . Diabetes mellitus   . Hypercholesteremia     Past Surgical History  Procedure Date  . Coronary artery bypass graft   . Carotid endarterectomy     History reviewed. No pertinent family history.  History  Substance Use Topics  . Smoking status: Never Smoker   . Smokeless tobacco: Not on file  . Alcohol Use: No      Review of Systems  Constitutional: Negative for fever, activity change and fatigue.  HENT: Negative for congestion.   Eyes: Negative for pain.  Respiratory: Negative for chest tightness, shortness of breath, wheezing and stridor.   Cardiovascular: Negative for chest pain and leg swelling.  Gastrointestinal: Negative for vomiting, abdominal pain and diarrhea.  Genitourinary: Negative for dysuria.  Musculoskeletal: Negative for  arthralgias.  Skin: Negative for rash.  Neurological: Negative for headaches.  Psychiatric/Behavioral: Negative for behavioral problems.    Allergies  Morphine and related  Home Medications   Current Outpatient Rx  Name Route Sig Dispense Refill  . ASPIRIN 81 MG PO TABS Oral Take 81 mg by mouth daily.    Marland Kitchen CARVEDILOL 12.5 MG PO TABS Oral Take 12.5 mg by mouth 2 (two) times daily with a meal.    . VITAMIN D PO Oral Take 1 tablet by mouth daily.    Marland Kitchen VITAMIN B 12 PO Oral Take 1 tablet by mouth daily.    . FUROSEMIDE 20 MG PO TABS Oral Take 20 mg by mouth 3 (three) times daily.    . INSULIN GLARGINE 100 UNIT/ML [place] SOLN Subcutaneous Inject 25 Units into the skin at bedtime.    . INSULIN LISPRO (HUMAN) 100 UNIT/ML [place] SOLN Subcutaneous Inject 3-10 Units into the skin 3 (three) times daily before meals. Based on sliding scale    . ADULT MULTIVITAMIN W/MINERALS CH Oral Take 1 tablet by mouth daily.    . OXYCODONE-ACETAMINOPHEN 10-325 MG PO TABS Oral Take 1 tablet by mouth every 12 (twelve) hours as needed. For pain    . RANOLAZINE ER 500 MG PO TB12 Oral Take 1,000 mg by mouth 2 (two) times daily.    Marland Kitchen SIMVASTATIN 20 MG PO TABS Oral Take 20 mg by mouth every evening.      BP 106/49  Pulse 78  Temp(Src) 98.4 F (36.9 C) (Oral)  Resp 17  Ht  5\' 7"  (1.702 m)  Wt 200 lb (90.719 kg)  BMI 31.32 kg/m2  SpO2 99%  Physical Exam  Constitutional: He is oriented to person, place, and time. He appears well-developed and well-nourished. No distress.  HENT:  Head: Normocephalic and atraumatic.  Eyes: Conjunctivae and EOM are normal. Pupils are equal, round, and reactive to light. No scleral icterus.  Neck: Normal range of motion. Neck supple.  Cardiovascular: Normal rate and regular rhythm.  Exam reveals no gallop and no friction rub.   No murmur heard. Pulmonary/Chest: Effort normal and breath sounds normal. No respiratory distress. He has no wheezes. He has no rales. He exhibits no tenderness.    Abdominal: Soft. He exhibits no distension and no mass. There is no tenderness. There is no rebound and no guarding.  Musculoskeletal: Normal range of motion. He exhibits no edema and no tenderness.  Neurological: He is alert and oriented to person, place, and time. He has normal reflexes. No cranial nerve deficit. He exhibits normal muscle tone. Coordination normal.  Skin: Skin is warm and dry. No rash noted. He is not diaphoretic. No erythema.  Psychiatric: He has a normal mood and affect. His behavior is normal. Judgment and thought content normal.    ED Course  Procedures (including critical care time)  Labs Reviewed  GLUCOSE, CAPILLARY - Abnormal; Notable for the following:    Glucose-Capillary 170 (*)    All other components within normal limits  CBC - Abnormal; Notable for the following:    RBC 3.03 (*)    Hemoglobin 9.8 (*)    HCT 30.5 (*)    MCV 100.7 (*)    RDW 16.1 (*)    All other components within normal limits  BASIC METABOLIC PANEL - Abnormal; Notable for the following:    Glucose, Bld 179 (*)    BUN 28 (*)    Creatinine, Ser 1.58 (*)    GFR calc non Af Amer 45 (*)    GFR calc Af Amer 52 (*)    All other components within normal limits  GLUCOSE, CAPILLARY - Abnormal; Notable for the following:    Glucose-Capillary 123 (*)    All other components within normal limits  URINALYSIS, DIPSTICK ONLY   No results found.  EKG: NSR, no ST/T wave abnormality.  No change from prior.  1. Lightheadedness       MDM  Sitting down to eat and felt lightheaded like he was going to blackout.  No LOC.  No chest pain, dyspnea, palpitations or other symptoms.  Starting to feel better in ED.  Otherwise doing well.  Blood sugars normal this am and good PO intake today.  Slight hypotension in ED but VSS.  Well appearing.  Given fluids in ED with resolution of symptoms.  No prodrome suggestive of cardiac arrythmia or MI. Labs unconcerning.  Hb 10 but pt says he has hx of anemia and  no current evidence of bleeding.  Likely slight dehydration.  Will have pt discuss his coreg/lasix doses with PCP this week. Pt comfortable with plan and will follow up.   [place]y [place], MD 11/02/11 [phone]

## 2011-11-02 NOTE — ED Notes (Signed)
[id number] 

## 2011-11-02 NOTE — ED Notes (Signed)
Pt reports sitting in restaurant eating, states was dizzy when woke, at restaurant was feeling faint,

## 2011-11-09 DIAGNOSIS — M109 Gout, unspecified: Secondary | ICD-10-CM | POA: Insufficient documentation

## 2011-11-17 NOTE — ED Provider Notes (Signed)
I saw and evaluated the patient, reviewed the resident's note and I agree with the findings and plan.  Cheri Guppy, MD 11/17/11 [phone]

## 2012-01-17 DIAGNOSIS — I509 Heart failure, unspecified: Secondary | ICD-10-CM | POA: Insufficient documentation

## 2012-02-28 DIAGNOSIS — R5383 Other fatigue: Secondary | ICD-10-CM | POA: Insufficient documentation

## 2012-02-28 DIAGNOSIS — K2990 Gastroduodenitis, unspecified, without bleeding: Secondary | ICD-10-CM

## 2012-02-28 DIAGNOSIS — Z8619 Personal history of other infectious and parasitic diseases: Secondary | ICD-10-CM | POA: Insufficient documentation

## 2012-02-28 DIAGNOSIS — R5381 Other malaise: Secondary | ICD-10-CM

## 2012-02-28 DIAGNOSIS — K2970 Gastritis, unspecified, without bleeding: Secondary | ICD-10-CM | POA: Insufficient documentation

## 2012-04-18 DIAGNOSIS — K922 Gastrointestinal hemorrhage, unspecified: Secondary | ICD-10-CM | POA: Insufficient documentation

## 2012-04-18 DIAGNOSIS — R110 Nausea: Secondary | ICD-10-CM | POA: Insufficient documentation

## 2012-04-18 DIAGNOSIS — R109 Unspecified abdominal pain: Secondary | ICD-10-CM | POA: Insufficient documentation

## 2012-04-24 DIAGNOSIS — I5031 Acute diastolic (congestive) heart failure: Secondary | ICD-10-CM | POA: Insufficient documentation

## 2012-08-31 NOTE — Progress Notes (Signed)
67 yo male presented to [facility] ED with sob, le edema h/o cad/cabg last EF 40%, CKD cr now 2.5 (baseline around 1.9) has rt pleural effusion, no fever, wbc normal.  bnp over 15000 hgb is 7 and is heme positive but no gross bleeding (h/o gib in past).  vss oxygen sats 97% on 3 L (no home o2 requirements), drops to around 90% on RA, bp normal.  [facility] hospitalist and cardiologist requesting transfer due to lack of nephrologist.  EDP is transfusing 2 units of PRBC now, advised EDP to give lasix 60mg  iv once.  (pt on toresemide 60mg  po bid).  Accepted to tele bed to [facility] team 10.

## 2012-09-01 ENCOUNTER — Encounter (HOSPITAL_COMMUNITY): Payer: Self-pay

## 2012-09-01 ENCOUNTER — Inpatient Hospital Stay (HOSPITAL_COMMUNITY): Payer: PRIVATE HEALTH INSURANCE

## 2012-09-01 ENCOUNTER — Inpatient Hospital Stay (HOSPITAL_COMMUNITY)
Admission: EM | Admit: 2012-09-01 | Discharge: 2012-09-10 | DRG: 286 | Disposition: A | Discharge status: Home / Self Care | Payer: PRIVATE HEALTH INSURANCE | Source: Other Acute Inpatient Hospital | Attending: Internal Medicine | Admitting: Internal Medicine

## 2012-09-01 DIAGNOSIS — I2510 Atherosclerotic heart disease of native coronary artery without angina pectoris: Secondary | ICD-10-CM

## 2012-09-01 DIAGNOSIS — IMO0002 Reserved for concepts with insufficient information to code with codable children: Secondary | ICD-10-CM

## 2012-09-01 DIAGNOSIS — M545 Other chronic pain: Secondary | ICD-10-CM | POA: Diagnosis present

## 2012-09-01 DIAGNOSIS — I369 Nonrheumatic tricuspid valve disorder, unspecified: Secondary | ICD-10-CM

## 2012-09-01 DIAGNOSIS — G8929 Other chronic pain: Secondary | ICD-10-CM | POA: Diagnosis present

## 2012-09-01 DIAGNOSIS — K5900 Constipation, unspecified: Secondary | ICD-10-CM | POA: Diagnosis not present

## 2012-09-01 DIAGNOSIS — J90 Pleural effusion, not elsewhere classified: Secondary | ICD-10-CM

## 2012-09-01 DIAGNOSIS — I509 Heart failure, unspecified: Secondary | ICD-10-CM | POA: Diagnosis present

## 2012-09-01 DIAGNOSIS — IMO0001 Reserved for inherently not codable concepts without codable children: Secondary | ICD-10-CM | POA: Diagnosis present

## 2012-09-01 DIAGNOSIS — I5043 Acute on chronic combined systolic (congestive) and diastolic (congestive) heart failure: Secondary | ICD-10-CM | POA: Diagnosis present

## 2012-09-01 DIAGNOSIS — I10 Essential (primary) hypertension: Secondary | ICD-10-CM | POA: Diagnosis present

## 2012-09-01 DIAGNOSIS — Z7982 Long term (current) use of aspirin: Secondary | ICD-10-CM

## 2012-09-01 DIAGNOSIS — E785 Hyperlipidemia, unspecified: Secondary | ICD-10-CM

## 2012-09-01 DIAGNOSIS — I5023 Acute on chronic systolic (congestive) heart failure: Secondary | ICD-10-CM

## 2012-09-01 DIAGNOSIS — Z79899 Other long term (current) drug therapy: Secondary | ICD-10-CM

## 2012-09-01 DIAGNOSIS — N179 Acute kidney failure, unspecified: Secondary | ICD-10-CM | POA: Diagnosis present

## 2012-09-01 DIAGNOSIS — K31819 Angiodysplasia of stomach and duodenum without bleeding: Secondary | ICD-10-CM

## 2012-09-01 DIAGNOSIS — R195 Other fecal abnormalities: Secondary | ICD-10-CM

## 2012-09-01 DIAGNOSIS — K766 Portal hypertension: Secondary | ICD-10-CM | POA: Diagnosis present

## 2012-09-01 DIAGNOSIS — K7460 Unspecified cirrhosis of liver: Secondary | ICD-10-CM

## 2012-09-01 DIAGNOSIS — E1165 Type 2 diabetes mellitus with hyperglycemia: Secondary | ICD-10-CM | POA: Diagnosis present

## 2012-09-01 DIAGNOSIS — N189 Chronic kidney disease, unspecified: Secondary | ICD-10-CM

## 2012-09-01 DIAGNOSIS — R188 Other ascites: Secondary | ICD-10-CM | POA: Diagnosis present

## 2012-09-01 DIAGNOSIS — K219 Gastro-esophageal reflux disease without esophagitis: Secondary | ICD-10-CM | POA: Diagnosis present

## 2012-09-01 DIAGNOSIS — I130 Hypertensive heart and chronic kidney disease with heart failure and stage 1 through stage 4 chronic kidney disease, or unspecified chronic kidney disease: Principal | ICD-10-CM | POA: Diagnosis present

## 2012-09-01 DIAGNOSIS — D649 Anemia, unspecified: Secondary | ICD-10-CM

## 2012-09-01 DIAGNOSIS — Z951 Presence of aortocoronary bypass graft: Secondary | ICD-10-CM

## 2012-09-01 DIAGNOSIS — Z794 Long term (current) use of insulin: Secondary | ICD-10-CM

## 2012-09-01 HISTORY — DX: Heart failure, unspecified: I50.9

## 2012-09-01 HISTORY — DX: Shortness of breath: R06.02

## 2012-09-01 HISTORY — DX: Gastro-esophageal reflux disease without esophagitis: K21.9

## 2012-09-01 HISTORY — DX: Essential (primary) hypertension: I10

## 2012-09-01 LAB — CBC
HCT: 29.6 % — ABNORMAL LOW (ref 39.0–52.0)
MCH: 28.3 pg (ref 26.0–34.0)
MCV: 87.3 fL (ref 78.0–100.0)
Platelets: 236 10*3/uL (ref 150–400)
RDW: 16.3 % — ABNORMAL HIGH (ref 11.5–15.5)

## 2012-09-01 LAB — COMPREHENSIVE METABOLIC PANEL
AST: 23 U/L (ref 0–37)
Albumin: 2.6 g/dL — ABNORMAL LOW (ref 3.5–5.2)
BUN: 54 mg/dL — ABNORMAL HIGH (ref 6–23)
Calcium: 8.8 mg/dL (ref 8.4–10.5)
Creatinine, Ser: 2.38 mg/dL — ABNORMAL HIGH (ref 0.50–1.35)
GFR calc non Af Amer: 27 mL/min — ABNORMAL LOW (ref >90)

## 2012-09-01 LAB — TROPONIN I: Troponin I: <0.3 ng/mL (ref <0.30)

## 2012-09-01 LAB — GLUCOSE, CAPILLARY
Glucose-Capillary: 151 mg/dL — ABNORMAL HIGH (ref 70–99)
Glucose-Capillary: 231 mg/dL — ABNORMAL HIGH (ref 70–99)

## 2012-09-01 LAB — PRO B NATRIURETIC PEPTIDE: Pro B Natriuretic peptide (BNP): 10584 pg/mL — ABNORMAL HIGH (ref 0–125)

## 2012-09-01 LAB — HEMOGLOBIN A1C: Mean Plasma Glucose: 143 mg/dL — ABNORMAL HIGH (ref <117)

## 2012-09-01 LAB — TSH: TSH: 1.237 u[IU]/mL (ref 0.350–4.500)

## 2012-09-01 MED ORDER — FUROSEMIDE 10 MG/ML IJ SOLN
40.0000 mg | Freq: Three times a day (TID) | INTRAMUSCULAR | Status: DC
  Administered 2012-09-02 – 2012-09-03 (×5): 40 mg via INTRAVENOUS
  Filled 2012-09-01 (×9): qty 4

## 2012-09-01 MED ORDER — ISOSORBIDE MONONITRATE ER 30 MG PO TB24
30.0000 mg | ORAL_TABLET | Freq: Every day | ORAL | Status: DC
  Administered 2012-09-01 – 2012-09-05 (×5): 30 mg via ORAL
  Filled 2012-09-01 (×5): qty 1

## 2012-09-01 MED ORDER — SODIUM CHLORIDE 0.9 % IJ SOLN
3.0000 mL | Freq: Two times a day (BID) | INTRAMUSCULAR | Status: DC
  Administered 2012-09-01 – 2012-09-09 (×9): 3 mL via INTRAVENOUS

## 2012-09-01 MED ORDER — FUROSEMIDE 10 MG/ML IJ SOLN
40.0000 mg | Freq: Two times a day (BID) | INTRAMUSCULAR | Status: DC
  Administered 2012-09-01 (×2): 40 mg via INTRAVENOUS
  Filled 2012-09-01 (×2): qty 4

## 2012-09-01 MED ORDER — SIMVASTATIN 20 MG PO TABS
20.0000 mg | ORAL_TABLET | Freq: Every evening | ORAL | Status: DC
  Administered 2012-09-01 – 2012-09-09 (×9): 20 mg via ORAL
  Filled 2012-09-01 (×11): qty 1

## 2012-09-01 MED ORDER — POLYETHYLENE GLYCOL 3350 17 G PO PACK
17.0000 g | PACK | Freq: Every day | ORAL | Status: DC | PRN
  Administered 2012-09-05: 17 g via ORAL
  Filled 2012-09-01: qty 1

## 2012-09-01 MED ORDER — CARVEDILOL 12.5 MG PO TABS
12.5000 mg | ORAL_TABLET | Freq: Two times a day (BID) | ORAL | Status: DC
  Administered 2012-09-01 – 2012-09-04 (×7): 12.5 mg via ORAL
  Filled 2012-09-01 (×9): qty 1

## 2012-09-01 MED ORDER — INSULIN GLARGINE 100 UNIT/ML ~~LOC~~ SOLN
15.0000 [IU] | Freq: Every day | SUBCUTANEOUS | Status: DC
  Administered 2012-09-01 – 2012-09-03 (×3): 15 [IU] via SUBCUTANEOUS
  Administered 2012-09-05: 4 [IU] via SUBCUTANEOUS
  Administered 2012-09-06: 12 [IU] via SUBCUTANEOUS
  Administered 2012-09-07 – 2012-09-09 (×3): 15 [IU] via SUBCUTANEOUS
  Filled 2012-09-01 (×7): qty 0.15

## 2012-09-01 MED ORDER — OXYCODONE HCL 5 MG PO TABS
5.0000 mg | ORAL_TABLET | ORAL | Status: DC | PRN
  Administered 2012-09-01 – 2012-09-10 (×27): 5 mg via ORAL
  Filled 2012-09-01 (×27): qty 1
  Filled 2012-09-01: qty 2

## 2012-09-01 MED ORDER — ONDANSETRON HCL 4 MG/2ML IJ SOLN
4.0000 mg | Freq: Four times a day (QID) | INTRAMUSCULAR | Status: DC | PRN
  Administered 2012-09-04 – 2012-09-06 (×4): 4 mg via INTRAVENOUS
  Filled 2012-09-01 (×4): qty 2

## 2012-09-01 MED ORDER — ONDANSETRON HCL 4 MG PO TABS: 4.0000 mg | ORAL_TABLET | Freq: Four times a day (QID) | ORAL | Status: DC | PRN

## 2012-09-01 MED ORDER — ALBUTEROL SULFATE (5 MG/ML) 0.5% IN NEBU: 2.5000 mg | INHALATION_SOLUTION | RESPIRATORY_TRACT | Status: DC | PRN

## 2012-09-01 MED ORDER — POTASSIUM CHLORIDE CRYS ER 20 MEQ PO TBCR
40.0000 meq | EXTENDED_RELEASE_TABLET | Freq: Once | ORAL | Status: AC
  Administered 2012-09-01: 40 meq via ORAL
  Filled 2012-09-01: qty 2

## 2012-09-01 MED ORDER — INSULIN ASPART 100 UNIT/ML ~~LOC~~ SOLN
0.0000 [IU] | Freq: Three times a day (TID) | SUBCUTANEOUS | Status: DC
  Administered 2012-09-01: 3 [IU] via SUBCUTANEOUS
  Administered 2012-09-01: 2 [IU] via SUBCUTANEOUS
  Administered 2012-09-01: 1 [IU] via SUBCUTANEOUS
  Administered 2012-09-02 (×2): 2 [IU] via SUBCUTANEOUS
  Administered 2012-09-02: 5 [IU] via SUBCUTANEOUS
  Administered 2012-09-03 – 2012-09-05 (×5): 2 [IU] via SUBCUTANEOUS
  Administered 2012-09-05: 1 [IU] via SUBCUTANEOUS
  Administered 2012-09-05 – 2012-09-07 (×3): 2 [IU] via SUBCUTANEOUS
  Administered 2012-09-07 – 2012-09-09 (×3): 3 [IU] via SUBCUTANEOUS
  Administered 2012-09-09: 2 [IU] via SUBCUTANEOUS
  Administered 2012-09-09 – 2012-09-10 (×2): 1 [IU] via SUBCUTANEOUS

## 2012-09-01 MED ORDER — ASPIRIN 81 MG PO CHEW
81.0000 mg | CHEWABLE_TABLET | Freq: Every day | ORAL | Status: DC
  Administered 2012-09-01 – 2012-09-10 (×8): 81 mg via ORAL
  Filled 2012-09-01 (×7): qty 1

## 2012-09-01 MED ORDER — SENNA 8.6 MG PO TABS
1.0000 | ORAL_TABLET | Freq: Two times a day (BID) | ORAL | Status: DC
  Administered 2012-09-01 – 2012-09-09 (×14): 8.6 mg via ORAL
  Filled 2012-09-01 (×20): qty 1

## 2012-09-01 MED ORDER — RANOLAZINE ER 500 MG PO TB12
1000.0000 mg | ORAL_TABLET | Freq: Two times a day (BID) | ORAL | Status: DC
  Administered 2012-09-01 – 2012-09-05 (×9): 1000 mg via ORAL
  Filled 2012-09-01 (×11): qty 2

## 2012-09-01 MED ORDER — INSULIN ASPART 100 UNIT/ML ~~LOC~~ SOLN
0.0000 [IU] | Freq: Every day | SUBCUTANEOUS | Status: DC
  Administered 2012-09-01: 2 [IU] via SUBCUTANEOUS
  Administered 2012-09-08: 21:00:00 via SUBCUTANEOUS

## 2012-09-01 MED ORDER — ADULT MULTIVITAMIN W/MINERALS CH
1.0000 | ORAL_TABLET | Freq: Every day | ORAL | Status: DC
  Administered 2012-09-01 – 2012-09-10 (×8): 1 via ORAL
  Filled 2012-09-01 (×10): qty 1

## 2012-09-01 MED ORDER — HYDRALAZINE HCL 25 MG PO TABS
12.5000 mg | ORAL_TABLET | Freq: Three times a day (TID) | ORAL | Status: DC
  Administered 2012-09-01 (×2): 12.5 mg via ORAL
  Administered 2012-09-02: 23:00:00 via ORAL
  Administered 2012-09-02: 12.5 mg via ORAL
  Administered 2012-09-03: 22:00:00 via ORAL
  Administered 2012-09-03 – 2012-09-04 (×3): 12.5 mg via ORAL
  Administered 2012-09-04: 06:00:00 via ORAL
  Administered 2012-09-05: 12.5 mg via ORAL
  Filled 2012-09-01 (×15): qty 0.5

## 2012-09-01 NOTE — H&P (Signed)
[facility] History and Physical  [name] ZOX:[id number] DOB: August 11, 1945 DOA: 09/01/2012  Referring physician: EDP at [facility] PCP: Dina Rich, MD  OP Specialists:  1. Cardiologist: Dr. Norman Herrlich  Chief Complaint: Worsening dyspnea and leg swelling  HPI: [name] is a 67 y.o. male with history of type II DM, HTN, hyperlipidemia, CAD status post CABG and stents, chronic kidney disease with baseline creatinine 1.9, chronic systolic CHF (last EF said to be 40%) and GERD was transferred from [facility] ED to [facility] for further evaluation and management of worsening dyspnea and leg swelling. Patient gives a 2 week history of gradually worsening bilateral lower extremity swelling, abdominal distention and DOE. He saw his primary cardiologist last week and was placed on high dose Lasix for a few days. Patient's symptoms initially improved but in the last 3 or 4 days, he is noted worsening DOE when he walks a few steps. He denies dyspnea at rest, chest pain, palpitations. He normally uses 2 pillows which has not changed. He has noticed worsening leg swelling. He presented to the [facility] ED where hemoglobin was 7 g/dL (patient states that recently his hemoglobin has been 9.1 and 8.4 g/dL) and creatinine of 2.5. His case was discussed with the hospitalist and cardiologist there who advised transfer to tertiary hospital. Patient denies any bleeding, vomiting or melena. Patient was transfused 1 unit of PRBCs on route to [facility]. His O2 sats at [facility] was 90% on room air.   Review of Systems:  All systems reviewed and apart from history of presenting illness, pertinent for chronic low back pain.  Past Medical History  Diagnosis Date  . Coronary artery disease   . Diabetes mellitus   . Hypercholesteremia   . Shortness of breath   . CHF (congestive heart failure)   . HTN (hypertension)   . GERD (gastroesophageal reflux disease)    Past  Surgical History  Procedure Laterality Date  . Coronary artery bypass graft    . Carotid endarterectomy    . Cardiac catheterization    . Shoulder surgery     Social History:  reports that he has never smoked. He does not have any smokeless tobacco history on file. He reports that he does not drink alcohol or use illicit drugs. Patient is married. Lives with his wife and is independent of activities of daily living.  Allergies  Allergen Reactions  . Morphine And Related Other (See Comments)    shakes    Family History  Problem Relation Age of Onset  . Heart disease Father     Prior to Admission medications   Medication Sig Start Date End Date Taking? Authorizing Provider  aspirin 81 MG tablet Take 81 mg by mouth daily.   Yes Historical Provider, MD  carvedilol (COREG) 12.5 MG tablet Take 12.5 mg by mouth 2 (two) times daily with a meal.   Yes Historical Provider, MD  Cholecalciferol (VITAMIN D PO) Take 1 tablet by mouth daily.   Yes Historical Provider, MD  Cyanocobalamin (VITAMIN B 12 PO) Take 1 tablet by mouth daily.   Yes Historical Provider, MD  furosemide (LASIX) 20 MG tablet Take 20 mg by mouth 3 (three) times daily.   Yes Historical Provider, MD  insulin glargine (LANTUS) 100 UNIT/ML injection Inject 25 Units into the skin at bedtime.   Yes Historical Provider, MD  insulin lispro (HUMALOG) 100 UNIT/ML injection Inject 3-10 Units into the skin 3 (three) times daily before meals.  Based on sliding scale   Yes Historical Provider, MD  Multiple Vitamin (MULITIVITAMIN WITH MINERALS) TABS Take 1 tablet by mouth daily.   Yes Historical Provider, MD  oxyCODONE-acetaminophen (PERCOCET) 10-325 MG per tablet Take 1 tablet by mouth every 12 (twelve) hours as needed. For pain   Yes Historical Provider, MD  ranolazine (RANEXA) 500 MG 12 hr tablet Take 1,000 mg by mouth 2 (two) times daily.   Yes Historical Provider, MD  simvastatin (ZOCOR) 20 MG tablet Take 20 mg by mouth every evening.    Yes Historical Provider, MD   Physical Exam: Filed Vitals:   09/01/12 0206  BP: 129/74  Pulse: 46  Temp: 98.5 F (36.9 C)  TempSrc: Oral  Resp: 18  Height: 5\' 7"  (1.702 m)  Weight: 81.9 kg (180 lb 8.9 oz)  SpO2: 97%     General exam: Moderately built and nourished male patient, lying comfortably supine in bed in no obvious distress.  Head, eyes and ENT: Nontraumatic and normocephalic. Pupils equally reacting to light and accommodation. Oral mucosa moist. Edentulous.  Neck: Supple. No JVD, carotid bruit or thyromegaly.  Lymphatics: No lymphadenopathy.  Respiratory system: Reduced breath sounds right base greater than left base with associated left basal crackles. Rest of lung fields are clear without wheezing or rhonchi. No increased work of breathing. Able to speak in full sentences  Cardiovascular system: S1 and S2 heard, RRR. No JVD, murmurs, gallops, clicks. 1+ bilateral leg edema and 2+ sacral edema.  Gastrointestinal system: Abdomen is obese/? Mildly distended, soft and nontender. Normal bowel sounds heard. No organomegaly or masses appreciated.  Central nervous system: Alert and oriented. No focal neurological deficits.  Extremities: Symmetric 5 x 5 power. Peripheral pulses symmetrically felt.  Skin: No rashes or acute findings.  Musculoskeletal system: Negative exam.  Psychiatry: Pleasant and cooperative.   Labs on Admission:  Basic Metabolic Panel:  Recent Labs Lab 09/01/12 0430  NA 134*  K 3.3*  CL 93*  CO2 30  GLUCOSE 167*  BUN 54*  CREATININE 2.38*  CALCIUM 8.8   Liver Function Tests:  Recent Labs Lab 09/01/12 0430  AST 23  ALT 14  ALKPHOS 131*  BILITOT 1.4*  PROT 6.5  ALBUMIN 2.6*   No results found for this basename: LIPASE, AMYLASE,  in the last 168 hours No results found for this basename: AMMONIA,  in the last 168 hours CBC:  Recent Labs Lab 09/01/12 0430  WBC 8.4  HGB 9.6*  HCT 29.6*  MCV 87.3  PLT 236   Cardiac  Enzymes:  Recent Labs Lab 09/01/12 0430  TROPONINI <0.30    BNP (last 3 results)  Recent Labs  09/01/12 0430  PROBNP 10584.0*   CBG: No results found for this basename: GLUCAP,  in the last 168 hours  Radiological Exams on Admission: Dg Chest Port 1 View  09/01/2012  *RADIOLOGY REPORT*  Clinical Data: CHF, cough, shortness of breath.  PORTABLE CHEST - 1 VIEW  Comparison: 10/12/2007  Findings: Prominent cardiomediastinal contours.  Central vascular congestion.  Moderate right pleural effusion and associated airspace opacity.  Mild left lung base opacity.  Degraded by rotation.  Status post median sternotomy and CABG.  Evidence of prior vertebroplasties.  No definite pneumothorax.  IMPRESSION: Right pleural effusion and associated airspace opacity; atelectasis versus pneumonia.  Mild left lung base opacity, favor atelectasis.  Prominent cardiomediastinal contours status post median sternotomy and CABG.   Original Report Authenticated By: Jearld Lesch, M.D.     EKG:  Independently reviewed. Done at [facility] ED-sinus rhythm at 77 beats per minute, occasional PVCs, Q waves in inferior lateral leads.  Assessment/Plan Active Problems:   Anemia   Renal failure (ARF), acute on chronic   Pleural effusion, right   Diabetes type 2, uncontrolled   HTN (hypertension)   Hyperlipidemia   CAD (coronary artery disease), S/P CABG & stents   Systolic CHF, acute on chronic   Chronic LBP   1. Acute on chronic systolic CHF: Admit to telemetry. No recent echocardiogram in system-will request 2-D echocardiogram. Strict input output and daily weights. Lasix 40 mg IV every 12 hours. If patient's creatinine continues to rise or if patient does not improve adequately, consider consultation to heart failure team. 2. Right pleural effusion: Possibly transudate of from decompensated CHF. No symptoms or signs suggestive of pneumonia. Continue IV Lasix and follow clinically and radiology clear. If  he does not improve or dyspnea worsens, may need diagnostic and therapeutic right thoracentesis. 3. Anemia: Possibly of chronic disease and chronic kidney disease. No symptoms suggestive of GI bleeding. Status post 1 unit of PRBCs prior to transfer to [facility]. Hemoglobin has improved. Follow up daily CBCs. 4. Acute on chronic kidney disease:? Cardio renal syndrome versus secondary to diuretics. Monitor BMP daily while on diuretics. If creatinine continues to rise, consider consultation with heart failure team +/-nephrology. 5. Uncontrolled DM 2/IDDM: Lantus and sliding scale insulin. Monitor. 6. Hypertension: Continue carvedilol. Controlled. 7. CAD, status post CABG and stents: Denies chest pain. Troponins negative. 8. Chronic low back pain: Pain management.      Code Status: Full  Family Communication:  discussed with patient.  Disposition Plan:  home when medically stable.   Time spent:  65 minutes  [facility] Pager [phone]  If 7PM-7AM, please contact night-coverage [email or web address] Password [facility] 09/01/2012, 5:35 AM

## 2012-09-01 NOTE — Progress Notes (Signed)
Pt states he takes 1 Sennokot BID and Miralax PRN; MD paged to make aware of pt's request; will await callback.

## 2012-09-01 NOTE — Progress Notes (Signed)
[facility] PROGRESS NOTE  [name] WJX:[id number] DOB: 07-30-1945 DOA: 09/01/2012 PCP: Dina Rich, MD  Brief Narrative: 67 y.o. male with history of type II DM, HTN, hyperlipidemia, CAD status post CABG and stents, chronic kidney disease with baseline creatinine 1.9, chronic systolic CHF (last EF said to be 40%) and GERD was transferred from [facility] ED to [facility] for further evaluation and management of worsening dyspnea and leg swelling. Patient gives a 2 week history of gradually worsening bilateral lower extremity swelling, abdominal distention and DOE. He saw his primary cardiologist last week and was placed on high dose Lasix for a few days. Patient's symptoms initially improved but in the last 3 or 4 days, he is noted worsening DOE when he walks a few steps. He denies dyspnea at rest, chest pain, palpitations. He normally uses 2 pillows which has not changed. He has noticed worsening leg swelling. He presented to the [facility] ED where hemoglobin was 7 g/dL (patient states that recently his hemoglobin has been 9.1 and 8.4 g/dL) and creatinine of 2.5. His case was discussed with the hospitalist and cardiologist there who advised transfer to tertiary hospital. Patient denies any bleeding, vomiting or melena. Patient was transfused 1 unit of PRBCs on route to [facility]. His O2 sats at [facility] was 90% on room air.  Assessment/Plan: 1. Acute on chronic systolic CHF: Admited to telemetry. 2-D echocardiogram done today. Strict input output and daily weights. Lasix 40 mg IV every 12 hours. Cardiology consulted today.  2. Right pleural effusion: Possibly transudate of from decompensated CHF. No symptoms or signs suggestive of pneumonia. Continue IV Lasix and follow clinically and radiology clear, will likely repeat CXR 3/15 after diuresis. If he does not improve or dyspnea worsens, may need diagnostic and therapeutic right thoracentesis. 3. Anemia: Possibly of  chronic disease and chronic kidney disease. No symptoms suggestive of GI bleeding. Status post 1 unit of PRBCs prior to transfer to [facility]. Hemoglobin has improved. Follow up daily CBCs. 4. Acute on chronic kidney disease:? Cardio renal syndrome versus secondary to diuretics. Monitor BMP daily while on diuretics.  5. Uncontrolled DM 2/IDDM: Lantus and sliding scale insulin. Monitor. 6. Hypertension: Continue carvedilol. Controlled. 7. CAD, status post CABG and stents: Denies chest pain. Troponins negative. 8. Chronic low back pain: Pain management.    Code Status: Full Family Communication: none  Disposition Plan: TBD  Consultants:  none  Procedures:  2D echo pending.   Antibiotics:  none  HPI/Subjective: - no complaints this morning other than leg swelling  Objective: Filed Vitals:   09/01/12 0206 09/01/12 0828  BP: 129/74 139/56  Pulse: 46 96  Temp: 98.5 F (36.9 C)   TempSrc: Oral   Resp: 18   Height: 5\' 7"  (1.702 m)   Weight: 81.9 kg (180 lb 8.9 oz)   SpO2: 97%     Intake/Output Summary (Last 24 hours) at 09/01/12 1015 Last data filed at 09/01/12 2130  Gross per 24 hour  Intake    120 ml  Output    350 ml  Net   -230 ml   Filed Weights   09/01/12 0206  Weight: 81.9 kg (180 lb 8.9 oz)    Exam:   General:  NAD  Cardiovascular: regular rate and rhythm, without MRG.   Respiratory: good air movement, clear to auscultation throughout, no wheezing, ronchi or rales  Abdomen: soft, not tender to palpation, positive bowel sounds  MSK: 1-2+ peripheral edema  Neuro: CN 2-12 grossly  intact, MS 5/5 in all 4  Data Reviewed: Basic Metabolic Panel:  Recent Labs Lab 09/01/12 0430  NA 134*  K 3.3*  CL 93*  CO2 30  GLUCOSE 167*  BUN 54*  CREATININE 2.38*  CALCIUM 8.8   Liver Function Tests:  Recent Labs Lab 09/01/12 0430  AST 23  ALT 14  ALKPHOS 131*  BILITOT 1.4*  PROT 6.5  ALBUMIN 2.6*   CBC:  Recent Labs Lab 09/01/12 0430  WBC 8.4   HGB 9.6*  HCT 29.6*  MCV 87.3  PLT 236   Cardiac Enzymes:  Recent Labs Lab 09/01/12 0430  TROPONINI <0.30   BNP (last 3 results)  Recent Labs  09/01/12 0430  PROBNP 10584.0*   CBG:  Recent Labs Lab 09/01/12 0627  GLUCAP 151*   Studies: Dg Chest Port 1 View  09/01/2012  *RADIOLOGY REPORT*  Clinical Data: CHF, cough, shortness of breath.  PORTABLE CHEST - 1 VIEW  Comparison: 10/12/2007  Findings: Prominent cardiomediastinal contours.  Central vascular congestion.  Moderate right pleural effusion and associated airspace opacity.  Mild left lung base opacity.  Degraded by rotation.  Status post median sternotomy and CABG.  Evidence of prior vertebroplasties.  No definite pneumothorax.  IMPRESSION: Right pleural effusion and associated airspace opacity; atelectasis versus pneumonia.  Mild left lung base opacity, favor atelectasis.  Prominent cardiomediastinal contours status post median sternotomy and CABG.   Original Report Authenticated By: Jearld Lesch, M.D.     Scheduled Meds: . aspirin  81 mg Oral Daily  . carvedilol  12.5 mg Oral BID WC  . furosemide  40 mg Intravenous Q12H  . insulin aspart  0-5 Units Subcutaneous QHS  . insulin aspart  0-9 Units Subcutaneous TID WC  . insulin glargine  15 Units Subcutaneous QHS  . multivitamin with minerals  1 tablet Oral Daily  . ranolazine  1,000 mg Oral BID  . simvastatin  20 mg Oral QPM  . sodium chloride  3 mL Intravenous Q12H   Continuous Infusions:   Active Problems:   Anemia   Renal failure (ARF), acute on chronic   Pleural effusion, right   Diabetes type 2, uncontrolled   HTN (hypertension)   Hyperlipidemia   CAD (coronary artery disease), S/P CABG & stents   Systolic CHF, acute on chronic   Chronic LBP  Time spent: 35  Pamella Pert, MD [facility] Pager [phone]. If 7 PM - 7 AM, please contact night-coverage at [email or web address], password [facility] 09/01/2012, 10:15 AM  LOS: 0 days

## 2012-09-01 NOTE — Progress Notes (Signed)
  Echocardiogram 2D Echocardiogram has been performed.  CHUNG, KWONG 09/01/2012, 10:15 AM

## 2012-09-01 NOTE — Consult Note (Signed)
CARDIOLOGY CONSULT NOTE  Patient ID: [name] MRN: [id number] DOB/AGE: 08-02-1945 67 y.o.  Admit date: 09/01/2012 Primary Cardiologist: Dr. Excell Seltzer Reason for Consultation: CHF  HPI:  67 yo with history of CAD s/p CABG and subsequent PCI, presumed diastolic CHF, and CKD presented with dyspnea/CHF exacerbation.  Patient has been seen by Dr. Excell Seltzer in the past but currently seems to be getting most of his care down in [place].  He was in [facility] 3-4 weeks ago with CHF exacerbation and was treated with IV Lasix and discharged.  Initially he felt good at home, but he subsequently developed the slow onset of worsening exertional dyspnea . He was initially taking Lasix 20 mg bid which was increased to 20 mg tid without much effect . Weight is up 6-7 lbs recently.  Yesterday, he was very fatigued and short of breath walking around his house with his walker.  He has had orthopnea and has been sleeping on multiple pillows.  No PND.  No chest pain. He went to [facility] and was transferred from the ER to [facility].  Here, he has received Lasix 40 mg IV and feels much better symptomatically.  Creatinine is elevated at 2.38 but I do not know his most recent baseline (was 1.58 in 5/13).  Echo was done today but was a technically difficult study.    Of note, he was anemic at [facility] and received 1 unit PRBCs.   Review of systems complete and found to be negative unless listed above in HPI  Past Medical History: 1. Type II diabetes 2. HTN 3. Hyperlipidemia 4. CAD:  - CABG 1999 - PCI 2007 to SVG-OM - Last cath 2008 showed patent LIMA-LAD, SVG-OM, and SVG-PDA.  Patient had PTCA to PLV at that time.  5. Diastolic CHF: Last echo in 2009 showed EF 50-55%.  ? EF 40% on echo subsequently in [place].  6. Obesity 7. CKD 8. Anemia  Family History  Problem Relation Age of Onset  . Heart disease Father     History   Social History  . Marital Status: Married    Spouse Name: N/A     Number of Children: N/A  . Years of Education: N/A   Occupational History  . Not on file.   Social History Main Topics  . Smoking status: Never Smoker   . Smokeless tobacco: Not on file  . Alcohol Use: No  . Drug Use: No  . Sexually Active: Not on file   Other Topics Concern  . Not on file   Social History Narrative  . Lives in [place], retired.      Prescriptions prior to admission  Medication Sig Dispense Refill  . aspirin 81 MG tablet Take 81 mg by mouth daily.      . carvedilol (COREG) 3.125 MG tablet Take 3.125 mg by mouth 2 (two) times daily with a meal.      . Cholecalciferol (VITAMIN D PO) Take 1 tablet by mouth daily.      . Cyanocobalamin (VITAMIN B 12 PO) Take 1 tablet by mouth daily.      . furosemide (LASIX) 20 MG tablet Take 20 mg by mouth 3 (three) times daily.      . insulin glargine (LANTUS) 100 UNIT/ML injection Inject 18 Units into the skin daily after breakfast.      . insulin lispro (HUMALOG) 100 UNIT/ML injection Inject 3-10 Units into the skin 3 (three) times daily before meals. Based on sliding scale      .  Multiple Vitamin (MULITIVITAMIN WITH MINERALS) TABS Take 1 tablet by mouth daily.      Marland Kitchen oxyCODONE (OXY IR/ROXICODONE) 5 MG immediate release tablet Take 5 mg by mouth every 4 (four) hours as needed for pain.      . ranolazine (RANEXA) 500 MG 12 hr tablet Take 1,000 mg by mouth 2 (two) times daily.      . simvastatin (ZOCOR) 20 MG tablet Take 20 mg by mouth every evening.       Current Medications . aspirin  81 mg Oral Daily  . carvedilol  12.5 mg Oral BID WC  . [START ON 09/02/2012] furosemide  40 mg Intravenous Q8H  . hydrALAZINE  12.5 mg Oral Q8H  . insulin aspart  0-5 Units Subcutaneous QHS  . insulin aspart  0-9 Units Subcutaneous TID WC  . insulin glargine  15 Units Subcutaneous QHS  . isosorbide mononitrate  30 mg Oral Daily  . multivitamin with minerals  1 tablet Oral Daily  . ranolazine  1,000 mg Oral BID  . senna  1 tablet Oral BID   . simvastatin  20 mg Oral QPM  . sodium chloride  3 mL Intravenous Q12H    Physical exam Blood pressure 131/67, pulse 82, temperature 98.3 F (36.8 C), temperature source Oral, resp. rate 18, height 5\' 7"  (1.702 m), weight 180 lb 8.9 oz (81.9 kg), SpO2 97.00%. General: NAD Neck: JVP 10 cm, no thyromegaly or thyroid nodule.  Lungs: End expiratory wheezes, slightly decreased breath sounds at bases bilaterally.  CV: Heart regular S1/S2, no S3/S4, 2/6 early SEM RUSB, 1/6 HSM apex.  1+ edema 1/2 up lower legs R>L.  No carotid bruit.   Abdomen: Soft, nontender, no hepatosplenomegaly.  Mild to moderately disteneded.  Skin: Intact without lesions or rashes.  Neurologic: Alert and oriented x 3.  Psych: Normal affect. Extremities: No clubbing or cyanosis.  HEENT: Normal.   Labs:   Lab Results  Component Value Date   WBC 8.4 09/01/2012   HGB 9.6* 09/01/2012   HCT 29.6* 09/01/2012   MCV 87.3 09/01/2012   PLT 236 09/01/2012    Recent Labs Lab 09/01/12 0430  NA 134*  K 3.3*  CL 93*  CO2 30  BUN 54*  CREATININE 2.38*  CALCIUM 8.8  PROT 6.5  BILITOT 1.4*  ALKPHOS 131*  ALT 14  AST 23  GLUCOSE 167*   Lab Results  Component Value Date   CKTOTAL 90 01/13/2007   CKMB 2.4 01/13/2007   TROPONINI <0.30 09/01/2012     Radiology:  -CXR: right-sided pleural effusion  EKG: Probably NSR with low voltage Ps (similar to prior ECGs), PVCs, old anterior MI, old inferior MI.   ASSESSMENT AND PLAN:  1. Acute on chronic CHF: I am uncertain of EF as echo today was a poor study. He is volume overloaded on exam with JVD and prominent abdominal distention.  He was discharged from [facility] 3-4 weeks ago and has gradually built up symptoms since and weight is up. He was on a rather low dose of Lasix at home.  - I will try to get a cardiac MRI (without contrast) on him to assess LV and RV function, which were not adequately visualized by echo today.  This will require doing an abdominal CT today without  contrast to make sure that the capsule from his recent capsule endoscopy has passed (he is not sure but it has been a while).   - Increase Lasix to 40 mg IV every 8  hrs.  He seems to be responding to 40 mg IV Lasix bolus so far.  - Will need to follow creatinine closely.  I am unsure what his current baseline is.  He may need milrinone gtt if creatinine rises significantly with diuresis.  It may be that creatinine improves with diuresis as we lower his renal venous pressure.  - Continue Coreg at current dose.  Will add hydralazine and Imdur for afterload reduction (suspect that EF is probably reduced).  No ACEI with current level of renal dysfunction.   - RHC on Monday  2. CAD: S/p CABG and subsequent PCI.  No chest pain, negative cardiac enzymes.  Continue ASA and statin.   He is also on ranolazine.  3. CKD: As above, follow creatinine carefully with diuresis and may need milrinone if it begins to worsen.  I am unsure of his baseline creatinine at this point.  Was 1.5 in 5/13.  4. Anemia: ? Renal disease/chronic disease.  Was transfused at [facility].  Would guaiac stool.  5. Rhythm: Probably NSR with low voltage P waves.  Similar to prior ECG last year.   Marca Ancona 09/01/2012 5:38 PM

## 2012-09-01 NOTE — Evaluation (Signed)
Physical Therapy Evaluation Patient Details Name: [name] MRN: [id number] DOB: 04/21/1946 Today's Date: 09/01/2012 Time: 7829-5621 PT Time Calculation (min): 18 min  PT Assessment / Plan / Recommendation Clinical Impression  Pt s/p CHF and right effusion with decr mobility secondary to decr endurance, decr balance and incr pain in back (chronic).  Pt will benefit from PT to address endurance and balance for improvement in mobility.  Should be able to go home with HHPT f/u.      PT Assessment  Patient needs continued PT services    Follow Up Recommendations  Home health PT;Supervision/Assistance - 24 hour                Equipment Recommendations  None recommended by PT         Frequency Min 3X/week    Precautions / Restrictions Precautions Precautions: Fall Restrictions Weight Bearing Restrictions: No   Pertinent Vitals/Pain VSS, Back pain c/o      Mobility  Bed Mobility Bed Mobility: Sit to Supine Sit to Supine: 4: Min assist Details for Bed Mobility Assistance: Needed assist to control descent into bed.  Transfers Transfers: Sit to Stand;Stand to Sit Sit to Stand: 4: Min guard;With upper extremity assist;From bed Stand to Sit: 4: Min guard;With upper extremity assist;With armrests;To chair/3-in-1;To bed Details for Transfer Assistance: Pt needed cues for hand placement. Ambulation/Gait Ambulation/Gait Assistance: 4: Min guard Ambulation Distance (Feet): 150 Feet Assistive device: Rolling walker Gait Pattern: Step-through pattern;Decreased stride length;Trunk flexed Gait velocity: decreased Stairs: No Wheelchair Mobility Wheelchair Mobility: No         PT Diagnosis: Generalized weakness  PT Problem List: Decreased activity tolerance;Decreased balance;Decreased mobility;Decreased safety awareness;Decreased knowledge of use of DME;Decreased knowledge of precautions;Pain PT Treatment Interventions: DME instruction;Gait training;Functional mobility  training;Therapeutic activities;Therapeutic exercise;Balance training;Patient/family education   PT Goals Acute Rehab PT Goals PT Goal Formulation: With patient Time For Goal Achievement: 09/08/12 Potential to Achieve Goals: Good Pt will go Supine/Side to Sit: Independently PT Goal: Supine/Side to Sit - Progress: Goal set today Pt will Sit at Edge of Bed: Independently;3-5 min;with no upper extremity support PT Goal: Sit at Edge Of Bed - Progress: Goal set today Pt will go Sit to Stand: Independently PT Goal: Sit to Stand - Progress: Goal set today Pt will Transfer Bed to Chair/Chair to Bed: Independently PT Transfer Goal: Bed to Chair/Chair to Bed - Progress: Goal set today Pt will Ambulate: 51 - 150 feet;with modified independence;with least restrictive assistive device PT Goal: Ambulate - Progress: Goal set today  Visit Information  Last PT Received On: 09/01/12 Assistance Needed: +1    Subjective Data  Subjective: "My back hurts so bad." Patient Stated Goal: To go home   Prior Functioning  Home Living Lives With: Spouse Available Help at Discharge: Family Type of Home: House Home Access: Level entry Home Layout: Two level;Able to live on main level with bedroom/bathroom;Full bath on main level Bathroom Shower/Tub: Walk-in shower;Curtain Bathroom Toilet: Handicapped height Home Adaptive Equipment: Environmental consultant - four wheeled Prior Function Level of Independence: Needs assistance;Independent with assistive device(s) Needs Assistance: Bathing;Dressing;Feeding;Grooming;Meal Prep;Light Housekeeping;Toileting Bath: Minimal Dressing: Moderate Feeding: Supervision/set-up Grooming: Supervision/set-up Toileting: Supervision/set-up Meal Prep: Total Light Housekeeping: Total Able to Take Stairs?: No Driving: No Vocation: Retired Musician: No difficulties Dominant Hand: Right    Cognition  Cognition Overall Cognitive Status: Appears within functional limits  for tasks assessed/performed Arousal/Alertness: Awake/alert Orientation Level: Appears intact for tasks assessed Behavior During Session: [facility] for tasks performed  Extremity/Trunk Assessment Right Lower Extremity Assessment RLE ROM/Strength/Tone: WFL for tasks assessed Left Lower Extremity Assessment LLE ROM/Strength/Tone: WFL for tasks assessed Trunk Assessment Trunk Assessment: Normal   Balance Static Standing Balance Static Standing - Balance Support: Bilateral upper extremity supported;During functional activity Static Standing - Level of Assistance: 4: Min assist Static Standing - Comment/# of Minutes: Needs steadying assist at times standing statically with RW.    End of Session PT - End of Session Equipment Utilized During Treatment: Gait belt Activity Tolerance: Patient limited by pain Patient left: in bed;with call bell/phone within reach;with bed alarm set Nurse Communication: Mobility status       INGOLD,DAWN 09/01/2012, 1:11 PM [facility] Acute Rehabilitation [phone] (pager)

## 2012-09-02 DIAGNOSIS — I10 Essential (primary) hypertension: Secondary | ICD-10-CM

## 2012-09-02 LAB — BASIC METABOLIC PANEL
BUN: 58 mg/dL — ABNORMAL HIGH (ref 6–23)
CO2: 31 mEq/L (ref 19–32)
GFR calc non Af Amer: 27 mL/min — ABNORMAL LOW (ref >90)
Glucose, Bld: 151 mg/dL — ABNORMAL HIGH (ref 70–99)
Potassium: 3.5 mEq/L (ref 3.5–5.1)
Sodium: 135 mEq/L (ref 135–145)

## 2012-09-02 LAB — CBC
HCT: 27.8 % — ABNORMAL LOW (ref 39.0–52.0)
Hemoglobin: 9.2 g/dL — ABNORMAL LOW (ref 13.0–17.0)
MCH: 28.8 pg (ref 26.0–34.0)
MCHC: 33.1 g/dL (ref 30.0–36.0)
MCV: 86.9 fL (ref 78.0–100.0)
RBC: 3.2 MIL/uL — ABNORMAL LOW (ref 4.22–5.81)

## 2012-09-02 LAB — GLUCOSE, CAPILLARY
Glucose-Capillary: 158 mg/dL — ABNORMAL HIGH (ref 70–99)
Glucose-Capillary: 196 mg/dL — ABNORMAL HIGH (ref 70–99)
Glucose-Capillary: 199 mg/dL — ABNORMAL HIGH (ref 70–99)

## 2012-09-02 MED ORDER — POTASSIUM CHLORIDE CRYS ER 20 MEQ PO TBCR
40.0000 meq | EXTENDED_RELEASE_TABLET | Freq: Once | ORAL | Status: AC
  Administered 2012-09-02: 40 meq via ORAL
  Filled 2012-09-02: qty 2

## 2012-09-02 NOTE — Progress Notes (Signed)
[facility] PROGRESS NOTE  [name] UJW:[id number] DOB: [birth year] DOA: 09/01/2012 PCP: Dina Rich, MD  Brief Narrative: 67 y.o. male with history of type II DM, HTN, hyperlipidemia, CAD status post CABG and stents, chronic kidney disease with baseline creatinine 1.9, chronic systolic CHF (last EF said to be 40%) and GERD was transferred from [facility] ED to [facility] for further evaluation and management of worsening dyspnea and leg swelling. Patient gives a 2 week history of gradually worsening bilateral lower extremity swelling, abdominal distention and DOE. He saw his primary cardiologist last week and was placed on high dose Lasix for a few days. Patient's symptoms initially improved but in the last 3 or 4 days, he is noted worsening DOE when he walks a few steps. He denies dyspnea at rest, chest pain, palpitations. He normally uses 2 pillows which has not changed. He has noticed worsening leg swelling. He presented to the [facility] ED where hemoglobin was 7 g/dL (patient states that recently his hemoglobin has been 9.1 and 8.4 g/dL) and creatinine of 2.5. His case was discussed with the hospitalist and cardiologist there who advised transfer to tertiary hospital. Patient denies any bleeding, vomiting or melena. Patient was transfused 1 unit of PRBCs on route to [facility]. His O2 sats at [facility] was 90% on room air.  Assessment/Plan: 1. Acute on chronic systolic CHF: Admited to telemetry. 2-D echocardiogram done 3/14, poor windows. Cardiology consulted, will proceed with MR. Strict input output and daily weights. Lasix 40 mg IV every 8 hours, renal function stable today.  2. Right pleural effusion: Possibly transudate of from decompensated CHF. No symptoms or signs suggestive of pneumonia. Continue IV Lasix and follow clinically and radiology clear, will likely repeat CXR after diuresis. If he does not improve or dyspnea worsens, may need diagnostic and  therapeutic right thoracentesis. 3. Anemia: Possibly of chronic disease and chronic kidney disease. No symptoms suggestive of GI bleeding. Status post 1 unit of PRBCs prior to transfer to [facility]. Hemoglobin has improved. Follow up daily CBCs. 4. Acute on chronic kidney disease:? Cardio renal syndrome versus secondary to diuretics. Monitor BMP daily while on diuretics.  5. Uncontrolled DM 2/IDDM: Lantus and sliding scale insulin. Monitor. 6. Hypertension: Continue carvedilol. Controlled. 7. CAD, status post CABG and stents: Denies chest pain. Troponins negative. 8. Chronic low back pain: Pain management.   Code Status: Full Family Communication: none  Disposition Plan: TBD  Consultants:  Cardiology  Procedures:  2D echo  Study Conclusions  - Left ventricle: There is motion at the base. The images are severely limited. There is septal dyssynergy. Endocardium is seen on limited basis. I can not estimate LV function. The cavity size was mildly dilated. Wall thickness was increased in a pattern of mild LVH. - Left atrium: The atrium was moderately to severely dilated. - Right ventricle: Not able to assess RV function due to poor visualization. Not able to assess RV size due to poor visualization. - Impressions: A different technique will be needed to assess LV. Echo with contrast may help.   Antibiotics:  none  HPI/Subjective: - feeling subjectively better, denies breathing difficulties.   Objective: Filed Vitals:   09/01/12 1755 09/01/12 2053 09/02/12 0341 09/02/12 0343  BP: 113/65 131/63 101/60   Pulse: 73 67 81   Temp:  98.4 F (36.9 C) 98.3 F (36.8 C)   TempSrc:  Oral Oral   Resp:  19 19   Height:      Weight:  80.3 kg (177 lb 0.5 oz)  SpO2:  96% 96%     Intake/Output Summary (Last 24 hours) at 09/02/12 1022 Last data filed at 09/02/12 0944  Gross per 24 hour  Intake   1200 ml  Output   1650 ml  Net   -450 ml   Filed Weights   09/01/12 0206 09/02/12  0343  Weight: 81.9 kg (180 lb 8.9 oz) 80.3 kg (177 lb 0.5 oz)    Exam:   General:  NAD  Cardiovascular: regular rate and rhythm, without MRG.   Respiratory: good air movement, clear to auscultation throughout, no wheezing, ronchi or rales  Abdomen: soft, not tender to palpation, positive bowel sounds  MSK: 1 + peripheral edema  Neuro: CN 2-12 grossly intact, MS 5/5 in all 4  Data Reviewed: Basic Metabolic Panel:  Recent Labs Lab 09/01/12 0430 09/02/12 0803  NA 134* 135  K 3.3* 3.5  CL 93* 93*  CO2 30 31  GLUCOSE 167* 151*  BUN 54* 58*  CREATININE 2.38* 2.39*  CALCIUM 8.8 8.5   Liver Function Tests:  Recent Labs Lab 09/01/12 0430  AST 23  ALT 14  ALKPHOS 131*  BILITOT 1.4*  PROT 6.5  ALBUMIN 2.6*   CBC:  Recent Labs Lab 09/01/12 0430 09/02/12 0803  WBC 8.4 7.0  HGB 9.6* 9.2*  HCT 29.6* 27.8*  MCV 87.3 86.9  PLT 236 242   Cardiac Enzymes:  Recent Labs Lab 09/01/12 0430 09/01/12 0933 09/01/12 1602  TROPONINI <0.30 <0.30 <0.30   BNP (last 3 results)  Recent Labs  09/01/12 0430  PROBNP 10584.0*   CBG:  Recent Labs Lab 09/01/12 0627 09/01/12 1105 09/01/12 1605 09/01/12 2220 09/02/12 0639  GLUCAP 151* 241* 153* 231* 158*   Studies: Dg Chest Port 1 View  09/01/2012  *RADIOLOGY REPORT*  Clinical Data: CHF, cough, shortness of breath.  PORTABLE CHEST - 1 VIEW  Comparison: 10/12/2007  Findings: Prominent cardiomediastinal contours.  Central vascular congestion.  Moderate right pleural effusion and associated airspace opacity.  Mild left lung base opacity.  Degraded by rotation.  Status post median sternotomy and CABG.  Evidence of prior vertebroplasties.  No definite pneumothorax.  IMPRESSION: Right pleural effusion and associated airspace opacity; atelectasis versus pneumonia.  Mild left lung base opacity, favor atelectasis.  Prominent cardiomediastinal contours status post median sternotomy and CABG.   Original Report Authenticated By:  Jearld Lesch, M.D.    Dg Abd Portable 1v  09/01/2012  *RADIOLOGY REPORT*  Clinical Data: Recent capsule endoscopy.  Evaluate for retained capsule.  PORTABLE ABDOMEN - 1 VIEW  Comparison: None.  Findings: The bowel gas pattern is within normal limits.  No retained capsule camera identified.  Surgical clips and staples noted, as well as vascular calcifications.  IMPRESSION: No acute findings.  No retained capsule camera identified.   Original Report Authenticated By: Myles Rosenthal, M.D.     Scheduled Meds: . aspirin  81 mg Oral Daily  . carvedilol  12.5 mg Oral BID WC  . furosemide  40 mg Intravenous Q8H  . hydrALAZINE  12.5 mg Oral Q8H  . insulin aspart  0-5 Units Subcutaneous QHS  . insulin aspart  0-9 Units Subcutaneous TID WC  . insulin glargine  15 Units Subcutaneous QHS  . isosorbide mononitrate  30 mg Oral Daily  . multivitamin with minerals  1 tablet Oral Daily  . potassium chloride  40 mEq Oral Once  . ranolazine  1,000 mg Oral BID  . senna  1 tablet Oral BID  . simvastatin  20 mg Oral QPM  . sodium chloride  3 mL Intravenous Q12H   Continuous Infusions:   Active Problems:   Anemia   Renal failure (ARF), acute on chronic   Pleural effusion, right   Diabetes type 2, uncontrolled   HTN (hypertension)   Hyperlipidemia   CAD (coronary artery disease), S/P CABG & stents   Systolic CHF, acute on chronic   Chronic LBP  Time spent: 25  Pamella Pert, MD [facility] Pager [phone]. If 7 PM - 7 AM, please contact night-coverage at [email or web address], password [facility] 09/02/2012, 10:22 AM  LOS: 1 day

## 2012-09-02 NOTE — Progress Notes (Signed)
Patient ID: TIMO HARTWIG, male   DOB: 1945-10-16, 67 y.o.   MRN: [id number]   Patient Name: [name] Date of Encounter: 09/02/2012    SUBJECTIVE  Breathing much better. Says his weight is down 3 pounds since yesterday. Electrolytes pending. Cardiac MRI ordered but not done yet.  CURRENT MEDS . aspirin  81 mg Oral Daily  . carvedilol  12.5 mg Oral BID WC  . furosemide  40 mg Intravenous Q8H  . hydrALAZINE  12.5 mg Oral Q8H  . insulin aspart  0-5 Units Subcutaneous QHS  . insulin aspart  0-9 Units Subcutaneous TID WC  . insulin glargine  15 Units Subcutaneous QHS  . isosorbide mononitrate  30 mg Oral Daily  . multivitamin with minerals  1 tablet Oral Daily  . ranolazine  1,000 mg Oral BID  . senna  1 tablet Oral BID  . simvastatin  20 mg Oral QPM  . sodium chloride  3 mL Intravenous Q12H    OBJECTIVE  Filed Vitals:   09/01/12 1755 09/01/12 2053 09/02/12 0341 09/02/12 0343  BP: 113/65 131/63 101/60   Pulse: 73 67 81   Temp:  98.4 F (36.9 C) 98.3 F (36.8 C)   TempSrc:  Oral Oral   Resp:  19 19   Height:      Weight:    177 lb 0.5 oz (80.3 kg)  SpO2:  96% 96%     Intake/Output Summary (Last 24 hours) at 09/02/12 0909 Last data filed at 09/02/12 0800  Gross per 24 hour  Intake   1200 ml  Output   1550 ml  Net   -350 ml   Filed Weights   09/01/12 0206 09/02/12 0343  Weight: 180 lb 8.9 oz (81.9 kg) 177 lb 0.5 oz (80.3 kg)    PHYSICAL EXAM  General: Pleasant, NAD.chronically ill-appearing Neuro: Alert and oriented X 3. Moves all extremities spontaneously. Psych: Normal affect. HEENT:  Normal  Neck: Supple without bruits , JVD to angle of jaw Lungs:  Resp regular and unlabored, decreased breath sounds throughout Heart: RRR,soft S1-S2 Abdomen: Soft, non-tender, non-distended, BS + x 4.  Extremities: No clubbing, cyanosis, 1+ pitting edema DP/PT/Radials 2+ and equal bilaterally.  Accessory Clinical Findings  CBC  Recent Labs  09/01/12 0430  09/02/12 0803  WBC 8.4 7.0  HGB 9.6* 9.2*  HCT 29.6* 27.8*  MCV 87.3 86.9  PLT 236 242   Basic Metabolic Panel  Recent Labs  09/01/12 0430  NA 134*  K 3.3*  CL 93*  CO2 30  GLUCOSE 167*  BUN 54*  CREATININE 2.38*  CALCIUM 8.8   Liver Function Tests  Recent Labs  09/01/12 0430  AST 23  ALT 14  ALKPHOS 131*  BILITOT 1.4*  PROT 6.5  ALBUMIN 2.6*   No results found for this basename: LIPASE, AMYLASE,  in the last 72 hours Cardiac Enzymes  Recent Labs  09/01/12 0430 09/01/12 0933 09/01/12 1602  TROPONINI <0.30 <0.30 <0.30   BNP No components found with this basename: POCBNP,  D-Dimer No results found for this basename: DDIMER,  in the last 72 hours Hemoglobin A1C  Recent Labs  09/01/12 0933  HGBA1C 6.6*   Fasting Lipid Panel No results found for this basename: CHOL, HDL, LDLCALC, TRIG, CHOLHDL, LDLDIRECT,  in the last 72 hours Thyroid Function Tests  Recent Labs  09/01/12 0933  TSH 1.237    TELE  Normal sinus rhythm  ECG    Radiology/Studies  Dg Chest [facility]  1 View  09/01/2012  *RADIOLOGY REPORT*  Clinical Data: CHF, cough, shortness of breath.  PORTABLE CHEST - 1 VIEW  Comparison: 10/12/2007  Findings: Prominent cardiomediastinal contours.  Central vascular congestion.  Moderate right pleural effusion and associated airspace opacity.  Mild left lung base opacity.  Degraded by rotation.  Status post median sternotomy and CABG.  Evidence of prior vertebroplasties.  No definite pneumothorax.  IMPRESSION: Right pleural effusion and associated airspace opacity; atelectasis versus pneumonia.  Mild left lung base opacity, favor atelectasis.  Prominent cardiomediastinal contours status post median sternotomy and CABG.   Original Report Authenticated By: Jearld Lesch, M.D.    Dg Abd Portable 1v  09/01/2012  *RADIOLOGY REPORT*  Clinical Data: Recent capsule endoscopy.  Evaluate for retained capsule.  PORTABLE ABDOMEN - 1 VIEW  Comparison: None.   Findings: The bowel gas pattern is within normal limits.  No retained capsule camera identified.  Surgical clips and staples noted, as well as vascular calcifications.  IMPRESSION: No acute findings.  No retained capsule camera identified.   Original Report Authenticated By: Myles Rosenthal, M.D.     ASSESSMENT AND PLAN  Active Problems:   Anemia   Renal failure (ARF), acute on chronic   Pleural effusion, right   Diabetes type 2, uncontrolled   HTN (hypertension)   Hyperlipidemia   CAD (coronary artery disease), S/P CABG & stents   Systolic CHF, acute on chronic   Chronic LBP    Will check electrolytes and renal function today and tomorrow. Continue diuresis. For cardiac MRI to evaluate LV function.I suspect chronic systolic dysfunction.  Signed, Valera Castle MD

## 2012-09-02 NOTE — Progress Notes (Signed)
Pharmacist Heart Failure Core Measure Documentation  Assessment: [name] has an EF reported as 40%  Rationale: Heart failure patients with left ventricular systolic dysfunction (LVSD) and an EF < 40% should be prescribed an angiotensin converting enzyme inhibitor (ACEI) or angiotensin receptor blocker (ARB) at discharge unless a contraindication is documented in the medical record.  This patient is not currently on an ACEI or ARB for HF.  This note is being placed in the record in order to provide documentation that a contraindication to the use of these agents is present for this encounter.  ACE Inhibitor or Angiotensin Receptor Blocker is contraindicated (specify all that apply)  []   ACEI allergy AND ARB allergy []   Angioedema []   Moderate or severe aortic stenosis []   Hyperkalemia []   Hypotension []   Renal artery stenosis [x]   Worsening renal function, preexisting renal disease or dysfunction   [name] 09/02/2012 2:17 PM

## 2012-09-03 ENCOUNTER — Inpatient Hospital Stay (HOSPITAL_COMMUNITY): Payer: PRIVATE HEALTH INSURANCE

## 2012-09-03 LAB — CBC
MCH: 28.6 pg (ref 26.0–34.0)
MCHC: 32.8 g/dL (ref 30.0–36.0)
MCV: 87 fL (ref 78.0–100.0)
Platelets: 252 10*3/uL (ref 150–400)
RDW: 16.3 % — ABNORMAL HIGH (ref 11.5–15.5)

## 2012-09-03 LAB — GLUCOSE, CAPILLARY
Glucose-Capillary: 163 mg/dL — ABNORMAL HIGH (ref 70–99)
Glucose-Capillary: 203 mg/dL — ABNORMAL HIGH (ref 70–99)
Glucose-Capillary: 169 mg/dL — ABNORMAL HIGH (ref 70–99)
Glucose-Capillary: 173 mg/dL — ABNORMAL HIGH (ref 70–99)

## 2012-09-03 LAB — BASIC METABOLIC PANEL
BUN: 67 mg/dL — ABNORMAL HIGH (ref 6–23)
CO2: 32 mEq/L (ref 19–32)
Calcium: 8.6 mg/dL (ref 8.4–10.5)
Creatinine, Ser: 2.64 mg/dL — ABNORMAL HIGH (ref 0.50–1.35)
GFR calc non Af Amer: 24 mL/min — ABNORMAL LOW (ref >90)
Glucose, Bld: 172 mg/dL — ABNORMAL HIGH (ref 70–99)

## 2012-09-03 MED ORDER — POTASSIUM CHLORIDE CRYS ER 20 MEQ PO TBCR
40.0000 meq | EXTENDED_RELEASE_TABLET | Freq: Once | ORAL | Status: AC
  Administered 2012-09-03: 40 meq via ORAL

## 2012-09-03 MED ORDER — POTASSIUM CHLORIDE CRYS ER 20 MEQ PO TBCR
EXTENDED_RELEASE_TABLET | ORAL | Status: AC
  Filled 2012-09-03: qty 2

## 2012-09-03 MED ORDER — MILRINONE IN DEXTROSE 20 MG/100ML IV SOLN
0.2500 ug/kg/min | INTRAVENOUS | Status: DC
  Administered 2012-09-03 – 2012-09-04 (×3): 0.125 ug/kg/min via INTRAVENOUS
  Administered 2012-09-05 – 2012-09-06 (×2): 0.25 ug/kg/min via INTRAVENOUS
  Filled 2012-09-03 (×6): qty 100

## 2012-09-03 MED ORDER — FUROSEMIDE 10 MG/ML IJ SOLN
40.0000 mg | Freq: Two times a day (BID) | INTRAMUSCULAR | Status: DC
  Administered 2012-09-03 – 2012-09-04 (×2): 40 mg via INTRAVENOUS
  Filled 2012-09-03 (×3): qty 4

## 2012-09-03 NOTE — Progress Notes (Signed)
[facility] PROGRESS NOTE  [name]:[id number] DOB: April 22, 1946 DOA: 09/01/2012 PCP: Dina Rich, MD  Brief Narrative: 67 y.o. male with history of type II DM, HTN, hyperlipidemia, CAD status post CABG and stents, chronic kidney disease with baseline creatinine 1.9, chronic systolic CHF (last EF said to be 40%) and GERD was transferred from [facility] ED to [facility] for further evaluation and management of worsening dyspnea and leg swelling. Patient gives a 2 week history of gradually worsening bilateral lower extremity swelling, abdominal distention and DOE. He saw his primary cardiologist last week and was placed on high dose Lasix for a few days. Patient's symptoms initially improved but in the last 3 or 4 days, he is noted worsening DOE when he walks a few steps. He denies dyspnea at rest, chest pain, palpitations. He normally uses 2 pillows which has not changed. He has noticed worsening leg swelling. He presented to the [facility] ED where hemoglobin was 7 g/dL (patient states that recently his hemoglobin has been 9.1 and 8.4 g/dL) and creatinine of 2.5. His case was discussed with the hospitalist and cardiologist there who advised transfer to tertiary hospital. Patient denies any bleeding, vomiting or melena. Patient was transfused 1 unit of PRBCs on route to [facility]. His O2 sats at [facility] was 90% on room air.  Assessment/Plan: 1. Acute on chronic systolic CHF: Admited to telemetry. 2-D echocardiogram done 3/14, poor windows. Cardiology consulted, will proceed with MRI likely on Monday. His renal function was a bit worse this morning. Appreciate cardiology input, patient was started on milrinone today. 2. Right pleural effusion: Possibly transudate of from decompensated CHF. No symptoms or signs suggestive of pneumonia. Continue IV Lasix and follow clinically and radiology clear, will likely repeat a lateral decubitus CXR today. If he does not improve  or dyspnea worsens, may need diagnostic and therapeutic right thoracentesis. 3. Anemia: Possibly of chronic disease and chronic kidney disease. No symptoms suggestive of GI bleeding. Status post 1 unit of PRBCs prior to transfer to [facility]. Hemoglobin has improved. Follow up daily CBCs. 4. Acute on chronic kidney disease: Likely secondary to cardiorenal syndrome, we'll continue to monitor following diuresis. 5. DM2 - continue Lantus and sliding scale insulin  6. Attention - on Carvedilol. Controlled. 7. CAD, status post CABG and stents: Denies chest pain. Troponins negative. 8. Chronic low back pain: Pain management.   Code Status: Full Family Communication: none  Disposition Plan: TBD  Consultants:  Cardiology  Procedures:  2D echo  Study Conclusions  - Left ventricle: There is motion at the base. The images are severely limited. There is septal dyssynergy. Endocardium is seen on limited basis. I can not estimate LV function. The cavity size was mildly dilated. Wall thickness was increased in a pattern of mild LVH. - Left atrium: The atrium was moderately to severely dilated. - Right ventricle: Not able to assess RV function due to poor visualization. Not able to assess RV size due to poor visualization. - Impressions: A different technique will be needed to assess LV. Echo with contrast may help.   Antibiotics:  none  HPI/Subjective: - feeling well today, able to ambulate ok   Objective: Filed Vitals:   09/02/12 1658 09/02/12 2021 09/03/12 0416 09/03/12 1001  BP: 117/77 100/58 111/65 104/60  Pulse: 75 69 66   Temp:  97.8 F (36.6 C) 98.8 F (37.1 C)   TempSrc:  Oral Oral   Resp:  19 19   Height:  Weight:   81.239 kg (179 lb 1.6 oz)   SpO2:  95% 96%     Intake/Output Summary (Last 24 hours) at 09/03/12 1150 Last data filed at 09/03/12 0300  Gross per 24 hour  Intake      0 ml  Output    425 ml  Net   -425 ml   Filed Weights   09/01/12 0206 09/02/12  0343 09/03/12 0416  Weight: 81.9 kg (180 lb 8.9 oz) 80.3 kg (177 lb 0.5 oz) 81.239 kg (179 lb 1.6 oz)    Exam:   General:  NAD  Cardiovascular: regular rate and rhythm, without MRG.   Respiratory: good air movement, clear to auscultation throughout, no wheezing, ronchi or rales, decreased breath sounds in the right lower base   Abdomen: soft, not tender to palpation, positive bowel sounds  MSK: 1 + peripheral edema  Neuro: CN 2-12 grossly intact, MS 5/5 in all 4  Data Reviewed: Basic Metabolic Panel:  Recent Labs Lab 09/01/12 0430 09/02/12 0803 09/03/12 0522  NA 134* 135 134*  K 3.3* 3.5 3.3*  CL 93* 93* 92*  CO2 30 31 32  GLUCOSE 167* 151* 172*  BUN 54* 58* 67*  CREATININE 2.38* 2.39* 2.64*  CALCIUM 8.8 8.5 8.6   Liver Function Tests:  Recent Labs Lab 09/01/12 0430  AST 23  ALT 14  ALKPHOS 131*  BILITOT 1.4*  PROT 6.5  ALBUMIN 2.6*   CBC:  Recent Labs Lab 09/01/12 0430 09/02/12 0803 09/03/12 0522  WBC 8.4 7.0 6.9  HGB 9.6* 9.2* 9.0*  HCT 29.6* 27.8* 27.4*  MCV 87.3 86.9 87.0  PLT 236 242 252   Cardiac Enzymes:  Recent Labs Lab 09/01/12 0430 09/01/12 0933 09/01/12 1602  TROPONINI <0.30 <0.30 <0.30   BNP (last 3 results)  Recent Labs  09/01/12 0430  PROBNP 10584.0*   CBG:  Recent Labs Lab 09/02/12 0639 09/02/12 1111 09/02/12 2112 09/03/12 0555 09/03/12 1137  GLUCAP 158* 196* 199* 163* 203*   Studies: Dg Abd Portable 1v  09/01/2012  *RADIOLOGY REPORT*  Clinical Data: Recent capsule endoscopy.  Evaluate for retained capsule.  PORTABLE ABDOMEN - 1 VIEW  Comparison: None.  Findings: The bowel gas pattern is within normal limits.  No retained capsule camera identified.  Surgical clips and staples noted, as well as vascular calcifications.  IMPRESSION: No acute findings.  No retained capsule camera identified.   Original Report Authenticated By: Myles Rosenthal, M.D.     Scheduled Meds: . aspirin  81 mg Oral Daily  . carvedilol  12.5  mg Oral BID WC  . furosemide  40 mg Intravenous BID  . hydrALAZINE  12.5 mg Oral Q8H  . insulin aspart  0-5 Units Subcutaneous QHS  . insulin aspart  0-9 Units Subcutaneous TID WC  . insulin glargine  15 Units Subcutaneous QHS  . isosorbide mononitrate  30 mg Oral Daily  . multivitamin with minerals  1 tablet Oral Daily  . potassium chloride  40 mEq Oral Once  . ranolazine  1,000 mg Oral BID  . senna  1 tablet Oral BID  . simvastatin  20 mg Oral QPM  . sodium chloride  3 mL Intravenous Q12H   Continuous Infusions: . milrinone      Active Problems:   Anemia   Renal failure (ARF), acute on chronic   Pleural effusion, right   Diabetes type 2, uncontrolled   HTN (hypertension)   Hyperlipidemia   CAD (coronary artery disease), S/P CABG &  stents   Systolic CHF, acute on chronic   Chronic LBP  Pamella Pert, MD [facility] Pager [phone]. If 7 PM - 7 AM, please contact night-coverage at [email or web address], password [facility] 09/03/2012, 11:50 AM  LOS: 2 days

## 2012-09-03 NOTE — Progress Notes (Signed)
Pt cannot have contrast

## 2012-09-03 NOTE — Progress Notes (Signed)
Patient ID: KHADAR MONGER, male   DOB: 1946/03/14, 67 y.o.   MRN: [id number]   Patient Name: [name] Date of Encounter: 09/03/2012    SUBJECTIVE  Did not rest well last night. He is less short of breath than on admission.  CURRENT MEDS . aspirin  81 mg Oral Daily  . carvedilol  12.5 mg Oral BID WC  . furosemide  40 mg Intravenous Q8H  . hydrALAZINE  12.5 mg Oral Q8H  . insulin aspart  0-5 Units Subcutaneous QHS  . insulin aspart  0-9 Units Subcutaneous TID WC  . insulin glargine  15 Units Subcutaneous QHS  . isosorbide mononitrate  30 mg Oral Daily  . multivitamin with minerals  1 tablet Oral Daily  . ranolazine  1,000 mg Oral BID  . senna  1 tablet Oral BID  . simvastatin  20 mg Oral QPM  . sodium chloride  3 mL Intravenous Q12H    OBJECTIVE  Filed Vitals:   09/02/12 1334 09/02/12 1658 09/02/12 2021 09/03/12 0416  BP: 89/49 117/77 100/58 111/65  Pulse: 73 75 69 66  Temp: 99 F (37.2 C)  97.8 F (36.6 C) 98.8 F (37.1 C)  TempSrc: Oral  Oral Oral  Resp: 18  19 19   Height:      Weight:    179 lb 1.6 oz (81.239 kg)  SpO2: 97%  95% 96%    Intake/Output Summary (Last 24 hours) at 09/03/12 8657 Last data filed at 09/03/12 0300  Gross per 24 hour  Intake      0 ml  Output    525 ml  Net   -525 ml   Filed Weights   09/01/12 0206 09/02/12 0343 09/03/12 0416  Weight: 180 lb 8.9 oz (81.9 kg) 177 lb 0.5 oz (80.3 kg) 179 lb 1.6 oz (81.239 kg)    PHYSICAL EXAM  General: Pleasant, NAD.looks much older than stated age Neuro: Alert and oriented X 3. Moves all extremities spontaneously. Psych:depressed HEENT:  Normal  Neck: Supple without bruits, JVD Lungs:  Resp regular and unlabored, dullness to percussion and decreased breath sounds on the right base consistent with pleural effusion Heart: RRR no s3, s4, or murmurs. Abdomen: Soft, non-tender, non-distended, BS + x 4.  Extremities: No clubbing, cyanosis, 1+ pitting edema, DP/PT/Radials 1+ and equal  bilaterally.  Accessory Clinical Findings  CBC  Recent Labs  09/02/12 0803 09/03/12 0522  WBC 7.0 6.9  HGB 9.2* 9.0*  HCT 27.8* 27.4*  MCV 86.9 87.0  PLT 242 252   Basic Metabolic Panel  Recent Labs  09/02/12 0803 09/03/12 0522  NA 135 134*  K 3.5 3.3*  CL 93* 92*  CO2 31 32  GLUCOSE 151* 172*  BUN 58* 67*  CREATININE 2.39* 2.64*  CALCIUM 8.5 8.6   Liver Function Tests  Recent Labs  09/01/12 0430  AST 23  ALT 14  ALKPHOS 131*  BILITOT 1.4*  PROT 6.5  ALBUMIN 2.6*   No results found for this basename: LIPASE, AMYLASE,  in the last 72 hours Cardiac Enzymes  Recent Labs  09/01/12 0430 09/01/12 0933 09/01/12 1602  TROPONINI <0.30 <0.30 <0.30   BNP No components found with this basename: POCBNP,  D-Dimer No results found for this basename: DDIMER,  in the last 72 hours Hemoglobin A1C  Recent Labs  09/01/12 0933  HGBA1C 6.6*   Fasting Lipid Panel No results found for this basename: CHOL, HDL, LDLCALC, TRIG, CHOLHDL, LDLDIRECT,  in the last 72  hours Thyroid Function Tests  Recent Labs  09/01/12 0933  TSH 1.237    TELE  Sinus rhythm  ECG    Radiology/Studies  Dg Chest Port 1 View  09/01/2012  *RADIOLOGY REPORT*  Clinical Data: CHF, cough, shortness of breath.  PORTABLE CHEST - 1 VIEW  Comparison: 10/12/2007  Findings: Prominent cardiomediastinal contours.  Central vascular congestion.  Moderate right pleural effusion and associated airspace opacity.  Mild left lung base opacity.  Degraded by rotation.  Status post median sternotomy and CABG.  Evidence of prior vertebroplasties.  No definite pneumothorax.  IMPRESSION: Right pleural effusion and associated airspace opacity; atelectasis versus pneumonia.  Mild left lung base opacity, favor atelectasis.  Prominent cardiomediastinal contours status post median sternotomy and CABG.   Original Report Authenticated By: Jearld Lesch, M.D.    Dg Abd Portable 1v  09/01/2012  *RADIOLOGY REPORT*   Clinical Data: Recent capsule endoscopy.  Evaluate for retained capsule.  PORTABLE ABDOMEN - 1 VIEW  Comparison: None.  Findings: The bowel gas pattern is within normal limits.  No retained capsule camera identified.  Surgical clips and staples noted, as well as vascular calcifications.  IMPRESSION: No acute findings.  No retained capsule camera identified.   Original Report Authenticated By: Myles Rosenthal, M.D.     ASSESSMENT AND PLAN  Active Problems:   Anemia   Renal failure (ARF), acute on chronic   Pleural effusion, right   Diabetes type 2, uncontrolled   HTN (hypertension)   Hyperlipidemia   CAD (coronary artery disease), S/P CABG & stents   Systolic CHF, acute on chronic   Chronic LBP   We have a significant dilemma with this gentleman. He has a fairly large right pleural effusion by exam which was tapped  a couple months ago he says at [facility] has severe renal dysfunction which is probably baseline but is worse after attempted diuresis. He is chronically anemic which is probably from his renal disease. No obvious blood loss.  We'll obtain a lateral decubitus x-ray for possible thoracentesis.We'll begin low-dose milrinone as suggested by Dr. Jearld Pies in hopes of further diuresis and improvement in renal function. We'll back off of IV Lasix on today. Recheck electrolytes and renal function morning. We'll order a cardiac MRI to assess left ventricular ejection fraction. Possible right heart cath prior to discharge.He needs to be enrolled in the high risk for failure clinic.  Signed, Valera Castle MD

## 2012-09-04 ENCOUNTER — Inpatient Hospital Stay (HOSPITAL_COMMUNITY): Payer: PRIVATE HEALTH INSURANCE

## 2012-09-04 DIAGNOSIS — I428 Other cardiomyopathies: Secondary | ICD-10-CM

## 2012-09-04 LAB — GLUCOSE, CAPILLARY
Glucose-Capillary: 151 mg/dL — ABNORMAL HIGH (ref 70–99)
Glucose-Capillary: 91 mg/dL (ref 70–99)

## 2012-09-04 LAB — BASIC METABOLIC PANEL
Chloride: 93 mEq/L — ABNORMAL LOW (ref 96–112)
Creatinine, Ser: 2.72 mg/dL — ABNORMAL HIGH (ref 0.50–1.35)
GFR calc Af Amer: 27 mL/min — ABNORMAL LOW (ref >90)
Sodium: 132 mEq/L — ABNORMAL LOW (ref 135–145)

## 2012-09-04 MED ORDER — CARVEDILOL 6.25 MG PO TABS
6.2500 mg | ORAL_TABLET | Freq: Two times a day (BID) | ORAL | Status: DC
  Administered 2012-09-04 – 2012-09-05 (×2): 6.25 mg via ORAL
  Filled 2012-09-04 (×4): qty 1

## 2012-09-04 MED ORDER — LORAZEPAM 2 MG/ML IJ SOLN
0.5000 mg | INTRAMUSCULAR | Status: AC | PRN
  Administered 2012-09-04: 0.5 mg via INTRAVENOUS

## 2012-09-04 MED ORDER — LORAZEPAM 2 MG/ML IJ SOLN
INTRAMUSCULAR | Status: AC
  Administered 2012-09-04: 0.5 mg via INTRAVENOUS
  Filled 2012-09-04: qty 1

## 2012-09-04 MED ORDER — FUROSEMIDE 10 MG/ML IJ SOLN
40.0000 mg | Freq: Every day | INTRAMUSCULAR | Status: DC
  Administered 2012-09-05: 40 mg via INTRAVENOUS
  Filled 2012-09-04: qty 4

## 2012-09-04 NOTE — Progress Notes (Signed)
PT Cancellation Note  Patient Details Name: STEPFON RAWLES MRN: [id number] DOB: 1946-05-22   Cancelled Treatment:    Reason Eval/Treat Not Completed: Patient at procedure or test/unavailable (pt going to MRI will attempt later as time allows)   Delaney Meigs, PT [phone]

## 2012-09-04 NOTE — Progress Notes (Signed)
Patient Name: BENJERMAN MOLINELLI Date of Encounter: 09/04/2012  Active Problems:   Anemia   Renal failure (ARF), acute on chronic   Pleural effusion, right   Diabetes type 2, uncontrolled   HTN (hypertension)   Hyperlipidemia   CAD (coronary artery disease), S/P CABG & stents   Systolic CHF, acute on chronic   Chronic LBP    SUBJECTIVE: No chest pain, eating well, feels like he's breathing better than yesterday.  OBJECTIVE Filed Vitals:   09/04/12 0413 09/04/12 1050 09/04/12 1351 09/04/12 1622  BP: 115/55 105/59 112/60 100/55  Pulse: 74 72 68 66  Temp: 98.2 F (36.8 C)  98.5 F (36.9 C)   TempSrc: Oral  Oral   Resp: 20  18   Height:      Weight: 179 lb 8 oz (81.421 kg)     SpO2: 95%  95%     Intake/Output Summary (Last 24 hours) at 09/04/12 1723 Last data filed at 09/04/12 1503  Gross per 24 hour  Intake    720 ml  Output    975 ml  Net   -255 ml   Filed Weights   09/02/12 0343 09/03/12 0416 09/04/12 0413  Weight: 177 lb 0.5 oz (80.3 kg) 179 lb 1.6 oz (81.239 kg) 179 lb 8 oz (81.421 kg)    PHYSICAL EXAM General: Well developed, well nourished, male in no acute distress. Head: Normocephalic, atraumatic.  Neck: Supple without bruits, JVD at 12 cm. Lungs:  Resp regular and unlabored, decreased breath sounds bases with some crackles. Heart: RRR, S1, S2, no S3, S4, 2/6 murmur; no rub. Abdomen: Soft, non-tender, non-distended, BS + x 4.  Extremities: No clubbing, cyanosis, no edema.  Neuro: Alert and oriented X 3. Moves all extremities spontaneously. Psych: Normal affect.  LABS: CBC:  Recent Labs  09/02/12 0803 09/03/12 0522  WBC 7.0 6.9  HGB 9.2* 9.0*  HCT 27.8* 27.4*  MCV 86.9 87.0  PLT 242 252   Basic Metabolic Panel:  Recent Labs  65/78/46 0522 09/04/12 0543  NA 134* 132*  K 3.3* 4.4  CL 92* 93*  CO2 32 26  GLUCOSE 172* 158*  BUN 67* 74*  CREATININE 2.64* 2.72*  CALCIUM 8.6 8.8   BNP: Pro B Natriuretic peptide (BNP)  Date/Time Value  Range Status  09/01/2012  4:30 AM 10584.0* 0 - 125 pg/mL Final  10/12/2007  6:05 PM 403.0*  Final   TELE:  SR with PVCs  Radiology/Studies: Mr Card Morphology W/o Cm 09/04/2012  *RADIOLOGY REPORT*  Clinical Data: Cardiomyopathy, unable to assess LV function by echo.  MR CARDIA MORPHOLOGY WITHOUT CONTRAST  GE 1.5 T magnet with dedicated cardiac coil.  FIESTA sequences for function and morphology.  Free-breathing sequences were used.  No contrast was used due to renal dysfunction.  EF was calculated at a dedicated workstation.  Contrast:  Comparison: None.  Findings: Difficult study as patient had a hard time holding his breath.  Free-breathing sequences were used.  Normal left ventricular size and wall thickness.  The mid to apical anterior, mid to apical anteroseptal, and mid to apical inferior walls appeared moderately to severely hypokinetic. EF was calculated to be 37%.  Normal right ventricular size and systolic function. Moderate left atrial enlargement.  Mild to moderate right atrial enlargement.  There appeared to be mild mitral regurgitation though flow sequences to quantify were not done.  Trileaflet aortic valve without significant stenosis.  Measurements:  LV EDV 193 mL  LV SV 71 mL  LV EF 37%  IMPRESSION: Normal LV size with moderate systolic dysfunction, EF 37%.  Wall motion abnormalities as noted above.  Technically difficult study due to poor breath-holding.   Original Report Authenticated By: Marca Ancona     Dg Chest Right Decubitus 09/03/2012  *RADIOLOGY REPORT*  Clinical Data: Right pleural effusion.  CHEST - RIGHT DECUBITUS  Comparison: 09/01/2012  Findings: A right lateral decubitus view of the chest demonstrates no definite layering effusion. Right lateral basilar opacity is noted and could represent consolidation versus loculated pleural fluid.  IMPRESSION: No evidence of free flowing effusion.  Right lateral basilar opacity again identified - question consolidation versus loculated  pleural fluid.   Original Report Authenticated By: Harmon Pier, M.D.    Dg Chest Port 1 View 09/01/2012  *RADIOLOGY REPORT*  Clinical Data: CHF, cough, shortness of breath.  PORTABLE CHEST - 1 VIEW  Comparison: 10/12/2007  Findings: Prominent cardiomediastinal contours.  Central vascular congestion.  Moderate right pleural effusion and associated airspace opacity.  Mild left lung base opacity.  Degraded by rotation.  Status post median sternotomy and CABG.  Evidence of prior vertebroplasties.  No definite pneumothorax.  IMPRESSION: Right pleural effusion and associated airspace opacity; atelectasis versus pneumonia.  Mild left lung base opacity, favor atelectasis.  Prominent cardiomediastinal contours status post median sternotomy and CABG.   Original Report Authenticated By: Jearld Lesch, M.D.    Dg Abd Portable 1v 09/01/2012  *RADIOLOGY REPORT*  Clinical Data: Recent capsule endoscopy.  Evaluate for retained capsule.  PORTABLE ABDOMEN - 1 VIEW  Comparison: None.  Findings: The bowel gas pattern is within normal limits.  No retained capsule camera identified.  Surgical clips and staples noted, as well as vascular calcifications.  IMPRESSION: No acute findings.  No retained capsule camera identified.   Original Report Authenticated By: Myles Rosenthal, M.D.     Current Medications:  . aspirin  81 mg Oral Daily  . carvedilol  6.25 mg Oral BID WC  . furosemide  40 mg Intravenous BID  . hydrALAZINE  12.5 mg Oral Q8H  . insulin aspart  0-5 Units Subcutaneous QHS  . insulin aspart  0-9 Units Subcutaneous TID WC  . insulin glargine  15 Units Subcutaneous QHS  . isosorbide mononitrate  30 mg Oral Daily  . multivitamin with minerals  1 tablet Oral Daily  . ranolazine  1,000 mg Oral BID  . senna  1 tablet Oral BID  . simvastatin  20 mg Oral QPM  . sodium chloride  3 mL Intravenous Q12H   . milrinone 0.125 mcg/kg/min (09/04/12 1308)    ASSESSMENT AND PLAN:   Systolic CHF, acute on chronic - Milrinone  added and IV Lasix decreased from 40 mg TID to BID but Cr still with slight trend up. His weight increased from 3/15->3/16 but held steady overnight to 3/17. MRI is resulted and shows normal RV function with decreased LV function. UOP has been poor today despite IV Lasix this am. MD advise on med changes.   Otherwise, per primary MD. Active Problems:   Anemia   Renal failure (ARF), acute on chronic   Pleural effusion, right   Diabetes type 2, uncontrolled   HTN (hypertension)   Hyperlipidemia   CAD (coronary artery disease), S/P CABG & stents   Chronic LBP   Signed, Theodore Demark , PA-C 5:23 PM 09/04/2012  Patient seen, examined. Available data reviewed. Agree with findings, assessment, and plan as outlined by Theodore Demark, PA-C. I know Mr Whitefield well but  haven't seen him in several years since he has been followed at the [facility] Failure [facility] at [facility]. Exam reveals elderly appearing gentleman in NAD, external jugular veins distended but JVP appears 5-10 cm. Lungs are clear. Heart sounds distant without murmur. No peripheral edema. [facility] MRI result reviewed, LVEF 37%. D/W Dr Shirlee Latch and requested the CHF team see him tomorrow. I think a right heart cath may help with his management. For now will continue Milrinone and hold Lasix tonight considering his low BP. CKD has stabilized.  Tonny Bollman, M.D. 09/04/2012 6:17 PM

## 2012-09-04 NOTE — Progress Notes (Signed)
Physical Therapy Treatment Patient Details Name: [name] MRN: [id number] DOB: 09-08-1945 Today's Date: 09/04/2012 Time: 7829-5621 PT Time Calculation (min): 25 min  PT Assessment / Plan / Recommendation Comments on Treatment Session  Pt admitted with CHF and pleural effusion limited by lethargy today after MRI where ativan received and pt required increased assist for all mobility and HEP due to lethargy. Will continue to follow    Follow Up Recommendations        Does the patient have the potential to tolerate intense rehabilitation     Barriers to Discharge        Equipment Recommendations       Recommendations for Other Services    Frequency     Plan Discharge plan remains appropriate;Frequency remains appropriate    Precautions / Restrictions Precautions Precautions: Fall   Pertinent Vitals/Pain No pain    Mobility  Bed Mobility Bed Mobility: Supine to Sit Supine to Sit: 4: Min guard;HOB flat;With rails Transfers Sit to Stand: 4: Min assist;From bed Stand to Sit: 4: Min assist;To chair/3-in-1 Details for Transfer Assistance: cueing for hand placement with assist for posture and stability due to lethargy Ambulation/Gait Ambulation/Gait Assistance: 3: Mod assist Ambulation Distance (Feet): 100 Feet Assistive device: Rolling walker Ambulation/Gait Assistance Details: max cueing for posture, position in RW and control of RW. Pt initially min assist 1st 1' but increased difficulty with gait second half returning to room with increased postural support and control of RW Gait Pattern: Trunk flexed;Shuffle Gait velocity: decreased Stairs: No    Exercises General Exercises - Lower Extremity Short Arc Quad: AAROM;20 reps;Both;Seated Hip ABduction/ADduction: AAROM;Both;20 reps;Seated Hip Flexion/Marching: AAROM;Both;20 reps;Seated   PT Diagnosis:    PT Problem List:   PT Treatment Interventions:     PT Goals Acute Rehab PT Goals PT Goal: Supine/Side to Sit  - Progress: Progressing toward goal PT Goal: Sit to Stand - Progress: Progressing toward goal PT Transfer Goal: Bed to Chair/Chair to Bed - Progress: Progressing toward goal PT Goal: Ambulate - Progress: Progressing toward goal  Visit Information  Last PT Received On: 09/04/12 Assistance Needed: +1 Reason Eval/Treat Not Completed: Patient at procedure or test/unavailable (pt going to MRI will attempt later as time allows)    Subjective Data  Subjective: "they gave me some medicine"- referring to lethargy after MRI   Cognition  Cognition Arousal/Alertness: Lethargic Orientation Level: Disoriented to;Time Behavior During Session: Lethargic Cognition - Other Comments: pt just returned from MRI and received ativan    Balance     End of Session PT - End of Session Equipment Utilized During Treatment: Gait belt Activity Tolerance: Patient limited by fatigue Patient left: in chair;with call bell/phone within reach;with nursing in room Nurse Communication: Mobility status   GP     Delorse Lek 09/04/2012, 11:16 AM Delaney Meigs, PT [phone]

## 2012-09-04 NOTE — Progress Notes (Signed)
[facility] PROGRESS NOTE  [name] WUJ:[id number] DOB: [birth year] DOA: 09/01/2012 PCP: Dina Rich, MD  Brief Narrative: 67 y.o. male with history of type II DM, HTN, hyperlipidemia, CAD status post CABG and stents, chronic kidney disease with baseline creatinine 1.9, chronic systolic CHF (last EF said to be 40%) and GERD was transferred from [facility] ED to [facility] for further evaluation and management of worsening dyspnea and leg swelling. Patient gives a 2 week history of gradually worsening bilateral lower extremity swelling, abdominal distention and DOE. He saw his primary cardiologist last week and was placed on high dose Lasix for a few days. Patient's symptoms initially improved but in the last 3 or 4 days, he is noted worsening DOE when he walks a few steps. He denies dyspnea at rest, chest pain, palpitations. He normally uses 2 pillows which has not changed. He has noticed worsening leg swelling. He presented to the [facility] ED where hemoglobin was 7 g/dL (patient states that recently his hemoglobin has been 9.1 and 8.4 g/dL) and creatinine of 2.5. His case was discussed with the hospitalist and cardiologist there who advised transfer to tertiary hospital. Patient denies any bleeding, vomiting or melena. Patient was transfused 1 unit of PRBCs on route to [facility]. His O2 sats at [facility] was 90% on room air.  Assessment/Plan: 1. Acute on chronic systolic CHF: Admited to telemetry. 2-D echocardiogram done 3/14, poor windows. Cardiology consulted, had cardiac MRI 3/17, read is pending. His renal function is worsening. Appreciate cardiology input, patient was started on milrinone 09/03/2012. 2. Right pleural effusion: Possibly transudate of from decompensated CHF. No symptoms or signs suggestive of pneumonia.Lateral decubitus shows no evidence of free flowing effusion, consolidation vs. Loculated. Hopefully MRI will elucidate, if not, will need CT chest  without contrast.  3. Anemia: Possibly of chronic disease and chronic kidney disease. No symptoms suggestive of GI bleeding. Status post 1 unit of PRBCs prior to transfer to [facility]. Hemoglobin has improved. 4. Acute on chronic kidney disease: Likely secondary to cardiorenal syndrome, we'll continue to monitor following diuresis. 5. DM2 - continue Lantus and sliding scale insulin  6. Attention - on Carvedilol. Controlled. 7. CAD, status post CABG and stents: Denies chest pain. Troponins negative. 8. Chronic low back pain: Pain management.   Code Status: Full Family Communication: none  Disposition Plan: TBD  Consultants:  Cardiology  Procedures:  2D echo  Study Conclusions  - Left ventricle: There is motion at the base. The images are severely limited. There is septal dyssynergy. Endocardium is seen on limited basis. I can not estimate LV function. The cavity size was mildly dilated. Wall thickness was increased in a pattern of mild LVH. - Left atrium: The atrium was moderately to severely dilated. - Right ventricle: Not able to assess RV function due to poor visualization. Not able to assess RV size due to poor visualization. - Impressions: A different technique will be needed to assess LV. Echo with contrast may help.   Antibiotics:  none  HPI/Subjective: - no complaints, feels well.   Objective: Filed Vitals:   09/03/12 1245 09/03/12 1417 09/03/12 2033 09/04/12 0413  BP: 94/60 104/60 100/54 115/55  Pulse:  80 67 74  Temp:  97.5 F (36.4 C) 98.3 F (36.8 C) 98.2 F (36.8 C)  TempSrc:  Oral Oral Oral  Resp:   18 20  Height:      Weight:    81.421 kg (179 lb 8 oz)  SpO2:  96%  94% 95%    Intake/Output Summary (Last 24 hours) at 09/04/12 0808 Last data filed at 09/04/12 0420  Gross per 24 hour  Intake    240 ml  Output    600 ml  Net   -360 ml   Filed Weights   09/02/12 0343 09/03/12 0416 09/04/12 0413  Weight: 80.3 kg (177 lb 0.5 oz) 81.239 kg (179 lb 1.6  oz) 81.421 kg (179 lb 8 oz)    Exam:   General:  NAD  Cardiovascular: regular rate and rhythm, without MRG.   Respiratory: good air movement, clear to auscultation throughout, no wheezing, ronchi or rales, decreased breath sounds in the right lower base   Abdomen: soft, not tender to palpation, positive bowel sounds  MSK: 1 + peripheral edema  Neuro: CN 2-12 grossly intact, MS 5/5 in all 4  Data Reviewed: Basic Metabolic Panel:  Recent Labs Lab 09/01/12 0430 09/02/12 0803 09/03/12 0522 09/04/12 0543  NA 134* 135 134* 132*  K 3.3* 3.5 3.3* 4.4  CL 93* 93* 92* 93*  CO2 30 31 32 26  GLUCOSE 167* 151* 172* 158*  BUN 54* 58* 67* 74*  CREATININE 2.38* 2.39* 2.64* 2.72*  CALCIUM 8.8 8.5 8.6 8.8   Liver Function Tests:  Recent Labs Lab 09/01/12 0430  AST 23  ALT 14  ALKPHOS 131*  BILITOT 1.4*  PROT 6.5  ALBUMIN 2.6*   CBC:  Recent Labs Lab 09/01/12 0430 09/02/12 0803 09/03/12 0522  WBC 8.4 7.0 6.9  HGB 9.6* 9.2* 9.0*  HCT 29.6* 27.8* 27.4*  MCV 87.3 86.9 87.0  PLT 236 242 252   Cardiac Enzymes:  Recent Labs Lab 09/01/12 0430 09/01/12 0933 09/01/12 1602  TROPONINI <0.30 <0.30 <0.30   BNP (last 3 results)  Recent Labs  09/01/12 0430  PROBNP 10584.0*   CBG:  Recent Labs Lab 09/03/12 0555 09/03/12 1137 09/03/12 1611 09/03/12 2107 09/04/12 0605  GLUCAP 163* 203* 169* 173* 158*   Studies: Dg Chest Right Decubitus  09/03/2012  *RADIOLOGY REPORT*  Clinical Data: Right pleural effusion.  CHEST - RIGHT DECUBITUS  Comparison: 09/01/2012  Findings: A right lateral decubitus view of the chest demonstrates no definite layering effusion. Right lateral basilar opacity is noted and could represent consolidation versus loculated pleural fluid.  IMPRESSION: No evidence of free flowing effusion.  Right lateral basilar opacity again identified - question consolidation versus loculated pleural fluid.   Original Report Authenticated By: Harmon Pier, M.D.      Scheduled Meds: . aspirin  81 mg Oral Daily  . carvedilol  12.5 mg Oral BID WC  . furosemide  40 mg Intravenous BID  . hydrALAZINE  12.5 mg Oral Q8H  . insulin aspart  0-5 Units Subcutaneous QHS  . insulin aspart  0-9 Units Subcutaneous TID WC  . insulin glargine  15 Units Subcutaneous QHS  . isosorbide mononitrate  30 mg Oral Daily  . multivitamin with minerals  1 tablet Oral Daily  . ranolazine  1,000 mg Oral BID  . senna  1 tablet Oral BID  . simvastatin  20 mg Oral QPM  . sodium chloride  3 mL Intravenous Q12H   Continuous Infusions: . milrinone 0.125 mcg/kg/min (09/03/12 1154)    Active Problems:   Anemia   Renal failure (ARF), acute on chronic   Pleural effusion, right   Diabetes type 2, uncontrolled   HTN (hypertension)   Hyperlipidemia   CAD (coronary artery disease), S/P CABG & stents   Systolic CHF, acute  on chronic   Chronic LBP  Pamella Pert, MD [facility] Pager [phone]. If 7 PM - 7 AM, please contact night-coverage at [email or web address], password [facility] 09/04/2012, 8:08 AM  LOS: 3 days

## 2012-09-05 LAB — PROTIME-INR
INR: 0.93 (ref 0.00–1.49)
Prothrombin Time: 12.4 seconds (ref 11.6–15.2)

## 2012-09-05 LAB — BASIC METABOLIC PANEL
CO2: 28 mEq/L (ref 19–32)
Chloride: 91 mEq/L — ABNORMAL LOW (ref 96–112)
GFR calc Af Amer: 27 mL/min — ABNORMAL LOW (ref >90)
Potassium: 4.1 mEq/L (ref 3.5–5.1)

## 2012-09-05 LAB — CBC
Hemoglobin: 7.8 g/dL — ABNORMAL LOW (ref 13.0–17.0)
Platelets: UNDETERMINED 10*3/uL (ref 150–400)
Platelets: 271 10*3/uL (ref 150–400)
RBC: 2.68 MIL/uL — ABNORMAL LOW (ref 4.22–5.81)
RBC: 2.73 MIL/uL — ABNORMAL LOW (ref 4.22–5.81)
RDW: 16.4 % — ABNORMAL HIGH (ref 11.5–15.5)
WBC: 5.2 10*3/uL (ref 4.0–10.5)
WBC: 6.2 10*3/uL (ref 4.0–10.5)

## 2012-09-05 LAB — GLUCOSE, CAPILLARY
Glucose-Capillary: 138 mg/dL — ABNORMAL HIGH (ref 70–99)
Glucose-Capillary: 167 mg/dL — ABNORMAL HIGH (ref 70–99)

## 2012-09-05 MED ORDER — PANTOPRAZOLE SODIUM 40 MG PO TBEC
40.0000 mg | DELAYED_RELEASE_TABLET | Freq: Two times a day (BID) | ORAL | Status: DC
  Administered 2012-09-05 – 2012-09-10 (×9): 40 mg via ORAL
  Filled 2012-09-05 (×9): qty 1

## 2012-09-05 MED ORDER — CARVEDILOL 3.125 MG PO TABS
3.1250 mg | ORAL_TABLET | Freq: Two times a day (BID) | ORAL | Status: DC
  Administered 2012-09-05 – 2012-09-10 (×10): 3.125 mg via ORAL
  Filled 2012-09-05 (×12): qty 1

## 2012-09-05 MED ORDER — SODIUM CHLORIDE 0.9 % IJ SOLN: 3.0000 mL | INTRAMUSCULAR | Status: DC | PRN

## 2012-09-05 MED ORDER — SODIUM CHLORIDE 0.9 % IJ SOLN
3.0000 mL | Freq: Two times a day (BID) | INTRAMUSCULAR | Status: DC
  Administered 2012-09-05: 3 mL via INTRAVENOUS

## 2012-09-05 MED ORDER — FUROSEMIDE 10 MG/ML IJ SOLN
60.0000 mg | Freq: Three times a day (TID) | INTRAMUSCULAR | Status: DC
  Administered 2012-09-05 – 2012-09-06 (×3): 60 mg via INTRAVENOUS
  Filled 2012-09-05 (×6): qty 6

## 2012-09-05 MED ORDER — SUCRALFATE 1 GM/10ML PO SUSP
1.0000 g | Freq: Three times a day (TID) | ORAL | Status: DC
  Administered 2012-09-05 – 2012-09-10 (×14): 1 g via ORAL
  Filled 2012-09-05 (×23): qty 10

## 2012-09-05 MED ORDER — SODIUM CHLORIDE 0.9 % IV SOLN: 250.0000 mL | INTRAVENOUS | Status: DC | PRN

## 2012-09-05 MED ORDER — RANOLAZINE ER 500 MG PO TB12
500.0000 mg | ORAL_TABLET | Freq: Two times a day (BID) | ORAL | Status: DC
  Administered 2012-09-05: 500 mg via ORAL
  Filled 2012-09-05 (×3): qty 1

## 2012-09-05 NOTE — Consult Note (Signed)
Patient seen, examined, and I agree with the above documentation, including the assessment and plan. 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 Othelia Pulling in December 2013 -- their plan was to repeat as needed and follow hemoglobin Agree with reinstitution of PPI and Carafate for now. I recommended EGD with APC if necessary for GAVE prior to discharge, but will await cardiology clearance for this test (would prefer further diuresis and will await right heart cath results) Question cirrhosis by CT -- we'll await further diuresis and recommend ultrasound abdomen to evaluate liver; chronic heart failure and passive congestion could contribute to liver dysfunction. Check hepatitis B and C. Antibodies, INR is normal. No hx of varices on prior EGDs

## 2012-09-05 NOTE — Progress Notes (Signed)
Physical Therapy Treatment Patient Details Name: [name] MRN: [id number] DOB: 28-Oct-1945 Today's Date: 09/05/2012 Time: 7829-5621 PT Time Calculation (min): 16 min  PT Assessment / Plan / Recommendation Comments on Treatment Session  pt feeling puny from low HgB at 7.9.  With encouragement he managed to get into the hall.  No over SOB only fatigue.    Follow Up Recommendations  Home health PT;Supervision/Assistance - 24 hour     Does the patient have the potential to tolerate intense rehabilitation     Barriers to Discharge        Equipment Recommendations  None recommended by PT    Recommendations for Other Services    Frequency Min 3X/week   Plan Discharge plan remains appropriate;Frequency remains appropriate    Precautions / Restrictions Precautions Precautions: Fall Restrictions Weight Bearing Restrictions: No   Pertinent Vitals/Pain     Mobility  Bed Mobility Bed Mobility: Supine to Sit;Sitting - Scoot to Edge of Bed;Sit to Supine Supine to Sit: 5: Supervision Sitting - Scoot to Edge of Bed: 5: Supervision Sit to Supine: 5: Supervision Details for Bed Mobility Assistance: Some struggle, but generally safe Transfers Transfers: Sit to Stand;Stand to Sit Sit to Stand: From bed;4: Min guard Stand to Sit: 4: Min guard;To bed Details for Transfer Assistance: reinforced safety/hand placement Ambulation/Gait Ambulation/Gait Assistance: 4: Min assist Ambulation Distance (Feet): 90 Feet Assistive device: Rolling walker Ambulation/Gait Assistance Details: generally steady, but not maneuvering RW well.  Tending to veer constantly off to the Left and needed min assist to keep RW heading straight. Gait Pattern: Step-through pattern;Decreased step length - right;Decreased step length - left;Decreased stride length;Shuffle Gait velocity: decreased Stairs: No Wheelchair Mobility Wheelchair Mobility: No    Exercises     PT Diagnosis:    PT Problem List:   PT  Treatment Interventions:     PT Goals Acute Rehab PT Goals Time For Goal Achievement: 09/08/12 Potential to Achieve Goals: Good Pt will go Supine/Side to Sit: Independently PT Goal: Supine/Side to Sit - Progress: Progressing toward goal Pt will Sit at Edge of Bed: Independently;3-5 min;with no upper extremity support PT Goal: Sit at Edge Of Bed - Progress: Progressing toward goal Pt will go Sit to Stand: Independently PT Goal: Sit to Stand - Progress: Progressing toward goal Pt will Transfer Bed to Chair/Chair to Bed: Independently PT Transfer Goal: Bed to Chair/Chair to Bed - Progress: Progressing toward goal Pt will Ambulate: 51 - 150 feet;with modified independence;with least restrictive assistive device PT Goal: Ambulate - Progress: Progressing toward goal  Visit Information  Last PT Received On: 09/05/12 Assistance Needed: +1    Subjective Data  Subjective: I'm sorry, I just feel washed out.   Cognition  Cognition Overall Cognitive Status: Appears within functional limits for tasks assessed/performed Arousal/Alertness: Lethargic Behavior During Session: Lethargic    Balance     End of Session PT - End of Session Activity Tolerance: Patient limited by fatigue Patient left: in bed;with call bell/phone within reach Nurse Communication: Mobility status   GP     Nyra Anspaugh, Eliseo Gum 09/05/2012, 3:07 PM 09/05/2012  [place] Bing, PT [phone] (pager)

## 2012-09-05 NOTE — Consult Note (Signed)
[facility] Consult: 12:44 PM 09/05/2012   Referring Provider: Dr Shirlee Latch Primary Care Physician:  Dina Rich, MD Primary Gastroenterologist:  At [facility]:  Oliver Pila N.P . Dr Boyd Kerbs.  Dr Othelia Pulling.   Reason for Consultation:  Anemia.  FOB positive stool  HPI: [name] is a 67 y.o. male.  Has IDDMHx diastolic and systolic CHF, s/p CABG and subsequent PCI, worsening renal failure treated with addition of Milrinone.  Had EGD and capsule endoscopy at [facility] in 8 - 05/2012.Hospitalized with transfusion requiring anemia (5 units per pt report) at least once. On EGD had GAVE treated with APC.  I do not find the colonoscopy report, though pt says it has been done.   Note that CT chest of 06/2012 showed Cirrhotic liver and cholelithiasis.   Admitted 3/14 with increasing LE edema,  Abdominal distention is chronic and improved c/w several months ago, DOE is worse. Hgb was 7.0 in [facility] ED, got one unit blood on route to [facility] .  No transfusions since.     Hgb of 10/2011:  9.8 with MCV 100             3/14 in [place]:  7.0.  Transfused one unit.              09/01/12:  9.6, MCV 86 post transfusion.               09/05/12:  7.6,  84.  FOB + on 3/14.   Takes iron daily, stools usually dark.  No recent melena or BPR.  No anorexia, no dysphagia, no nausea, no heartburn.  Takes Protonix and Carafate at home.  No NSAIDs.  No ETOH, no cigarettes or chew.  No family hx colon cancer or liver disease. He is unaware of any problems with his liver.     Past Medical History  Diagnosis Date  . Coronary artery disease   . Diabetes mellitus   . Hypercholesteremia   . Shortness of breath   . CHF (congestive heart failure)   . HTN (hypertension)   . GERD (gastroesophageal reflux disease)         Compreesion fracture        Cirrhotic liver on CT chest in 06/2012.             Past Surgical History  Procedure Laterality Date  . Coronary artery bypass  graft twice in 1993 and 1999.  cardicac stent around 2006.     . Carotid endarterectomy   2002    . Cardiac catheterization    . Shoulder surgery         Hemorrhoid surgery       Thoracentesis 06/2011  At [facility].        Bronchoscopy 07/2011 at [facility]  Prior to Admission medications   Medication Sig Start Date End Date Taking? Authorizing Provider  aspirin 81 MG tablet Take 81 mg by mouth daily.   Yes Historical Provider, MD  carvedilol (COREG) 3.125 MG tablet Take 3.125 mg by mouth 2 (two) times daily with a meal.   Yes Historical Provider, MD  Cholecalciferol (VITAMIN D PO) Take 1 tablet by mouth daily.   Yes Historical Provider, MD  Cyanocobalamin (VITAMIN B 12 PO) Take 1 tablet by mouth daily.   Yes Historical Provider, MD  furosemide (LASIX) 20 MG tablet Take 20 mg by mouth 3 (three) times daily.   Yes Historical Provider, MD  insulin glargine (LANTUS) 100 UNIT/ML injection Inject 18 Units into the skin daily  after breakfast.   Yes Historical Provider, MD  insulin lispro (HUMALOG) 100 UNIT/ML injection Inject 3-10 Units into the skin 3 (three) times daily before meals. Based on sliding scale   Yes Historical Provider, MD  Multiple Vitamin (MULITIVITAMIN WITH MINERALS) TABS Take 1 tablet by mouth daily.   Yes Historical Provider, MD  oxyCODONE (OXY IR/ROXICODONE) 5 MG immediate release tablet Take 5 mg by mouth every 4 (four) hours as needed for pain.   Yes Historical Provider, MD  ranolazine (RANEXA) 500 MG 12 hr tablet Take 1,000 mg by mouth 2 (two) times daily.   Yes Historical Provider, MD  simvastatin (ZOCOR) 20 MG tablet Take 20 mg by mouth every evening.   Yes Historical Provider, MD  Protonix 40 mg                         Twicedaily Carafate                                     4 x daily.   Scheduled Meds: . aspirin  81 mg Oral Daily  . carvedilol  3.125 mg Oral BID WC  . furosemide  60 mg Intravenous Q8H  . insulin aspart  0-5 Units Subcutaneous QHS  . insulin aspart  0-9  Units Subcutaneous TID WC  . insulin glargine  15 Units Subcutaneous QHS  . multivitamin with minerals  1 tablet Oral Daily  . ranolazine  500 mg Oral BID  . senna  1 tablet Oral BID  . simvastatin  20 mg Oral QPM  . sodium chloride  3 mL Intravenous Q12H  . sodium chloride  3 mL Intravenous Q12H   Infusions: . milrinone 0.25 mcg/kg/min (09/05/12 1047)   PRN Meds: sodium chloride, albuterol, ondansetron (ZOFRAN) IV, ondansetron, oxyCODONE, polyethylene glycol, sodium chloride   Allergies as of 08/31/2012 - Review Complete 08/31/2012  Allergen Reaction Noted  . Morphine and related Other (See Comments) 11/02/2011    Family History  Problem Relation Age of Onset  . Heart disease Father     History   Social History  . Marital Status: Married    Spouse Name: N/A    Number of Children: N/A  . Years of Education: N/A   Occupational History  . Not on file.   Social History Main Topics  . Smoking status: Never Smoker   . Smokeless tobacco: Not on file  . Alcohol Use: No  . Drug Use: No  . Sexually Active: Not on file   Other Topics Concern  . Not on file   Social History Narrative  . No narrative on file    REVIEW OF SYSTEMS: Constitutional:  Fells better but tired.   ENT:  No nose bleeds Pulm:  SOB improved.  No cough CV:  stable 2 pillow orthopnea. Increasing leg swelling.  GU:  No oliguria or blood in urine GI:  Per HPI Heme:  Per HPI.    Transfusions:  One unit PRBCs on 3/14.  Neuro:  No seizure, no double vision.  No seizures Derm:  No rash, no jaundice, no itching, no sores Endocrine:  No sweats or chills Immunization:  Up to date flu shot Travel:  None.   PHYSICAL EXAM: Vital signs in last 24 hours: Temp:  [98.3 F (36.8 C)-98.8 F (37.1 C)] 98.8 F (37.1 C) (03/18 0504) Pulse Rate:  [66-74] 66 (03/18 0935) Resp:  [  18-19] 18 (03/18 0504) BP: (92-123)/(39-71) 103/39 mmHg (03/18 0935) SpO2:  [94 %-95 %] 94 % (03/18 0504) Weight:  [81.874 kg  (180 lb 8 oz)] 81.874 kg (180 lb 8 oz) (03/18 3086)  General: elderly man looks in poor health.  comfortable Head:  No swelling  Eyes:  No icterus or pallor Ears:  Not HOH  Nose:  No congestion or discharge Mouth:  Moist, clear oral MM Neck:  No mass, no bruits Lungs:  Clear but reduced.  No cough or dyspnea Heart: 2/6 SEM.  RRR Abdomen:  Soft, bulging flanks, no fluid wave,  Not tender.  No HSM.  No bruits. Not distended but somewhat protuberant.   Rectal: not repeated.  FOB + in lab   Musc/Skeltl: no joint swelling Extremities:  No CCE  Neurologic:  Pleasant, oriented x3.  No asterixis or tremor.  Moves all 4 limbs.  Skin:  A few telangectasia of upper chest Tattoos:  none Nodes:  No inguinal adenopathy   Psych:  Pleasant, not agitated or depressed.   Intake/Output from previous day: 03/17 0701 - 03/18 0700 In: 1082.6 [P.O.:960; I.V.:122.6] Out: 1275 [Urine:1275] Intake/Output this shift: Total I/O In: 153.3 [P.O.:120; I.V.:13.3; Other:20] Out: 575 [Urine:575]  LAB RESULTS:  Recent Labs  09/03/12 0522 09/05/12 0550  WBC 6.9 5.2  HGB 9.0* 7.6*  HCT 27.4* 22.7*  PLT 252 PLATELET CLUMPS NOTED ON SMEAR, UNABLE TO ESTIMATE   BMET Lab Results  Component Value Date   NA 132* 09/05/2012   NA 132* 09/04/2012   NA 134* 09/03/2012   K 4.1 09/05/2012   K 4.4 09/04/2012   K 3.3* 09/03/2012   CL 91* 09/05/2012   CL 93* 09/04/2012   CL 92* 09/03/2012   CO2 28 09/05/2012   CO2 26 09/04/2012   CO2 32 09/03/2012   GLUCOSE 132* 09/05/2012   GLUCOSE 158* 09/04/2012   GLUCOSE 172* 09/03/2012   BUN 76* 09/05/2012   BUN 74* 09/04/2012   BUN 67* 09/03/2012   CREATININE 2.71* 09/05/2012   CREATININE 2.72* 09/04/2012   CREATININE 2.64* 09/03/2012   CALCIUM 8.7 09/05/2012   CALCIUM 8.8 09/04/2012   CALCIUM 8.6 09/03/2012   LFT No results found for this basename: PROT, ALBUMIN, AST, ALT, ALKPHOS, BILITOT, BILIDIR, IBILI,  in the last 72 hours PT/INR Lab Results  Component Value Date   INR  0.91 09/01/2012   INR 1.0 01/11/2007    RADIOLOGY STUDIES: Mr Card Morphology W/o Cm 09/04/2012    IMPRESSION: Normal LV size with moderate systolic dysfunction, EF 37%.  Wall motion abnormalities as noted above.  Technically difficult study due to poor breath-holding.   Original Report Authenticated By: Marca Ancona     ENDOSCOPIC STUDIES:  EGD  05/22/12  For melena by Dr Othelia Pulling.  Impressions: Vascular ectasia s/p APC  Staple and suture in proximal body  Recommendations: Monitor for any recurrent signs/symptoms of bleeding with melena or hemoglobin drop  Will repeat EGD if signs/symptoms of bleeding return. Otherwise recommend monitoring   EGD 04/10/12  Dr Boyd Kerbs Impressions:  1. 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  2. Treatment of GAVE with APC (1.0 L/min, 40 watts)  Recommendations:  1. Continue PPI  2. Follow up with Oliver Pila in GI clinic  3. Repeat EGD in 2 weeks for APC    EGD  Sep 2013 Antral gastritis. H pylori present and was treated with ABX.   Capsule Endoscopy 01/2012  " There is  concern about gastric antral vascular ectasia.The gastritis is more significant than any small intestinal findings. There are mild, non-specific subtle abnormlities throughout the duodenum and small intestine. There is focal erythema, and there are pinpoint erosions,seen in all areas of the small bowel. Nothing in the small intestine appears to be endoscopically treatable."   IMPRESSION: *  Normocytic anemia with FOB + stool in pt with known GAVE syndrome.  S/p one unit blood.  EGD with ablation on several occasions, most recent was December 2013.  Capsule endoscopy in 01/2012 for transfusion requiring anemia.  Pt says he had colonoscopy but it it not in the recent [facility] records.  Suspect his renal failure is causative along with possible GAVE related recurrent blood loss. *  Cirrhosis by chest CT in 06/2012.  No varices on EGDs.  *  CAD  PLAN: *  ?  Repeat EGD, will d/w Dr Rhea Belton *  Add back Protonix and Carafate, these were not included on  outpt med list and not continued.    LOS: 4 days   Jennye Moccasin  09/05/2012, 12:44 PM Pager: [phone]

## 2012-09-05 NOTE — Progress Notes (Signed)
[facility] PROGRESS NOTE  [name] XBJ:[id number] DOB: 31-Oct-1945 DOA: 09/01/2012 PCP: Dina Rich, MD  Brief Narrative: 67 y.o. male with history of type II DM, HTN, hyperlipidemia, CAD status post CABG and stents, chronic kidney disease with baseline creatinine 1.9, chronic systolic CHF (last EF said to be 40%) and GERD was transferred from [facility] ED to [facility] for further evaluation and management of worsening dyspnea and leg swelling. Patient gives a 2 week history of gradually worsening bilateral lower extremity swelling, abdominal distention and DOE. He saw his primary cardiologist last week and was placed on high dose Lasix for a few days. Patient's symptoms initially improved but in the last 3 or 4 days, he is noted worsening DOE when he walks a few steps. He denies dyspnea at rest, chest pain, palpitations. He normally uses 2 pillows which has not changed. He has noticed worsening leg swelling. He presented to the [facility] ED where hemoglobin was 7 g/dL (patient states that recently his hemoglobin has been 9.1 and 8.4 g/dL) and creatinine of 2.5. His case was discussed with the hospitalist and cardiologist there who advised transfer to tertiary hospital. Patient denies any bleeding, vomiting or melena. Patient was transfused 1 unit of PRBCs on route to [facility]. His O2 sats at [facility] was 90% on room air.  Assessment/Plan: 1. Acute on chronic systolic CHF: Admited to telemetry. 2-D echocardiogram done 3/14, poor windows. Cardiology consulted. - MRI shows EF 37% - His renal function is worsening. Appreciate cardiology input, patient was started on milrinone 09/03/2012. Net negative 2L since admission, however daily weights stable (?) - plan for RHC tomorrow  2. Right pleural effusion: Possibly transudate of from decompensated CHF. No symptoms or signs suggestive of pneumonia.Lateral decubitus shows no evidence of free flowing effusion,    3. Anemia: Possibly of chronic disease and chronic kidney disease. GI to evaluate today.   4. Acute on chronic kidney disease: Likely secondary to cardiorenal syndrome, we'll continue to monitor following diuresis.  5. DM2 - continue Lantus and sliding scale insulin   6. Attention - on Carvedilol. Controlled.  7. CAD, status post CABG and stents: Denies chest pain. Troponins negative.  8. Chronic low back pain: Pain management.   Code Status: Full Family Communication: none  Disposition Plan: TBD  Consultants:  Cardiology  GI  Procedures:  2D echo  Study Conclusions - Left ventricle: There is motion at the base. The images are severely limited. There is septal dyssynergy. Endocardium is seen on limited basis. I can not estimate LV function. The cavity size was mildly dilated. Wall thickness was increased in a pattern of mild LVH. - Left atrium: The atrium was moderately to severely dilated. - Right ventricle: Not able to assess RV function due to poor visualization. Not able to assess RV size due to poor visualization. - Impressions: A different technique will be needed to assess LV. Echo with contrast may help.   Antibiotics:  none  HPI/Subjective: - no complaints, feels well.   Objective: Filed Vitals:   09/05/12 0513 09/05/12 0607 09/05/12 0935 09/05/12 1253  BP: 106/57  103/39 108/56  Pulse: 73  66 79  Temp:    98.5 F (36.9 C)  TempSrc:    Oral  Resp:    18  Height:      Weight:  81.874 kg (180 lb 8 oz)    SpO2:    94%    Intake/Output Summary (Last 24 hours) at 09/05/12 1333 Last  data filed at 09/05/12 1254  Gross per 24 hour  Intake 875.85 ml  Output   1575 ml  Net -699.15 ml   Filed Weights   09/03/12 0416 09/04/12 0413 09/05/12 0607  Weight: 81.239 kg (179 lb 1.6 oz) 81.421 kg (179 lb 8 oz) 81.874 kg (180 lb 8 oz)    Exam:   General:  NAD  Cardiovascular: regular rate and rhythm, without MRG.   Respiratory: good air  movement, clear to auscultation throughout, no wheezing, ronchi or rales, decreased breath sounds in the right lower base, improved today compared to yesterday  Abdomen: soft, not tender to palpation, positive bowel sounds  MSK: 1 + peripheral edema  Neuro: CN 2-12 grossly intact, MS 5/5 in all 4  Data Reviewed: Basic Metabolic Panel:  Recent Labs Lab 09/01/12 0430 09/02/12 0803 09/03/12 0522 09/04/12 0543 09/05/12 0550  NA 134* 135 134* 132* 132*  K 3.3* 3.5 3.3* 4.4 4.1  CL 93* 93* 92* 93* 91*  CO2 30 31 32 26 28  GLUCOSE 167* 151* 172* 158* 132*  BUN 54* 58* 67* 74* 76*  CREATININE 2.38* 2.39* 2.64* 2.72* 2.71*  CALCIUM 8.8 8.5 8.6 8.8 8.7   Liver Function Tests:  Recent Labs Lab 09/01/12 0430  AST 23  ALT 14  ALKPHOS 131*  BILITOT 1.4*  PROT 6.5  ALBUMIN 2.6*   CBC:  Recent Labs Lab 09/01/12 0430 09/02/12 0803 09/03/12 0522 09/05/12 0550  WBC 8.4 7.0 6.9 5.2  HGB 9.6* 9.2* 9.0* 7.6*  HCT 29.6* 27.8* 27.4* 22.7*  MCV 87.3 86.9 87.0 84.7  PLT 236 242 252 PLATELET CLUMPS NOTED ON SMEAR, UNABLE TO ESTIMATE   Cardiac Enzymes:  Recent Labs Lab 09/01/12 0430 09/01/12 0933 09/01/12 1602  TROPONINI <0.30 <0.30 <0.30   BNP (last 3 results)  Recent Labs  09/01/12 0430  PROBNP 10584.0*   CBG:  Recent Labs Lab 09/04/12 1104 09/04/12 1610 09/04/12 2052 09/05/12 0607 09/05/12 1059  GLUCAP 115* 151* 91 138* 167*   Studies: Mr Card Morphology W/o Cm  09/04/2012  *RADIOLOGY REPORT*  Clinical Data: Cardiomyopathy, unable to assess LV function by echo.  MR CARDIA MORPHOLOGY WITHOUT CONTRAST  GE 1.5 T magnet with dedicated cardiac coil.  FIESTA sequences for function and morphology.  Free-breathing sequences were used.  No contrast was used due to renal dysfunction.  EF was calculated at a dedicated workstation.  Contrast:  Comparison: None.  Findings: Difficult study as patient had a hard time holding his breath.  Free-breathing sequences were used.   Normal left ventricular size and wall thickness.  The mid to apical anterior, mid to apical anteroseptal, and mid to apical inferior walls appeared moderately to severely hypokinetic. EF was calculated to be 37%.  Normal right ventricular size and systolic function. Moderate left atrial enlargement.  Mild to moderate right atrial enlargement.  There appeared to be mild mitral regurgitation though flow sequences to quantify were not done.  Trileaflet aortic valve without significant stenosis.  Measurements:  LV EDV 193 mL  LV SV 71 mL  LV EF 37%  IMPRESSION: Normal LV size with moderate systolic dysfunction, EF 37%.  Wall motion abnormalities as noted above.  Technically difficult study due to poor breath-holding.   Original Report Authenticated By: Marca Ancona     Scheduled Meds: . aspirin  81 mg Oral Daily  . carvedilol  3.125 mg Oral BID WC  . furosemide  60 mg Intravenous Q8H  . insulin aspart  0-5 Units  Subcutaneous QHS  . insulin aspart  0-9 Units Subcutaneous TID WC  . insulin glargine  15 Units Subcutaneous QHS  . multivitamin with minerals  1 tablet Oral Daily  . ranolazine  500 mg Oral BID  . senna  1 tablet Oral BID  . simvastatin  20 mg Oral QPM  . sodium chloride  3 mL Intravenous Q12H  . sodium chloride  3 mL Intravenous Q12H   Continuous Infusions: . milrinone 0.25 mcg/kg/min (09/05/12 1047)    Active Problems:   Anemia   Renal failure (ARF), acute on chronic   Pleural effusion, right   Diabetes type 2, uncontrolled   HTN (hypertension)   Hyperlipidemia   CAD (coronary artery disease), S/P CABG & stents   Systolic CHF, acute on chronic   Chronic LBP  Pamella Pert, MD [facility] Pager [phone]. If 7 PM - 7 AM, please contact night-coverage at [email or web address], password [facility] 09/05/2012, 1:33 PM  LOS: 4 days

## 2012-09-05 NOTE — Progress Notes (Signed)
Advanced Heart Failure Rounding Note   Subjective:    67 yo with history of CAD s/p CABG and subsequent PCI, presumed diastolic CHF, and CKD presented with dyspnea/CHF exacerbation. Patient has been seen by Dr. Excell Seltzer in the past but currently seems to be getting most of his care down in [place].   3/17: cMRI showed LVEF 37% with mid to apical anterior, mid to apical anteroseptal and mid to apical inferior walls with mod to severe hypokinesis.  Normal RV.    Milrinone added 3/16 for worsening renal function.  Cr 2.38>2.64>2.72>2.71  Says he is feeling better.  Wt unchanged from admit.  No orthopnea/PND.  +lower extremity edema.  Objective:     Vital Signs:   Temp:  [98.3 F (36.8 C)-98.8 F (37.1 C)] 98.8 F (37.1 C) (03/18 0504) Pulse Rate:  [66-74] 66 (03/18 0935) Resp:  [18-19] 18 (03/18 0504) BP: (92-123)/(39-71) 103/39 mmHg (03/18 0935) SpO2:  [94 %-95 %] 94 % (03/18 0504) Weight:  [180 lb 8 oz (81.874 kg)] 180 lb 8 oz (81.874 kg) (03/18 0607) Last BM Date: 09/02/12  Weight change: Filed Weights   09/03/12 0416 09/04/12 0413 09/05/12 0607  Weight: 179 lb 1.6 oz (81.239 kg) 179 lb 8 oz (81.421 kg) 180 lb 8 oz (81.874 kg)    Intake/Output:   Intake/Output Summary (Last 24 hours) at 09/05/12 1046 Last data filed at 09/05/12 0941  Gross per 24 hour  Intake 1235.85 ml  Output   1275 ml  Net -39.15 ml     Physical Exam: General:  Chronically ill appearing. No resp difficulty HEENT: normal Neck: supple. JVP jaw. Carotids 2+ bilat; no bruits. No lymphadenopathy or thryomegaly appreciated. Cor: PMI nondisplaced. Regular rate & rhythm. 2/6 systolic murmur. Lungs: clear Abdomen: soft, nontender, mild distention. No hepatosplenomegaly. No bruits or masses. Good bowel sounds. Extremities: no cyanosis, clubbing, rash, 2+ Rt lower extremity tr on Lt lower extremity edema Neuro: alert & orientedx3, cranial nerves grossly intact. moves all 4 extremities w/o difficulty. Affect  pleasant  Telemetry: SR 70-80s  Labs: Basic Metabolic Panel:  Recent Labs Lab 09/01/12 0430 09/02/12 0803 09/03/12 0522 09/04/12 0543 09/05/12 0550  NA 134* 135 134* 132* 132*  K 3.3* 3.5 3.3* 4.4 4.1  CL 93* 93* 92* 93* 91*  CO2 30 31 32 26 28  GLUCOSE 167* 151* 172* 158* 132*  BUN 54* 58* 67* 74* 76*  CREATININE 2.38* 2.39* 2.64* 2.72* 2.71*  CALCIUM 8.8 8.5 8.6 8.8 8.7    Liver Function Tests:  Recent Labs Lab 09/01/12 0430  AST 23  ALT 14  ALKPHOS 131*  BILITOT 1.4*  PROT 6.5  ALBUMIN 2.6*   No results found for this basename: LIPASE, AMYLASE,  in the last 168 hours No results found for this basename: AMMONIA,  in the last 168 hours  CBC:  Recent Labs Lab 09/01/12 0430 09/02/12 0803 09/03/12 0522 09/05/12 0550  WBC 8.4 7.0 6.9 5.2  HGB 9.6* 9.2* 9.0* 7.6*  HCT 29.6* 27.8* 27.4* 22.7*  MCV 87.3 86.9 87.0 84.7  PLT 236 242 252 PLATELET CLUMPS NOTED ON SMEAR, UNABLE TO ESTIMATE    Cardiac Enzymes:  Recent Labs Lab 09/01/12 0430 09/01/12 0933 09/01/12 1602  TROPONINI <0.30 <0.30 <0.30    BNP: BNP (last 3 results)  Recent Labs  09/01/12 0430  PROBNP 10584.0*      Imaging: Dg Chest Right Decubitus  09/03/2012  *RADIOLOGY REPORT*  Clinical Data: Right pleural effusion.  CHEST - RIGHT DECUBITUS  Comparison: 09/01/2012  Findings: A right lateral decubitus view of the chest demonstrates no definite layering effusion. Right lateral basilar opacity is noted and could represent consolidation versus loculated pleural fluid.  IMPRESSION: No evidence of free flowing effusion.  Right lateral basilar opacity again identified - question consolidation versus loculated pleural fluid.   Original Report Authenticated By: Harmon Pier, M.D.    Mr Card Morphology W/o Cm  09/04/2012  *RADIOLOGY REPORT*  Clinical Data: Cardiomyopathy, unable to assess LV function by echo.  MR CARDIA MORPHOLOGY WITHOUT CONTRAST  GE 1.5 T magnet with dedicated cardiac coil.   FIESTA sequences for function and morphology.  Free-breathing sequences were used.  No contrast was used due to renal dysfunction.  EF was calculated at a dedicated workstation.  Contrast:  Comparison: None.  Findings: Difficult study as patient had a hard time holding his breath.  Free-breathing sequences were used.  Normal left ventricular size and wall thickness.  The mid to apical anterior, mid to apical anteroseptal, and mid to apical inferior walls appeared moderately to severely hypokinetic. EF was calculated to be 37%.  Normal right ventricular size and systolic function. Moderate left atrial enlargement.  Mild to moderate right atrial enlargement.  There appeared to be mild mitral regurgitation though flow sequences to quantify were not done.  Trileaflet aortic valve without significant stenosis.  Measurements:  LV EDV 193 mL  LV SV 71 mL  LV EF 37%  IMPRESSION: Normal LV size with moderate systolic dysfunction, EF 37%.  Wall motion abnormalities as noted above.  Technically difficult study due to poor breath-holding.   Original Report Authenticated By: Marca Ancona      Medications:     Scheduled Medications: . aspirin  81 mg Oral Daily  . carvedilol  6.25 mg Oral BID WC  . furosemide  40 mg Intravenous Daily  . hydrALAZINE  12.5 mg Oral Q8H  . insulin aspart  0-5 Units Subcutaneous QHS  . insulin aspart  0-9 Units Subcutaneous TID WC  . insulin glargine  15 Units Subcutaneous QHS  . isosorbide mononitrate  30 mg Oral Daily  . multivitamin with minerals  1 tablet Oral Daily  . ranolazine  1,000 mg Oral BID  . senna  1 tablet Oral BID  . simvastatin  20 mg Oral QPM  . sodium chloride  3 mL Intravenous Q12H    Infusions: . milrinone 0.125 mcg/kg/min (09/04/12 1308)    PRN Medications: albuterol, ondansetron (ZOFRAN) IV, ondansetron, oxyCODONE, polyethylene glycol   Assessment/Plan:   1. 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   cMRI showed EF 37%.   - Cardiorenal syndrome continues to get worse despite addition of low dose milrinone.  Increase milrinone 0.25.  Will proceed with RHC tomorrow to further access hemodynamics. - Will increase Lasix to 60 mg IV TID.    - Continue Coreg but cut back to 3.125 BID, stop hydralazine/imdur with soft BP.  No ACEI with renal dysfunction.  2. CAD: S/p CABG and subsequent PCI. No chest pain, negative cardiac enzymes. Continue ASA and statin. He is also on ranolazine.  3. CKD: Unsure baseline creatinine although it was 1.5 in 10/2011.  Creatinine worsened initially, now stable.  Suspect cardiorenal syndrome.  As above will proceed with RHC tomorrow.   4. Anemia: ? Renal disease/chronic disease but FOBT+. Hgb down to 7.6 (9.6 on admit).  Will involve GI.    5.  Rhythm: Probably NSR with low voltage P waves. Similar to prior ECG last year.   Length of Stay: 4 Marca Ancona 09/05/2012 10:48 AM

## 2012-09-06 ENCOUNTER — Inpatient Hospital Stay (HOSPITAL_COMMUNITY): Payer: PRIVATE HEALTH INSURANCE

## 2012-09-06 ENCOUNTER — Encounter (HOSPITAL_COMMUNITY)
Admission: EM | Disposition: A | Discharge status: Home / Self Care | Payer: Self-pay | Source: Other Acute Inpatient Hospital | Attending: Internal Medicine

## 2012-09-06 DIAGNOSIS — I509 Heart failure, unspecified: Secondary | ICD-10-CM

## 2012-09-06 HISTORY — PX: RIGHT HEART CATHETERIZATION: SHX5447

## 2012-09-06 LAB — CBC
Hemoglobin: 8.4 g/dL — ABNORMAL LOW (ref 13.0–17.0)
MCH: 28.4 pg (ref 26.0–34.0)
MCHC: 33.3 g/dL (ref 30.0–36.0)
MCHC: 32.4 g/dL (ref 30.0–36.0)
MCV: 85.3 fL (ref 78.0–100.0)
Platelets: 250 10*3/uL (ref 150–400)
RDW: 15.9 % — ABNORMAL HIGH (ref 11.5–15.5)
RDW: 15.8 % — ABNORMAL HIGH (ref 11.5–15.5)
WBC: 5.5 10*3/uL (ref 4.0–10.5)

## 2012-09-06 LAB — POCT I-STAT 3, VENOUS BLOOD GAS (G3P V)
O2 Saturation: 65 %
TCO2: 32 mmol/L (ref 0–100)
pH, Ven: 7.39 — ABNORMAL HIGH (ref 7.250–7.300)

## 2012-09-06 LAB — BASIC METABOLIC PANEL
CO2: 31 mEq/L (ref 19–32)
Calcium: 8.8 mg/dL (ref 8.4–10.5)
Chloride: 92 mEq/L — ABNORMAL LOW (ref 96–112)
Creatinine, Ser: 2.75 mg/dL — ABNORMAL HIGH (ref 0.50–1.35)
GFR calc Af Amer: 26 mL/min — ABNORMAL LOW (ref >90)
GFR calc non Af Amer: 23 mL/min — ABNORMAL LOW (ref >90)
GFR calc non Af Amer: 25 mL/min — ABNORMAL LOW (ref >90)
Glucose, Bld: 197 mg/dL — ABNORMAL HIGH (ref 70–99)
Potassium: 3.4 mEq/L — ABNORMAL LOW (ref 3.5–5.1)
Sodium: 133 mEq/L — ABNORMAL LOW (ref 135–145)
Sodium: 132 mEq/L — ABNORMAL LOW (ref 135–145)

## 2012-09-06 LAB — CREATININE, SERUM
Creatinine, Ser: 2.52 mg/dL — ABNORMAL HIGH (ref 0.50–1.35)
GFR calc non Af Amer: 25 mL/min — ABNORMAL LOW (ref >90)

## 2012-09-06 LAB — GLUCOSE, CAPILLARY
Glucose-Capillary: 149 mg/dL — ABNORMAL HIGH (ref 70–99)
Glucose-Capillary: 174 mg/dL — ABNORMAL HIGH (ref 70–99)
Glucose-Capillary: 165 mg/dL — ABNORMAL HIGH (ref 70–99)

## 2012-09-06 LAB — POCT I-STAT 3, ART BLOOD GAS (G3+): pH, Arterial: 7.44 (ref 7.350–7.450)

## 2012-09-06 SURGERY — RIGHT HEART CATH
Anesthesia: LOCAL

## 2012-09-06 MED ORDER — HYDRALAZINE HCL 10 MG PO TABS
10.0000 mg | ORAL_TABLET | Freq: Three times a day (TID) | ORAL | Status: DC
  Administered 2012-09-06: 10 mg via ORAL
  Filled 2012-09-06 (×6): qty 1

## 2012-09-06 MED ORDER — FENTANYL CITRATE 0.05 MG/ML IJ SOLN
INTRAMUSCULAR | Status: AC
  Filled 2012-09-06: qty 2

## 2012-09-06 MED ORDER — POTASSIUM CHLORIDE CRYS ER 20 MEQ PO TBCR
40.0000 meq | EXTENDED_RELEASE_TABLET | Freq: Once | ORAL | Status: AC
  Administered 2012-09-06: 40 meq via ORAL
  Filled 2012-09-06: qty 2

## 2012-09-06 MED ORDER — FLEET ENEMA 7-19 GM/118ML RE ENEM
1.0000 | ENEMA | Freq: Once | RECTAL | Status: DC
  Filled 2012-09-06: qty 1

## 2012-09-06 MED ORDER — MIDAZOLAM HCL 2 MG/2ML IJ SOLN
INTRAMUSCULAR | Status: AC
  Filled 2012-09-06: qty 2

## 2012-09-06 MED ORDER — LIDOCAINE HCL (PF) 1 % IJ SOLN
INTRAMUSCULAR | Status: AC
  Filled 2012-09-06: qty 30

## 2012-09-06 MED ORDER — ONDANSETRON HCL 4 MG/2ML IJ SOLN: 4.0000 mg | Freq: Four times a day (QID) | INTRAMUSCULAR | Status: DC | PRN

## 2012-09-06 MED ORDER — SODIUM CHLORIDE 0.9 % IV SOLN
250.0000 mL | INTRAVENOUS | Status: DC | PRN
  Administered 2012-09-07: 250 mL via INTRAVENOUS

## 2012-09-06 MED ORDER — SODIUM CHLORIDE 0.9 % IJ SOLN: 3.0000 mL | INTRAMUSCULAR | Status: DC | PRN

## 2012-09-06 MED ORDER — POLYETHYLENE GLYCOL 3350 17 G PO PACK
17.0000 g | PACK | Freq: Every day | ORAL | Status: DC | PRN
  Administered 2012-09-06: 17 g via ORAL
  Filled 2012-09-06: qty 1

## 2012-09-06 MED ORDER — HEPARIN (PORCINE) IN NACL 2-0.9 UNIT/ML-% IJ SOLN
INTRAMUSCULAR | Status: AC
  Filled 2012-09-06: qty 500

## 2012-09-06 MED ORDER — HEPARIN SODIUM (PORCINE) 5000 UNIT/ML IJ SOLN
5000.0000 [IU] | Freq: Three times a day (TID) | INTRAMUSCULAR | Status: DC
  Administered 2012-09-06 – 2012-09-10 (×11): 5000 [IU] via SUBCUTANEOUS
  Filled 2012-09-06 (×15): qty 1

## 2012-09-06 MED ORDER — FUROSEMIDE 40 MG PO TABS
60.0000 mg | ORAL_TABLET | Freq: Two times a day (BID) | ORAL | Status: DC
  Administered 2012-09-07 – 2012-09-10 (×7): 60 mg via ORAL
  Filled 2012-09-06 (×10): qty 1

## 2012-09-06 MED ORDER — POTASSIUM CHLORIDE 20 MEQ PO PACK
40.0000 meq | PACK | Freq: Once | ORAL | Status: DC
  Filled 2012-09-06: qty 2

## 2012-09-06 MED ORDER — SODIUM CHLORIDE 0.9 % IJ SOLN
3.0000 mL | Freq: Two times a day (BID) | INTRAMUSCULAR | Status: DC
  Administered 2012-09-07: 3 mL via INTRAVENOUS

## 2012-09-06 MED ORDER — ISOSORBIDE MONONITRATE ER 30 MG PO TB24
30.0000 mg | ORAL_TABLET | Freq: Every day | ORAL | Status: DC
  Administered 2012-09-06 – 2012-09-10 (×4): 30 mg via ORAL
  Filled 2012-09-06 (×5): qty 1

## 2012-09-06 MED ORDER — ACETAMINOPHEN 325 MG PO TABS: 650.0000 mg | ORAL_TABLET | ORAL | Status: DC | PRN

## 2012-09-06 NOTE — Progress Notes (Signed)
Pt scheduled for cath this AM, there were no orders written for prep. Spoke with cath lab nurse and said that was ok and the prep would be done in the cath lab.   Janee Ureste M

## 2012-09-06 NOTE — CV Procedure (Addendum)
   Cardiac Catheterization Procedure Note  Name: [name] MRN: [id number] DOB: 18-Nov-1945  Procedure: Right Heart Cath  Indication: CHF, CKD   Procedural Details: The right groin was prepped, draped, and anesthetized with 1% lidocaine. Using the modified Seldinger technique a 7 French sheath was placed in the right femoral vein. A Swan-Ganz catheter was used for the right heart catheterization. Standard protocol was followed for recording of right heart pressures and sampling of oxygen saturations. Fick and thermodilution cardiac output was calculated.  There were no immediate procedural complications. The patient was transferred to the post catheterization recovery area for further monitoring.  Procedural Findings: Hemodynamics (mmHg) RA mean 7 RV 36/7 PA 31/19, mean 23 PCWP mean 21  Oxygen saturations: PA 65% AO 98%  Cardiac Output (Fick) 7.1  Cardiac Index (Fick) 3.66  Cardiac Output (Thermodilution) 4.81 Cardiac Index (Thermodilution) 2.48   Final Conclusions:  Better hemodynamics than I expected.  Left heart filling pressure is mildly elevated.  Will continue milrinone today, transition off tomorrow and on to low dose hydralazine/nitrates. Given creatinine up significantly from baseline, will hold any additional diuretic today and start po Lasix tomorrow (will use 60 mg po bid).  Given his distended abdomen, will order abdominal US to assess for cirrhosis and ascites.  He is being followed by GI, will need EGD.  From my standpoint, it can be done whenever GI is ready.   Marca Ancona 09/06/2012, 11:03 AM

## 2012-09-06 NOTE — Progress Notes (Signed)
[facility] PROGRESS NOTE  [name]:[id number] DOB: [birth year] DOA: 09/01/2012 PCP: Dina Rich, MD  Brief Narrative: 67 y.o. male with history of type II DM, HTN, hyperlipidemia, CAD status post CABG and stents, chronic kidney disease with baseline creatinine 1.9, chronic systolic CHF (last EF said to be 40%) and GERD was transferred from [facility] ED to [facility] for further evaluation and management of worsening dyspnea and leg swelling. Patient gives a 2 week history of gradually worsening bilateral lower extremity swelling, abdominal distention and DOE. He saw his primary cardiologist last week and was placed on high dose Lasix for a few days. Patient's symptoms initially improved but in the last 3 or 4 days, he is noted worsening DOE when he walks a few steps. He denies dyspnea at rest, chest pain, palpitations. He normally uses 2 pillows which has not changed. He has noticed worsening leg swelling. He presented to the [facility] ED where hemoglobin was 7 g/dL (patient states that recently his hemoglobin has been 9.1 and 8.4 g/dL) and creatinine of 2.5. His case was discussed with the hospitalist and cardiologist there who advised transfer to tertiary hospital. Patient denies any bleeding, vomiting or melena. Patient was transfused 1 unit of PRBCs on route to [facility]. His O2 sats at [facility] was 90% on room air.  Assessment/Plan: 1. Acute on chronic systolic CHF: Admited to telemetry. 2-D echocardiogram done 3/14, poor windows. Cardiology consulted. - MRI shows EF 37% - His renal function somewhat stable.  - RHC today, impressions below. Plan per cardiology.  2. Right pleural effusion: Possibly transudate of from decompensated CHF. No symptoms or signs suggestive of pneumonia.Lateral decubitus shows no evidence of free flowing effusion,  3. Anemia: Possibly of chronic disease and chronic kidney disease.  - GI evaluating, EGD tomorrow (?) 4. Acute  on chronic kidney disease: Likely secondary to cardiorenal syndrome. Monitor 5. DM2 - continue Lantus and sliding scale insulin  6. HTN - on Carvedilol. Controlled. 7. Chronic low back pain: Pain management.   Code Status: Full Family Communication: none  Disposition Plan: TBD  Consultants:  Cardiology  GI  Procedures:  2D echo  Study Conclusions - Left ventricle: There is motion at the base. The images are severely limited. There is septal dyssynergy. Endocardium is seen on limited basis. I can not estimate LV function. The cavity size was mildly dilated. Wall thickness was increased in a pattern of mild LVH. - Left atrium: The atrium was moderately to severely dilated. - Right ventricle: Not able to assess RV function due to poor visualization. Not able to assess RV size due to poor visualization. - Impressions: A different technique will be needed to assess LV. Echo with contrast may help.  RHC 09/06/12 Final Conclusions: Better hemodynamics than I expected. Left heart filling pressure is mildly elevated. Will continue milrinone today, transition off tomorrow and on to low dose hydralazine/nitrates. Given creatinine up significantly from baseline, will hold any additional diuretic today and start po Lasix tomorrow (will use 60 mg po bid).   Antibiotics:  none  HPI/Subjective: - sleepy after cath, denies complaints.   Objective: Filed Vitals:   09/05/12 1634 09/05/12 1919 09/06/12 0355 09/06/12 1029  BP: 121/63 108/56 122/59   Pulse: 87 80 86 62  Temp:  98.5 F (36.9 C) 98.3 F (36.8 C)   TempSrc:  Oral Oral   Resp:  18 17   Height:      Weight:   82.101 kg (181 lb)  SpO2:  99% 96%     Intake/Output Summary (Last 24 hours) at 09/06/12 1248 Last data filed at 09/06/12 1000  Gross per 24 hour  Intake 328.38 ml  Output   1500 ml  Net -1171.62 ml   Filed Weights   09/04/12 0413 09/05/12 0607 09/06/12 0355  Weight: 81.421 kg (179 lb 8 oz) 81.874 kg (180  lb 8 oz) 82.101 kg (181 lb)    Exam:   General:  NAD  Cardiovascular: regular rate and rhythm, without MRG.   Respiratory: good air movement, clear to auscultation throughout, no wheezing, ronchi or rales, decreased breath sounds in the right lower base, improved today compared to yesterday  Abdomen: soft, not tender to palpation, positive bowel sounds  MSK: trace peripheral edema  Neuro: CN 2-12 grossly intact, MS 5/5 in all 4  Data Reviewed: Basic Metabolic Panel:  Recent Labs Lab 09/02/12 0803 09/03/12 0522 09/04/12 0543 09/05/12 0550 09/06/12 0500  NA 135 134* 132* 132* 133*  K 3.5 3.3* 4.4 4.1 3.5  CL 93* 92* 93* 91* 91*  CO2 31 32 26 28 31   GLUCOSE 151* 172* 158* 132* 153*  BUN 58* 67* 74* 76* 77*  CREATININE 2.39* 2.64* 2.72* 2.71* 2.75*  CALCIUM 8.5 8.6 8.8 8.7 8.8   Liver Function Tests:  Recent Labs Lab 09/01/12 0430  AST 23  ALT 14  ALKPHOS 131*  BILITOT 1.4*  PROT 6.5  ALBUMIN 2.6*   CBC:  Recent Labs Lab 09/02/12 0803 09/03/12 0522 09/05/12 0550 09/05/12 1601 09/06/12 0500  WBC 7.0 6.9 5.2 6.2 5.6  HGB 9.2* 9.0* 7.6* 7.8* 8.1*  HCT 27.8* 27.4* 22.7* 23.7* 24.3*  MCV 86.9 87.0 84.7 86.8 85.3  PLT 242 252 PLATELET CLUMPS NOTED ON SMEAR, UNABLE TO ESTIMATE 271 250   Cardiac Enzymes:  Recent Labs Lab 09/01/12 0430 09/01/12 0933 09/01/12 1602  TROPONINI <0.30 <0.30 <0.30   BNP (last 3 results)  Recent Labs  09/01/12 0430  PROBNP 10584.0*   CBG:  Recent Labs Lab 09/05/12 1602 09/05/12 2100 09/05/12 2228 09/06/12 0604 09/06/12 1131  GLUCAP 174* 149* 174* 147* 132*   Studies: No results found.  Scheduled Meds: . aspirin  81 mg Oral Daily  . carvedilol  3.125 mg Oral BID WC  . [START ON 09/07/2012] furosemide  60 mg Oral BID  . heparin  5,000 Units Subcutaneous Q8H  . hydrALAZINE  10 mg Oral Q8H  . insulin aspart  0-5 Units Subcutaneous QHS  . insulin aspart  0-9 Units Subcutaneous TID WC  . insulin glargine  15  Units Subcutaneous QHS  . isosorbide mononitrate  30 mg Oral Daily  . multivitamin with minerals  1 tablet Oral Daily  . pantoprazole  40 mg Oral BID  . potassium chloride  40 mEq Oral Once  . senna  1 tablet Oral BID  . simvastatin  20 mg Oral QPM  . sodium chloride  3 mL Intravenous Q12H  . sodium chloride  3 mL Intravenous Q12H  . sucralfate  1 g Oral TID PC & HS   Continuous Infusions: . milrinone 0.25 mcg/kg/min (09/05/12 1515)    Active Problems:   Anemia   Renal failure (ARF), acute on chronic   Pleural effusion, right   Diabetes type 2, uncontrolled   HTN (hypertension)   Hyperlipidemia   CAD (coronary artery disease), S/P CABG & stents   Systolic CHF, acute on chronic   Chronic LBP  Pamella Pert, MD [facility] Pager [phone].  If 7 PM - 7 AM, please contact night-coverage at [email or web address], password [facility] 09/06/2012, 12:48 PM  LOS: 5 days

## 2012-09-06 NOTE — Progress Notes (Signed)
     [name] Daily Rounding Note 09/06/2012, 8:32 AM  SUBJECTIVE:       For cardiac cath today. Anxious about what will be found.  No acute SOB.  No abd pain.  Some nausea but no vomiting.  No BMs, feels like a laxative would be helpful  OBJECTIVE:         Vital signs in last 24 hours:    Temp:  [98.3 F (36.8 C)-98.5 F (36.9 C)] 98.3 F (36.8 C) (03/19 0355) Pulse Rate:  [66-87] 86 (03/19 0355) Resp:  [17-18] 17 (03/19 0355) BP: (103-122)/(39-63) 122/59 mmHg (03/19 0355) SpO2:  [94 %-99 %] 96 % (03/19 0355) Weight:  [82.101 kg (181 lb)] 82.101 kg (181 lb) (03/19 0355) Last BM Date: 09/02/12 General: looks chronically unwell   Heart: RRR Chest: clear but diminished BS.  No labored breathing Abdomen: soft, protuberant,  No tenderness.  Active BS.  Extremities: no pedal edema Neuro/Psych:  Pleasant, oriented x 3.  No tremor.   Intake/Output from previous day: 03/18 0701 - 03/19 0700 In: 481.7 [P.O.:360; I.V.:51.7] Out: 1700 [Urine:1700]  Intake/Output this shift: Total I/O In: -  Out: 225 [Urine:225]  Lab Results: CBC    Component Value Date/Time   WBC 5.6 09/06/2012 0500   RBC 2.85* 09/06/2012 0500   HGB 8.1* 09/06/2012 0500   HCT 24.3* 09/06/2012 0500   PLT 250 09/06/2012 0500   MCV 85.3 09/06/2012 0500   MCH 28.4 09/06/2012 0500   MCHC 33.3 09/06/2012 0500   RDW 15.9* 09/06/2012 0500   LYMPHSABS 2.9 10/12/2007 1805   MONOABS 0.5 10/12/2007 1805   EOSABS 0.4 10/12/2007 1805   BASOSABS 0.0 10/12/2007 1805     BMET  Recent Labs  09/04/12 0543 09/05/12 0550 09/06/12 0500  NA 132* 132* 133*  K 4.4 4.1 3.5  CL 93* 91* 91*  CO2 26 28 31   GLUCOSE 158* 132* 153*  BUN 74* 76* 77*  CREATININE 2.72* 2.71* 2.75*  CALCIUM 8.8 8.7 8.8    PT/INR  Recent Labs  09/05/12 1601  LABPROT 12.4  INR 0.93   Pending: Studies/Results: Mr Card Morphology W/o Cm 09/04/2012    IMPRESSION: Normal LV size with moderate systolic dysfunction, EF 37%.  Wall motion abnormalities  as noted above.  Technically difficult study due to poor breath-holding.   Original Report Authenticated By: Marca Ancona     ASSESMENT: * Normocytic anemia with FOB + stool in pt with known GAVE syndrome. Hx of chronic disease and blood loss anemia in past years. S/p one unit blood.  EGD with ablation on several occasions, most recent: December 2013. Capsule endoscopy 01/2012 for transfusion requiring anemia. Pt says he had colonoscopy but it it not in the recent [facility] records.  Suspect his renal failure is causative along with possible GAVE related recurrent blood loss.  * Cirrhosis by chest CT in 06/2012. No varices on EGDs.  * CAD   PLAN: *  Ordered hepatitis serologies. *  Cardiac cath today. *  ultrasound abdomen, ? Timing.  *  EGD with APC, ? Timing. *  Laxative this afternoon when off bed rest.    LOS: 5 days   Jennye Moccasin  09/06/2012, 8:32 AM Pager: [phone]

## 2012-09-06 NOTE — Progress Notes (Signed)
Patient seen, examined, and I agree with the above documentation, including the assessment and plan. RHC today, hemodynamics better than cardiology had expected, mildly elevated left heart pressures.   Given hx of GAVE with APC in the past, last Dec 2013, I expect this is the source of chronic blood loss and recent anemia. Will plan EGD with possible APC ablation of GAVE if present. Constipation, enema this evening (pt requested) Await [place] abd - eval ascites/liver

## 2012-09-06 NOTE — H&P (View-Only) (Signed)
Advanced Heart Failure Rounding Note   Subjective:    67 yo with history of CAD s/p CABG and subsequent PCI, presumed diastolic CHF, and CKD presented with dyspnea/CHF exacerbation. Patient has been seen by Dr. Cooper in the past but currently seems to be getting most of his care down in [place].   3/17: cMRI showed LVEF 37% with mid to apical anterior, mid to apical anteroseptal and mid to apical inferior walls with mod to severe hypokinesis.  Normal RV.    Milrinone added 3/16 for worsening renal function.  Cr 2.38>2.64>2.72>2.71  Says he is feeling better.  Wt unchanged from admit.  No orthopnea/PND.  +lower extremity edema.  Objective:     Vital Signs:   Temp:  [98.3 F (36.8 C)-98.8 F (37.1 C)] 98.8 F (37.1 C) (03/18 0504) Pulse Rate:  [66-74] 66 (03/18 0935) Resp:  [18-19] 18 (03/18 0504) BP: (92-123)/(39-71) 103/39 mmHg (03/18 0935) SpO2:  [94 %-95 %] 94 % (03/18 0504) Weight:  [180 lb 8 oz (81.874 kg)] 180 lb 8 oz (81.874 kg) (03/18 0607) Last BM Date: 09/02/12  Weight change: Filed Weights   09/03/12 0416 09/04/12 0413 09/05/12 0607  Weight: 179 lb 1.6 oz (81.239 kg) 179 lb 8 oz (81.421 kg) 180 lb 8 oz (81.874 kg)    Intake/Output:   Intake/Output Summary (Last 24 hours) at 09/05/12 1046 Last data filed at 09/05/12 0941  Gross per 24 hour  Intake 1235.85 ml  Output   1275 ml  Net -39.15 ml     Physical Exam: General:  Chronically ill appearing. No resp difficulty HEENT: normal Neck: supple. JVP jaw. Carotids 2+ bilat; no bruits. No lymphadenopathy or thryomegaly appreciated. Cor: PMI nondisplaced. Regular rate & rhythm. 2/6 systolic murmur. Lungs: clear Abdomen: soft, nontender, mild distention. No hepatosplenomegaly. No bruits or masses. Good bowel sounds. Extremities: no cyanosis, clubbing, rash, 2+ Rt lower extremity tr on Lt lower extremity edema Neuro: alert & orientedx3, cranial nerves grossly intact. moves all 4 extremities w/o difficulty. Affect  pleasant  Telemetry: SR 70-80s  Labs: Basic Metabolic Panel:  Recent Labs Lab 09/01/12 0430 09/02/12 0803 09/03/12 0522 09/04/12 0543 09/05/12 0550  NA 134* 135 134* 132* 132*  K 3.3* 3.5 3.3* 4.4 4.1  CL 93* 93* 92* 93* 91*  CO2 30 31 32 26 28  GLUCOSE 167* 151* 172* 158* 132*  BUN 54* 58* 67* 74* 76*  CREATININE 2.38* 2.39* 2.64* 2.72* 2.71*  CALCIUM 8.8 8.5 8.6 8.8 8.7    Liver Function Tests:  Recent Labs Lab 09/01/12 0430  AST 23  ALT 14  ALKPHOS 131*  BILITOT 1.4*  PROT 6.5  ALBUMIN 2.6*   No results found for this basename: LIPASE, AMYLASE,  in the last 168 hours No results found for this basename: AMMONIA,  in the last 168 hours  CBC:  Recent Labs Lab 09/01/12 0430 09/02/12 0803 09/03/12 0522 09/05/12 0550  WBC 8.4 7.0 6.9 5.2  HGB 9.6* 9.2* 9.0* 7.6*  HCT 29.6* 27.8* 27.4* 22.7*  MCV 87.3 86.9 87.0 84.7  PLT 236 242 252 PLATELET CLUMPS NOTED ON SMEAR, UNABLE TO ESTIMATE    Cardiac Enzymes:  Recent Labs Lab 09/01/12 0430 09/01/12 0933 09/01/12 1602  TROPONINI <0.30 <0.30 <0.30    BNP: BNP (last 3 results)  Recent Labs  09/01/12 0430  PROBNP 10584.0*      Imaging: Dg Chest Right Decubitus  09/03/2012  *RADIOLOGY REPORT*  Clinical Data: Right pleural effusion.  CHEST - RIGHT DECUBITUS    Comparison: 09/01/2012  Findings: A right lateral decubitus view of the chest demonstrates no definite layering effusion. Right lateral basilar opacity is noted and could represent consolidation versus loculated pleural fluid.  IMPRESSION: No evidence of free flowing effusion.  Right lateral basilar opacity again identified - question consolidation versus loculated pleural fluid.   Original Report Authenticated By: Jeffrey Hu, M.D.    Mr Card Morphology W/o Cm  09/04/2012  *RADIOLOGY REPORT*  Clinical Data: Cardiomyopathy, unable to assess LV function by echo.  MR CARDIA MORPHOLOGY WITHOUT CONTRAST  GE 1.5 T magnet with dedicated cardiac coil.   FIESTA sequences for function and morphology.  Free-breathing sequences were used.  No contrast was used due to renal dysfunction.  EF was calculated at a dedicated workstation.  Contrast:  Comparison: None.  Findings: Difficult study as patient had a hard time holding his breath.  Free-breathing sequences were used.  Normal left ventricular size and wall thickness.  The mid to apical anterior, mid to apical anteroseptal, and mid to apical inferior walls appeared moderately to severely hypokinetic. EF was calculated to be 37%.  Normal right ventricular size and systolic function. Moderate left atrial enlargement.  Mild to moderate right atrial enlargement.  There appeared to be mild mitral regurgitation though flow sequences to quantify were not done.  Trileaflet aortic valve without significant stenosis.  Measurements:  LV EDV 193 mL  LV SV 71 mL  LV EF 37%  IMPRESSION: Normal LV size with moderate systolic dysfunction, EF 37%.  Wall motion abnormalities as noted above.  Technically difficult study due to poor breath-holding.   Original Report Authenticated By: Harshal Sirmon      Medications:     Scheduled Medications: . aspirin  81 mg Oral Daily  . carvedilol  6.25 mg Oral BID WC  . furosemide  40 mg Intravenous Daily  . hydrALAZINE  12.5 mg Oral Q8H  . insulin aspart  0-5 Units Subcutaneous QHS  . insulin aspart  0-9 Units Subcutaneous TID WC  . insulin glargine  15 Units Subcutaneous QHS  . isosorbide mononitrate  30 mg Oral Daily  . multivitamin with minerals  1 tablet Oral Daily  . ranolazine  1,000 mg Oral BID  . senna  1 tablet Oral BID  . simvastatin  20 mg Oral QPM  . sodium chloride  3 mL Intravenous Q12H    Infusions: . milrinone 0.125 mcg/kg/min (09/04/12 1308)    PRN Medications: albuterol, ondansetron (ZOFRAN) IV, ondansetron, oxyCODONE, polyethylene glycol   Assessment/Plan:   1. 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   cMRI showed EF 37%.   - Cardiorenal syndrome continues to get worse despite addition of low dose milrinone.  Increase milrinone 0.25.  Will proceed with RHC tomorrow to further access hemodynamics. - Will increase Lasix to 60 mg IV TID.    - Continue Coreg but cut back to 3.125 BID, stop hydralazine/imdur with soft BP.  No ACEI with renal dysfunction.  2. CAD: S/p CABG and subsequent PCI. No chest pain, negative cardiac enzymes. Continue ASA and statin. He is also on ranolazine.  3. CKD: Unsure baseline creatinine although it was 1.5 in 10/2011.  Creatinine worsened initially, now stable.  Suspect cardiorenal syndrome.  As above will proceed with RHC tomorrow.   4. Anemia: ? Renal disease/chronic disease but FOBT+. Hgb down to 7.6 (9.6 on admit).  Will involve GI.    5.   Rhythm: Probably NSR with low voltage P waves. Similar to prior ECG last year.   Length of Stay: 4 Kreig Parson 09/05/2012 10:48 AM      

## 2012-09-06 NOTE — Progress Notes (Signed)
Physical Therapy Treatment Patient Details Name: [name] MRN: [id number] DOB: 24-Oct-1945 Today's Date: 09/06/2012 Time: 4098-1191 PT Time Calculation (min): 15 min  PT Assessment / Plan / Recommendation Comments on Treatment Session  Feeling much better, happy with the initial report from Cath.  Though mildly unsteady, much improved gait stability over testerday.    Follow Up Recommendations  Home health PT;Supervision/Assistance - 24 hour     Does the patient have the potential to tolerate intense rehabilitation     Barriers to Discharge        Equipment Recommendations  None recommended by PT    Recommendations for Other Services    Frequency Min 3X/week   Plan Discharge plan remains appropriate;Frequency remains appropriate    Precautions / Restrictions Precautions Precautions: Fall Restrictions Weight Bearing Restrictions: No   Pertinent Vitals/Pain     Mobility  Bed Mobility Bed Mobility: Not assessed Transfers Transfers: Sit to Stand;Stand to Sit Sit to Stand: 5: Supervision Stand to Sit: 5: Supervision Details for Transfer Assistance: safe mobility, pt more alert and rested looking today Ambulation/Gait Ambulation/Gait Assistance: 4: Min guard Ambulation Distance (Feet): 225 Feet Assistive device: Rolling walker Ambulation/Gait Assistance Details: more steady, less wandereing/veering L.  pt able to interact/scan his environment today.  Still mildly unsteady. Gait Pattern: Step-through pattern;Decreased step length - right;Decreased step length - left;Decreased stride length;Shuffle Gait velocity: decreased Stairs: No (pt defered) Wheelchair Mobility Wheelchair Mobility: No    Exercises     PT Diagnosis:    PT Problem List:   PT Treatment Interventions:     PT Goals Acute Rehab PT Goals Time For Goal Achievement: 09/08/12 Potential to Achieve Goals: Good Pt will go Supine/Side to Sit: Independently PT Goal: Supine/Side to Sit - Progress:  Progressing toward goal Pt will Sit at Edge of Bed: Independently;3-5 min;with no upper extremity support PT Goal: Sit at Edge Of Bed - Progress: Met Pt will go Sit to Stand: Independently PT Goal: Sit to Stand - Progress: Progressing toward goal Pt will Transfer Bed to Chair/Chair to Bed: Independently PT Transfer Goal: Bed to Chair/Chair to Bed - Progress: Progressing toward goal Pt will Ambulate: 51 - 150 feet;with modified independence;with least restrictive assistive device PT Goal: Ambulate - Progress: Progressing toward goal  Visit Information  Last PT Received On: 09/06/12 Assistance Needed: +1    Subjective Data  Subjective: Oh, I feel a whole lot better.Marland KitchenMarland KitchenI've just put it in HIS hands.   Cognition  Cognition Overall Cognitive Status: Appears within functional limits for tasks assessed/performed Arousal/Alertness: Awake/alert Orientation Level: Appears intact for tasks assessed Behavior During Session: [facility] for tasks performed    Balance  Balance Balance Assessed: Yes Static Standing Balance Static Standing - Balance Support: During functional activity;Left upper extremity supported Static Standing - Level of Assistance: 5: Stand by assistance Static Standing - Comment/# of Minutes: standing at EOB holding urinal >1 min  End of Session PT - End of Session Activity Tolerance: Patient tolerated treatment well Patient left: Other (comment);with call bell/phone within reach (sitting EOB) Nurse Communication: Mobility status   GP     Hyun Reali, Eliseo Gum 09/06/2012, 5:21 PM 09/06/2012  Speers Bing, PT [phone] (pager)

## 2012-09-06 NOTE — Interval H&P Note (Signed)
History and Physical Interval Note:  09/06/2012 10:24 AM  [name]  has presented today for surgery, with the diagnosis of hf  The various methods of treatment have been discussed with the patient and family. After consideration of risks, benefits and other options for treatment, the patient has consented to  Procedure(s): RIGHT HEART CATH (N/A) as a surgical intervention .  The patient's history has been reviewed, patient examined, no change in status, stable for surgery.  I have reviewed the patient's chart and labs.  Questions were answered to the patient's satisfaction.     Dalton [employer]

## 2012-09-07 ENCOUNTER — Inpatient Hospital Stay (HOSPITAL_COMMUNITY): Payer: PRIVATE HEALTH INSURANCE

## 2012-09-07 ENCOUNTER — Encounter (HOSPITAL_COMMUNITY): Payer: Self-pay | Admitting: Gastroenterology

## 2012-09-07 ENCOUNTER — Encounter (HOSPITAL_COMMUNITY)
Admission: EM | Disposition: A | Discharge status: Home / Self Care | Payer: Self-pay | Source: Other Acute Inpatient Hospital | Attending: Internal Medicine

## 2012-09-07 DIAGNOSIS — K31819 Angiodysplasia of stomach and duodenum without bleeding: Secondary | ICD-10-CM | POA: Diagnosis present

## 2012-09-07 HISTORY — DX: Angiodysplasia of stomach and duodenum without bleeding: K31.819

## 2012-09-07 HISTORY — PX: ESOPHAGOGASTRODUODENOSCOPY: SHX5428

## 2012-09-07 LAB — HEPATITIS PANEL, ACUTE: HCV Ab: NEGATIVE

## 2012-09-07 LAB — BODY FLUID CELL COUNT WITH DIFFERENTIAL: Total Nucleated Cell Count, Fluid: 120 cu mm (ref 0–1000)

## 2012-09-07 LAB — ALBUMIN, FLUID (OTHER)

## 2012-09-07 LAB — CBC
MCH: 28.1 pg (ref 26.0–34.0)
MCV: 85.3 fL (ref 78.0–100.0)
Platelets: 281 10*3/uL (ref 150–400)
RDW: 15.8 % — ABNORMAL HIGH (ref 11.5–15.5)
WBC: 5.8 10*3/uL (ref 4.0–10.5)

## 2012-09-07 LAB — BASIC METABOLIC PANEL
Calcium: 8.8 mg/dL (ref 8.4–10.5)
Creatinine, Ser: 2.49 mg/dL — ABNORMAL HIGH (ref 0.50–1.35)
GFR calc Af Amer: 30 mL/min — ABNORMAL LOW (ref >90)

## 2012-09-07 LAB — GLUCOSE, CAPILLARY: Glucose-Capillary: 180 mg/dL — ABNORMAL HIGH (ref 70–99)

## 2012-09-07 LAB — AFP TUMOR MARKER: AFP-Tumor Marker: <1.3 ng/mL (ref 0.0–8.0)

## 2012-09-07 SURGERY — EGD (ESOPHAGOGASTRODUODENOSCOPY)
Anesthesia: Moderate Sedation

## 2012-09-07 MED ORDER — MIDAZOLAM HCL 5 MG/ML IJ SOLN
INTRAMUSCULAR | Status: AC
  Filled 2012-09-07: qty 2

## 2012-09-07 MED ORDER — ALBUMIN HUMAN 25 % IV SOLN
50.0000 g | Freq: Once | INTRAVENOUS | Status: AC
  Administered 2012-09-07: 50 g via INTRAVENOUS
  Filled 2012-09-07: qty 200

## 2012-09-07 MED ORDER — SODIUM CHLORIDE 0.9 % IV SOLN
INTRAVENOUS | Status: DC
  Administered 2012-09-08: 06:00:00 via INTRAVENOUS

## 2012-09-07 MED ORDER — FENTANYL CITRATE 0.05 MG/ML IJ SOLN
INTRAMUSCULAR | Status: DC | PRN
  Administered 2012-09-07 (×2): 25 ug via INTRAVENOUS

## 2012-09-07 MED ORDER — METOCLOPRAMIDE HCL 5 MG/ML IJ SOLN
10.0000 mg | Freq: Four times a day (QID) | INTRAMUSCULAR | Status: AC
  Administered 2012-09-07 – 2012-09-08 (×2): 10 mg via INTRAVENOUS
  Filled 2012-09-07 (×5): qty 2

## 2012-09-07 MED ORDER — MIDAZOLAM HCL 10 MG/2ML IJ SOLN
INTRAMUSCULAR | Status: DC | PRN
  Administered 2012-09-07 (×2): 2 mg via INTRAVENOUS

## 2012-09-07 MED ORDER — SODIUM CHLORIDE 0.9 % IV SOLN: INTRAVENOUS | Status: DC

## 2012-09-07 MED ORDER — BUTAMBEN-TETRACAINE-BENZOCAINE 2-2-14 % EX AERO
INHALATION_SPRAY | CUTANEOUS | Status: DC | PRN
  Administered 2012-09-07: 1 via TOPICAL

## 2012-09-07 MED ORDER — HYDRALAZINE HCL 25 MG PO TABS
25.0000 mg | ORAL_TABLET | Freq: Three times a day (TID) | ORAL | Status: DC
  Administered 2012-09-07 – 2012-09-10 (×8): 25 mg via ORAL
  Filled 2012-09-07 (×13): qty 1

## 2012-09-07 MED ORDER — FENTANYL CITRATE 0.05 MG/ML IJ SOLN
INTRAMUSCULAR | Status: AC
  Filled 2012-09-07: qty 2

## 2012-09-07 NOTE — Progress Notes (Addendum)
[facility] PROGRESS NOTE  [name] WGN:[id number] DOB: February 21, 1946 DOA: 09/01/2012 PCP: Dina Rich, MD  Brief Narrative: 67 y.o. male with history of type II DM, HTN, hyperlipidemia, CAD status post CABG and stents, chronic kidney disease with baseline creatinine 1.9, chronic systolic CHF (last EF said to be 40%) and GERD was transferred from [facility] ED to [facility] for further evaluation and management of worsening dyspnea and leg swelling. Patient gives a 2 week history of gradually worsening bilateral lower extremity swelling, abdominal distention and DOE. He saw his primary cardiologist last week and was placed on high dose Lasix for a few days. Patient's symptoms initially improved but in the last 3 or 4 days, he is noted worsening DOE when he walks a few steps. He denies dyspnea at rest, chest pain, palpitations. He normally uses 2 pillows which has not changed. He has noticed worsening leg swelling. He presented to the [facility] ED where hemoglobin was 7 g/dL (patient states that recently his hemoglobin has been 9.1 and 8.4 g/dL) and creatinine of 2.5. His case was discussed with the hospitalist and cardiologist there who advised transfer to tertiary hospital. Patient denies any bleeding, vomiting or melena. Patient was transfused 1 unit of PRBCs on route to [facility]. His O2 sats at [facility] was 90% on room air.  Assessment/Plan: 1. Acute on chronic systolic CHF: Admited to telemetry. 2-D echocardiogram done 3/14, poor windows. Cardiology consulted. - MRI shows EF 37% - plan per cardiolog 2. Right pleural effusion: Possibly transudate of from decompensated CHF. No symptoms or signs suggestive of pneumonia.Lateral decubitus shows no evidence of free flowing effusion 3. Anemia: Possibly of chronic disease and chronic kidney disease.  - GI evaluating, EGD today with poor visualization due to food in stomach ?gastroparesis - repeat EGD  tomorrow 4. Acute on chronic kidney disease: Likely secondary to cardiorenal syndrome. Monitor 5. Cirrhosis - decompensated given ascites. No asterixis; no EVs on upper edno. US guided para planned.  6. DM2 - continue Lantus and sliding scale insulin  7. HTN - on Carvedilol. Controlled. 8. Chronic low back pain: Pain management.   Code Status: Full Family Communication: none  Disposition Plan: TBD  Consultants:  Cardiology  GI  Procedures:  2D echo  Study Conclusions - Left ventricle: There is motion at the base. The images are severely limited. There is septal dyssynergy. Endocardium is seen on limited basis. I can not estimate LV function. The cavity size was mildly dilated. Wall thickness was increased in a pattern of mild LVH. - Left atrium: The atrium was moderately to severely dilated. - Right ventricle: Not able to assess RV function due to poor visualization. Not able to assess RV size due to poor visualization. - Impressions: A different technique will be needed to assess LV. Echo with contrast may help.  RHC 09/06/12 Final Conclusions: Better hemodynamics than I expected. Left heart filling pressure is mildly elevated. Will continue milrinone today, transition off tomorrow and on to low dose hydralazine/nitrates. Given creatinine up significantly from baseline, will hold any additional diuretic today and start po Lasix tomorrow (will use 60 mg po bid).   Antibiotics:  none  HPI/Subjective: - no complaints, ready for EGD  Objective: Filed Vitals:   09/06/12 1737 09/06/12 1900 09/07/12 0602 09/07/12 0634  BP: 128/72 113/60 114/59   Pulse: 78 83 77   Temp:  98.8 F (37.1 C) 98.7 F (37.1 C)   TempSrc:  Oral Oral   Resp:  18   Height:      Weight:    82.918 kg (182 lb 12.8 oz)  SpO2:  95% 93%     Intake/Output Summary (Last 24 hours) at 09/07/12 0921 Last data filed at 09/06/12 2023  Gross per 24 hour  Intake    120 ml  Output    300 ml  Net    -180 ml   Filed Weights   09/05/12 0607 09/06/12 0355 09/07/12 0634  Weight: 81.874 kg (180 lb 8 oz) 82.101 kg (181 lb) 82.918 kg (182 lb 12.8 oz)    Exam:   General:  NAD  Cardiovascular: irregular, without MRG.   Respiratory: good air movement, clear to auscultation throughout, no wheezing, ronchi or rales, decreased breath sounds in the right lower base, improved today compared to yesterday  Abdomen: soft, not tender to palpation, positive bowel sounds  MSK: trace peripheral edema  Neuro: CN 2-12 grossly intact, MS 5/5 in all 4  Data Reviewed: Basic Metabolic Panel:  Recent Labs Lab 09/04/12 0543 09/05/12 0550 09/06/12 0500 09/06/12 1614 09/07/12 0505  NA 132* 132* 133* 132* 131*  K 4.4 4.1 3.5 3.4* 4.2  CL 93* 91* 91* 92* 92*  CO2 26 28 31 29 28   GLUCOSE 158* 132* 153* 197* 193*  BUN 74* 76* 77* 74* 75*  CREATININE 2.72* 2.71* 2.75* 2.54*  2.52* 2.49*  CALCIUM 8.8 8.7 8.8 8.7 8.8   Liver Function Tests:  Recent Labs Lab 09/01/12 0430  AST 23  ALT 14  ALKPHOS 131*  BILITOT 1.4*  PROT 6.5  ALBUMIN 2.6*   CBC:  Recent Labs Lab 09/05/12 0550 09/05/12 1601 09/06/12 0500 09/06/12 1614 09/07/12 0505  WBC 5.2 6.2 5.6 5.5 5.8  HGB 7.6* 7.8* 8.1* 8.4* 8.4*  HCT 22.7* 23.7* 24.3* 25.9* 25.5*  MCV 84.7 86.8 85.3 86.9 85.3  PLT PLATELET CLUMPS NOTED ON SMEAR, UNABLE TO ESTIMATE 271 250 264 281   Cardiac Enzymes:  Recent Labs Lab 09/01/12 0430 09/01/12 0933 09/01/12 1602  TROPONINI <0.30 <0.30 <0.30   BNP (last 3 results)  Recent Labs  09/01/12 0430  PROBNP 10584.0*   CBG:  Recent Labs Lab 09/06/12 0604 09/06/12 1131 09/06/12 1559 09/06/12 2115 09/07/12 0603  GLUCAP 147* 132* 165* 200* 180*   Studies: US Abdomen Complete  09/06/2012  *RADIOLOGY REPORT*  Clinical Data:  Cirrhosis, ascites  COMPLETE ABDOMINAL ULTRASOUND  Comparison:  None.  Findings:  Gallbladder:  Multiple gallstones are seen lying dependently within an otherwise  normal-appearing gallbladder.  Negative sonographic Murphy's sign.  Common bile duct:  Normal in size measuring 4.6 mm in diameter.  Liver:  There is marked heterogeneity of the hepatic parenchyma. Note is made of a nodular contour of the liver edge. There is an ill-defined area of mixed echogenicity involving the posterior aspect of the right lobe of the liver (image 31).  No definite intrahepatic ductal dilatation.  Large volume intra-abdominal ascites.  IVC:  Obscured by bowel gas  Pancreas:  Obscured by bowel gas  Spleen:  Normal in size measuring 6.8 cm in length  Right Kidney:  Normal cortical thickness, echogenicity and size, measuring 9.8 cm in length.  No focal renal lesions.  No echogenic renal stones.  No urinary obstruction.  Left Kidney:  Normal cortical thickness, echogenicity and size, measuring 9.9 cm in length.  No focal renal lesions.  No echogenic renal stones.  No urinary obstruction.  Abdominal aorta:  Obscured by bowel gas.  IMPRESSION:  1.  Shrunken, nodular appearance of the liver with large volume ascites, findings compatible with provided history of cirrhosis.  2.  Ill-defined area of mixed echogenicity involving the posterior aspect of the right lobe of the liver, suboptimally evaluated on this examination, though a discrete hepatic mass is not excluded. Further evaluation with alpha-fetoprotein level and abdominal MRI is recommended. 3.  Cholelithiasis without evidence of cholecystitis.  This was made a call report.   Original Report Authenticated By: Tacey Ruiz, MD     Scheduled Meds: . [facility] HOLD] aspirin  81 mg Oral Daily  . [facility] HOLD] carvedilol  3.125 mg Oral BID WC  . [facility] HOLD] furosemide  60 mg Oral BID  . heparin  5,000 Units Subcutaneous Q8H  . [MAR HOLD] hydrALAZINE  10 mg Oral Q8H  . [MAR HOLD] insulin aspart  0-5 Units Subcutaneous QHS  . [MAR HOLD] insulin aspart  0-9 Units Subcutaneous TID WC  . [MAR HOLD] insulin glargine  15 Units Subcutaneous QHS  . [MAR  HOLD] isosorbide mononitrate  30 mg Oral Daily  . [MAR HOLD] multivitamin with minerals  1 tablet Oral Daily  . [MAR HOLD] pantoprazole  40 mg Oral BID  . [MAR HOLD] senna  1 tablet Oral BID  . [facility] HOLD] simvastatin  20 mg Oral QPM  . [MAR HOLD] sodium chloride  3 mL Intravenous Q12H  . sodium chloride  3 mL Intravenous Q12H  . [MAR HOLD] sodium phosphate  1 enema Rectal Once  . [facility] HOLD] sucralfate  1 g Oral TID PC & HS   Continuous Infusions: . milrinone 0.25 mcg/kg/min (09/06/12 1524)    Active Problems:   Anemia   Renal failure (ARF), acute on chronic   Pleural effusion, right   Diabetes type 2, uncontrolled   HTN (hypertension)   Hyperlipidemia   CAD (coronary artery disease), S/P CABG & stents   Systolic CHF, acute on chronic   Chronic LBP  Pamella Pert, MD [facility] Pager [phone]. If 7 PM - 7 AM, please contact night-coverage at [email or web address], password [facility] 09/07/2012, 9:21 AM  LOS: 6 days

## 2012-09-07 NOTE — Procedures (Signed)
US guided diagnostic/therapeutic paracentesis performed yielding 6 liters clear,yellow fluid. A portion of the fluid was sent to the lab for preordered studies. No immediate complications.

## 2012-09-07 NOTE — Progress Notes (Signed)
Advanced Heart Failure Rounding Note   Subjective:    67 yo with history of CAD s/p CABG and subsequent PCI, presumed diastolic CHF, and CKD presented with dyspnea/CHF exacerbation. Patient has been seen by Dr. Excell Seltzer in the past but currently seems to be getting most of his care down in [place].   3/17: cMRI showed LVEF 37% with mid to apical anterior, mid to apical anteroseptal and mid to apical inferior walls with mod to severe hypokinesis.  Normal RV.    Milrinone added 3/16 for worsening renal function.  Cr 2.38>2.64>2.72>2.71>2.54>2.49  RHC on Milrinone 0.25 RA mean 7  RV 36/7  PA 31/19, mean 23  PCWP mean 21  Oxygen saturations:  PA 65%  AO 98%  Cardiac Output (Fick) 7.1  Cardiac Index (Fick) 3.66  Cardiac Output (Thermodilution) 4.81  Cardiac Index (Thermodilution) 2.48   Says he feels much better since having BM yesterday evening.  PO lasix started today.  No orthopnea/PND.    Abd u/s: cirrhosis.  Cholelithiasis without cholecystitis.  Mixed echogenicity of posterior aspect of right liver lobe suggestive of ascites. Large volume intra-abdominal ascites    Objective:     Vital Signs:   Temp:  [97.8 F (36.6 C)-99.5 F (37.5 C)] 99.5 F (37.5 C) (03/20 0944) Pulse Rate:  [62-83] 83 (03/20 0944) Resp:  [18] 18 (03/20 0944) BP: (93-137)/(50-72) 137/54 mmHg (03/20 0944) SpO2:  [93 %-98 %] 96 % (03/20 0944) Weight:  [182 lb 12.8 oz (82.918 kg)] 182 lb 12.8 oz (82.918 kg) (03/20 0634) Last BM Date: 09/06/12  Weight change: Filed Weights   09/05/12 0607 09/06/12 0355 09/07/12 0634  Weight: 180 lb 8 oz (81.874 kg) 181 lb (82.101 kg) 182 lb 12.8 oz (82.918 kg)    Intake/Output:   Intake/Output Summary (Last 24 hours) at 09/07/12 0954 Last data filed at 09/06/12 2023  Gross per 24 hour  Intake    120 ml  Output    300 ml  Net   -180 ml     Physical Exam: General:  Chronically ill appearing. No resp difficulty HEENT: normal Neck: supple. JVP 7-8. Carotids  2+ bilat; no bruits. No lymphadenopathy or thryomegaly appreciated. Cor: PMI nondisplaced. Regular rate & rhythm. 2/6 systolic murmur. Lungs: clear Abdomen: soft, nontender, mild distention. No hepatosplenomegaly. No bruits or masses. Good bowel sounds. Extremities: no cyanosis, clubbing, rash, tr-1+ lower extremity edema Neuro: alert & orientedx3, cranial nerves grossly intact. moves all 4 extremities w/o difficulty. Affect pleasant  Telemetry: SR 70-80s  Labs: Basic Metabolic Panel:  Recent Labs Lab 09/04/12 0543 09/05/12 0550 09/06/12 0500 09/06/12 1614 09/07/12 0505  NA 132* 132* 133* 132* 131*  K 4.4 4.1 3.5 3.4* 4.2  CL 93* 91* 91* 92* 92*  CO2 26 28 31 29 28   GLUCOSE 158* 132* 153* 197* 193*  BUN 74* 76* 77* 74* 75*  CREATININE 2.72* 2.71* 2.75* 2.54*  2.52* 2.49*  CALCIUM 8.8 8.7 8.8 8.7 8.8    Liver Function Tests:  Recent Labs Lab 09/01/12 0430  AST 23  ALT 14  ALKPHOS 131*  BILITOT 1.4*  PROT 6.5  ALBUMIN 2.6*   No results found for this basename: LIPASE, AMYLASE,  in the last 168 hours No results found for this basename: AMMONIA,  in the last 168 hours  CBC:  Recent Labs Lab 09/05/12 0550 09/05/12 1601 09/06/12 0500 09/06/12 1614 09/07/12 0505  WBC 5.2 6.2 5.6 5.5 5.8  HGB 7.6* 7.8* 8.1* 8.4* 8.4*  HCT 22.7* 23.7*  24.3* 25.9* 25.5*  MCV 84.7 86.8 85.3 86.9 85.3  PLT PLATELET CLUMPS NOTED ON SMEAR, UNABLE TO ESTIMATE 271 250 264 281    Cardiac Enzymes:  Recent Labs Lab 09/01/12 0430 09/01/12 0933 09/01/12 1602  TROPONINI <0.30 <0.30 <0.30    BNP: BNP (last 3 results)  Recent Labs  09/01/12 0430  PROBNP 10584.0*      Imaging: US Abdomen Complete  09/06/2012  *RADIOLOGY REPORT*  Clinical Data:  Cirrhosis, ascites  COMPLETE ABDOMINAL ULTRASOUND  Comparison:  None.  Findings:  Gallbladder:  Multiple gallstones are seen lying dependently within an otherwise normal-appearing gallbladder.  Negative sonographic Murphy's sign.   Common bile duct:  Normal in size measuring 4.6 mm in diameter.  Liver:  There is marked heterogeneity of the hepatic parenchyma. Note is made of a nodular contour of the liver edge. There is an ill-defined area of mixed echogenicity involving the posterior aspect of the right lobe of the liver (image 31).  No definite intrahepatic ductal dilatation.  Large volume intra-abdominal ascites.  IVC:  Obscured by bowel gas  Pancreas:  Obscured by bowel gas  Spleen:  Normal in size measuring 6.8 cm in length  Right Kidney:  Normal cortical thickness, echogenicity and size, measuring 9.8 cm in length.  No focal renal lesions.  No echogenic renal stones.  No urinary obstruction.  Left Kidney:  Normal cortical thickness, echogenicity and size, measuring 9.9 cm in length.  No focal renal lesions.  No echogenic renal stones.  No urinary obstruction.  Abdominal aorta:  Obscured by bowel gas.  IMPRESSION:  1.  Shrunken, nodular appearance of the liver with large volume ascites, findings compatible with provided history of cirrhosis.  2.  Ill-defined area of mixed echogenicity involving the posterior aspect of the right lobe of the liver, suboptimally evaluated on this examination, though a discrete hepatic mass is not excluded. Further evaluation with alpha-fetoprotein level and abdominal MRI is recommended. 3.  Cholelithiasis without evidence of cholecystitis.  This was made a call report.   Original Report Authenticated By: Tacey Ruiz, MD      Medications:     Scheduled Medications: . [facility] HOLD] aspirin  81 mg Oral Daily  . [facility] HOLD] carvedilol  3.125 mg Oral BID WC  . [facility] HOLD] furosemide  60 mg Oral BID  . heparin  5,000 Units Subcutaneous Q8H  . hydrALAZINE  25 mg Oral Q8H  . [[facility] HOLD] insulin aspart  0-5 Units Subcutaneous QHS  . [[facility] HOLD] insulin aspart  0-9 Units Subcutaneous TID WC  . [[facility] HOLD] insulin glargine  15 Units Subcutaneous QHS  . [[facility] HOLD] isosorbide mononitrate  30 mg Oral Daily  .  [[facility] HOLD] multivitamin with minerals  1 tablet Oral Daily  . [[facility] HOLD] pantoprazole  40 mg Oral BID  . [[facility] HOLD] senna  1 tablet Oral BID  . [facility] HOLD] simvastatin  20 mg Oral QPM  . [[facility] HOLD] sodium chloride  3 mL Intravenous Q12H  . sodium chloride  3 mL Intravenous Q12H  . [[facility] HOLD] sodium phosphate  1 enema Rectal Once  . [[facility] HOLD] sucralfate  1 g Oral TID PC & HS    Infusions: . sodium chloride    . sodium chloride      PRN Medications: sodium chloride, acetaminophen, [[facility] HOLD] albuterol, [[facility] HOLD] ondansetron (ZOFRAN) IV, ondansetron (ZOFRAN) IV, [[facility] HOLD] ondansetron, [[facility] HOLD] oxyCODONE, [[facility] HOLD] polyethylene glycol, sodium chloride   Assessment/Plan:   1. 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  PCWP 21 with RA pressure 7 yesterday on cath.  - Restart po lasix 60 mg BID today. - Creatinine returning to admit baseline although unsure what his true baseline really is therefore will stop milrinone today.  Continue afterload reduction with hydralazine/imdur, increase hydralazine to 25 mg tid. - Continue Coreg at current dose, No ACEI with renal dysfunction.  2. CAD: S/p CABG and subsequent PCI. No chest pain, negative cardiac enzymes. Continue ASA and statin.  3. CKD: Unsure baseline creatinine although it was 1.5 in 10/2011.  - Creatinine returning to admit value.  Will monitor off milrinone.   4. Anemia: ? Renal disease/chronic disease but FOBT+. Hgb stable today.  History of GAVE.  Plan for upper GI today. 5. Cirrhosis: Liver looks cirrhotic on ultrasound, lesion noted that needs evaluation by MRI/AFP for HCC.  Large volume paracentesis.  Filling pressures not severely elevated on RHC, do not think that we can blame cirrhosis/ascites totally on hepatic congestion. He has said that he did not drink.  ? NASH. Need to rule out HCV, HBV.  - Check hepatitis panel, AFP - Would favor paracentesis => discuss with GI - Needs MRI abdomen to  evaluate liver => discuss with GI  Marca Ancona 09/07/2012 9:55 AM  Length of Stay: 6

## 2012-09-07 NOTE — Op Note (Signed)
[facility] [address] [place], [address]   ENDOSCOPY PROCEDURE REPORT  PATIENT: Raymond, Woodard  MR#: [id number] BIRTHDATE: 04-15-1946 , 66  yrs. old GENDER: Male ENDOSCOPIST: Beverley Fiedler, MD REFERRED BY:  Laurey Morale. PROCEDURE DATE:  09/07/2012 PROCEDURE:  EGD, diagnostic ASA CLASS:     Class III INDICATIONS:  Anemia.   Heme positive stool.   history of GAVE. MEDICATIONS: These medications were titrated to patient response per physician's verbal order, Fentanyl-Detailed 50 mg IV, Versed, and Versed 4 mg IV TOPICAL ANESTHETIC: Cetacaine Spray  DESCRIPTION OF PROCEDURE: After the risks benefits and alternatives of the procedure were thoroughly explained, informed consent was obtained.  The Pentax Gastroscope [id number] endoscope was introduced through the mouth and advanced to the second portion of the duodenum. Without limitations.  The instrument was slowly withdrawn as the mucosa was fully examined.   ESOPHAGUS: Some minimal food debris, but otherwise normal esophageal mucosa.  No esophageal varices seen.  STOMACH: Large amount of food residue was found in the gastric body and gastric antrum which significantly impaired visualization of the mucosa.   In the antrum, vascular ectasias consistent with GAVE was seen.  No active bleeding.  DUODENUM: The duodenal mucosa showed no abnormalities in the bulb and second portion of the duodenum.  Retroflexion was not performed due to the presence of retained food.  The scope was then withdrawn from the patient and the procedure completed.  COMPLICATIONS: There were no complications.  ENDOSCOPIC IMPRESSION: 1.   Some minimal food debris, but otherwise normal esophageal mucosa.  No esophageal varices seen. 2.   Food in the gastric body and gastric antrum impairing visualization, suggesting gastroparesis 3.   Scattered vascular ectasias in the gastric antrum, incompletely visualized due to  #2 4.   The duodenal mucosa showed no abnormalities in the bulb and second portion of the duodenum  RECOMMENDATIONS: 1.  Clear liquid diet for dinner tonight.  Three doses of metoclopramide today in attempt to empty stomach of retained contents for EGD tomorrow 2.  Continue BID PPI and sucralfate QID   eSigned:  Beverley Fiedler, MD 09/07/2012 10:31 AM

## 2012-09-08 ENCOUNTER — Encounter (HOSPITAL_COMMUNITY)
Admission: EM | Disposition: A | Discharge status: Home / Self Care | Payer: Self-pay | Source: Other Acute Inpatient Hospital | Attending: Internal Medicine

## 2012-09-08 ENCOUNTER — Encounter (HOSPITAL_COMMUNITY): Payer: Self-pay | Admitting: Internal Medicine

## 2012-09-08 HISTORY — PX: ESOPHAGOGASTRODUODENOSCOPY: SHX5428

## 2012-09-08 LAB — BASIC METABOLIC PANEL
BUN: 67 mg/dL — ABNORMAL HIGH (ref 6–23)
CO2: 29 mEq/L (ref 19–32)
Chloride: 93 mEq/L — ABNORMAL LOW (ref 96–112)
Creatinine, Ser: 2.18 mg/dL — ABNORMAL HIGH (ref 0.50–1.35)

## 2012-09-08 LAB — HEPATITIS PANEL, ACUTE
HCV Ab: NEGATIVE
Hep A IgM: NEGATIVE
Hep B C IgM: NEGATIVE
Hepatitis B Surface Ag: NEGATIVE

## 2012-09-08 LAB — PROTIME-INR: Prothrombin Time: 12.8 seconds (ref 11.6–15.2)

## 2012-09-08 LAB — HEPATIC FUNCTION PANEL
AST: 18 U/L (ref 0–37)
Albumin: 2.9 g/dL — ABNORMAL LOW (ref 3.5–5.2)
Total Bilirubin: 0.5 mg/dL (ref 0.3–1.2)

## 2012-09-08 LAB — GLUCOSE, CAPILLARY
Glucose-Capillary: 142 mg/dL — ABNORMAL HIGH (ref 70–99)
Glucose-Capillary: 203 mg/dL — ABNORMAL HIGH (ref 70–99)

## 2012-09-08 LAB — CBC
HCT: 28.5 % — ABNORMAL LOW (ref 39.0–52.0)
MCV: 85.6 fL (ref 78.0–100.0)
Platelets: 327 10*3/uL (ref 150–400)
RBC: 3.33 MIL/uL — ABNORMAL LOW (ref 4.22–5.81)
WBC: 8.1 10*3/uL (ref 4.0–10.5)

## 2012-09-08 SURGERY — EGD (ESOPHAGOGASTRODUODENOSCOPY)
Anesthesia: Moderate Sedation

## 2012-09-08 MED ORDER — FENTANYL CITRATE 0.05 MG/ML IJ SOLN
INTRAMUSCULAR | Status: AC
  Filled 2012-09-08: qty 4

## 2012-09-08 MED ORDER — FENTANYL CITRATE 0.05 MG/ML IJ SOLN
INTRAMUSCULAR | Status: DC | PRN
  Administered 2012-09-08 (×3): 25 ug via INTRAVENOUS

## 2012-09-08 MED ORDER — MIDAZOLAM HCL 10 MG/2ML IJ SOLN
INTRAMUSCULAR | Status: DC | PRN
  Administered 2012-09-08 (×2): 2 mg via INTRAVENOUS
  Administered 2012-09-08 (×2): 1 mg via INTRAVENOUS

## 2012-09-08 MED ORDER — MIDAZOLAM HCL 5 MG/ML IJ SOLN
INTRAMUSCULAR | Status: AC
  Filled 2012-09-08: qty 2

## 2012-09-08 NOTE — Progress Notes (Signed)
Advanced Heart Failure Rounding Note   Subjective:    67 yo with history of CAD s/p CABG and subsequent PCI, presumed diastolic CHF, and CKD presented with dyspnea/CHF exacerbation. Patient has been seen by Dr. Excell Seltzer in the past but currently seems to be getting most of his care down in [place].   3/17: cMRI showed LVEF 37% with mid to apical anterior, mid to apical anteroseptal and mid to apical inferior walls with mod to severe hypokinesis.  Normal RV.    Milrinone added 3/16 for worsening renal function.    RHC on Milrinone 0.25 RA mean 7  RV 36/7  PA 31/19, mean 23  PCWP mean 21  Oxygen saturations:  PA 65%  AO 98%  Cardiac Output (Fick) 7.1  Cardiac Index (Fick) 3.66  Cardiac Output (Thermodilution) 4.81  Cardiac Index (Thermodilution) 2.48   Abd u/s: cirrhosis.  Cholelithiasis without cholecystitis.  Mixed echogenicity of posterior aspect of right liver lobe suggestive of ascites. Large volume intra-abdominal ascites   S/p paracentesis yesterday yielding 6 L of fluid.  Cr improving with milrinone and post paracentesis. Reviewed EGD.  Groggy post procedure.  No dyspnea.    Objective:     Vital Signs:   Temp:  [98.9 F (37.2 C)-100.7 F (38.2 C)] 98.9 F (37.2 C) (03/21 1326) Pulse Rate:  [48-97] 97 (03/21 0519) Resp:  [6-25] 21 (03/21 1355) BP: (77-135)/(45-77) 96/56 mmHg (03/21 1355) SpO2:  [91 %-100 %] 91 % (03/21 1355) Weight:  [169 lb 5 oz (76.8 kg)] 169 lb 5 oz (76.8 kg) (03/21 0433) Last BM Date: 09/07/12  Weight change: Filed Weights   09/06/12 0355 09/07/12 0634 09/08/12 0433  Weight: 181 lb (82.101 kg) 182 lb 12.8 oz (82.918 kg) 169 lb 5 oz (76.8 kg)    Intake/Output:   Intake/Output Summary (Last 24 hours) at 09/08/12 1655 Last data filed at 09/08/12 0700  Gross per 24 hour  Intake    390 ml  Output      0 ml  Net    390 ml     Physical Exam: General:  Chronically ill appearing. No resp difficulty HEENT: normal Neck: supple. JVP 6-7.  Carotids 2+ bilat; no bruits. No lymphadenopathy or thryomegaly appreciated. Cor: PMI nondisplaced. Regular rate & rhythm. 2/6 systolic murmur. Lungs: clear Abdomen: soft, nontender, nondistended. No hepatosplenomegaly. No bruits or masses. Good bowel sounds. Extremities: no cyanosis, clubbing, rash, tr lower extremity edema Neuro: alert & orientedx3, cranial nerves grossly intact. moves all 4 extremities w/o difficulty. Affect pleasant  Telemetry: SR 70-80s  Labs: Basic Metabolic Panel:  Recent Labs Lab 09/05/12 0550 09/06/12 0500 09/06/12 1614 09/07/12 0505 09/08/12 0438  NA 132* 133* 132* 131* 132*  K 4.1 3.5 3.4* 4.2 3.8  CL 91* 91* 92* 92* 93*  CO2 28 31 29 28 29   GLUCOSE 132* 153* 197* 193* 124*  BUN 76* 77* 74* 75* 67*  CREATININE 2.71* 2.75* 2.54*  2.52* 2.49* 2.18*  CALCIUM 8.7 8.8 8.7 8.8 8.8    Liver Function Tests:  Recent Labs Lab 09/08/12 0438  AST 18  ALT 9  ALKPHOS 102  BILITOT 0.5  PROT 6.1  ALBUMIN 2.9*   No results found for this basename: LIPASE, AMYLASE,  in the last 168 hours No results found for this basename: AMMONIA,  in the last 168 hours  CBC:  Recent Labs Lab 09/05/12 1601 09/06/12 0500 09/06/12 1614 09/07/12 0505 09/08/12 0438  WBC 6.2 5.6 5.5 5.8 8.1  HGB 7.8* 8.1*  8.4* 8.4* 9.5*  HCT 23.7* 24.3* 25.9* 25.5* 28.5*  MCV 86.8 85.3 86.9 85.3 85.6  PLT 271 250 264 281 327    Cardiac Enzymes: No results found for this basename: CKTOTAL, CKMB, CKMBINDEX, TROPONINI,  in the last 168 hours  BNP: BNP (last 3 results)  Recent Labs  09/01/12 0430  PROBNP 10584.0*      Imaging: US Paracentesis  09/07/2012  *RADIOLOGY REPORT*  Clinical Data: CHF, chronic kidney disease, cirrhosis, ascites. Request is made for diagnostic and therapeutic paracentesis up to 6 liters.  ULTRASOUND GUIDED DIAGNOSTIC AND THERAPEUTIC  PARACENTESIS  An ultrasound guided paracentesis was thoroughly discussed with the patient and questions answered.   The benefits, risks, alternatives and complications were also discussed.  The patient understands and wishes to proceed with the procedure.  Written consent was obtained.  Ultrasound was performed to localize and mark an adequate pocket of fluid in the right lower quadrant of the abdomen.  The area was then prepped and draped in the normal sterile fashion.  1% Lidocaine was used for local anesthesia.  Under ultrasound guidance a 19 gauge Yueh catheter was introduced.  Paracentesis was performed.  The catheter was removed and a dressing applied.  Complications:  none  Findings:  A total of approximately 6 liters of clear, yellow fluid was removed.  A fluid sample was sent for laboratory analysis.  IMPRESSION: Successful ultrasound guided diagnostic and therapeutic paracentesis yielding 6 liters of ascites.  Read by: Jeananne Rama, P.A.-C   Original Report Authenticated By: Tacey Ruiz, MD      Medications:     Scheduled Medications: . aspirin  81 mg Oral Daily  . carvedilol  3.125 mg Oral BID WC  . furosemide  60 mg Oral BID  . heparin  5,000 Units Subcutaneous Q8H  . hydrALAZINE  25 mg Oral Q8H  . insulin aspart  0-5 Units Subcutaneous QHS  . insulin aspart  0-9 Units Subcutaneous TID WC  . insulin glargine  15 Units Subcutaneous QHS  . isosorbide mononitrate  30 mg Oral Daily  . multivitamin with minerals  1 tablet Oral Daily  . pantoprazole  40 mg Oral BID  . senna  1 tablet Oral BID  . simvastatin  20 mg Oral QPM  . sodium chloride  3 mL Intravenous Q12H  . sodium chloride  3 mL Intravenous Q12H  . sodium phosphate  1 enema Rectal Once  . sucralfate  1 g Oral TID PC & HS    Infusions: . sodium chloride    . sodium chloride 20 mL/hr at 09/08/12 0533  . sodium chloride      PRN Medications: sodium chloride, acetaminophen, albuterol, ondansetron (ZOFRAN) IV, ondansetron (ZOFRAN) IV, ondansetron, oxyCODONE, polyethylene glycol, sodium chloride   Assessment/Plan:   1. Acute on  chronic mixed systolic and diastolic HF: cMRI showed EF 37%.  PCWP 21 with RA pressure 7 yesterday on cath.  - Continue po lasix 60 mg BID today. - Creatinine and weight much improved post paracentesis => suspect due to lower abdominal pressure and renal venous pressure.    - Will hold off on titration today as BP soft with sedation yesterday and today.  Continue hydralazine/imdur and carvedilol at current dose.  No ACE-I due renal dysfunction. 2. CAD: S/p CABG and subsequent PCI. No chest pain, negative cardiac enzymes. Continue ASA and statin.  3. CKD: Unsure baseline creatinine although it was 1.5 in 10/2011.  - Creatinine returning to admit value.  4. Anemia: EGD today with APC to GAVE, followed by renal.   -  Continue Iron supplementation 5. Cirrhosis: Liver looks cirrhotic on ultrasound, lesion noted that needs evaluation by MRI/AFP for HCC.  S/p paracentesis which yields 6 L of fluid.  Hepatitis panel and AFP negative.    Suspect he may be able to go home Sunday or Monday.  Marca Ancona 09/08/2012 4:56 PM  Length of Stay: 7

## 2012-09-08 NOTE — Progress Notes (Addendum)
Cytology returned on ascitic fluid -- negative for malignancy, reactive cells present. EGD today with APC to GAVE After discussion with radiology regarding abnormality/lesion in the liver parenchyma seen by ultrasound, we will order CT scan of the abdomen and pelvis to examine this area in cross-section. Given his current GFR CT without contrast is felt better per radiology then an MRI without contrast. Reassuringly his alpha-fetoprotein was normal. Will follow with him tomorrow

## 2012-09-08 NOTE — Op Note (Signed)
[name] [address] [place], [address]   ENDOSCOPY PROCEDURE REPORT  PATIENT: Raymond, Woodard  MR#: [id number] BIRTHDATE: 06-17-1946 , 66  yrs. old GENDER: Male ENDOSCOPIST: Beverley Fiedler, MD REFERRED BY:  Laurey Morale. PROCEDURE DATE:  09/08/2012 PROCEDURE:  EGD w/ ablation of gastric antral vascular ectasia ASA CLASS:     Class III INDICATIONS:  Anemia.   Heme positive stool.   known GAVE. MEDICATIONS: These medications were titrated to patient response per physician's verbal order, Fentanyl 75 mcg IV, and Versed 6 mg IV TOPICAL ANESTHETIC: Cetacaine Spray  DESCRIPTION OF PROCEDURE: After the risks benefits and alternatives of the procedure were thoroughly explained, informed consent was obtained.  The Pentax Gastroscope [id number] endoscope was introduced through the mouth and advanced to the second portion of the duodenum. Without limitations.  The instrument was slowly withdrawn as the mucosa was fully examined.    ESOPHAGUS: The mucosa of the esophagus appeared normal.  STOMACH: Mild gastropathy characterized by erythema was found in the cardia and gastric fundus extending to Z-line on gastric side.   A suture was visible in the gastric fundus from prior intervention. Vascular ectasisa consistent with prior history of GAVE was observed in the gastric antrum and distal gastric body.  APC was used, 1.0 L/minute at 40 W, to ablate the vascular ectasias.  This procedure was successful in treating a large portion of the affected areas.  DUODENUM: The duodenal mucosa showed no abnormalities in the bulb and second portion of the duodenum.  Retroflexed views revealed see above.     The scope was then withdrawn from the patient and the procedure completed.  COMPLICATIONS: There were no complications.  ENDOSCOPIC IMPRESSION: 1.   The mucosa of the esophagus appeared normal 2.   Gastropathy was found in the cardia and gastric fundus 3.    Previous surgical intervention with suture was found in the gastric fundus 4.   Non-bleeding GAVE, treated successfully with APC 5.   The duodenal mucosa showed no abnormalities in the bulb and second portion of the duodenum  RECOMMENDATIONS: 1.  Continue carafate 1 gram three times daily before meals and at bedtime 2.  Continue twice daily PPI 3.  Will likely require repeat EGD with APC for further ablation of GAVE in 3-4 weeks 4.  Monitor Hgb/HCT, continue iron supplementation  eSigned:  Beverley Fiedler, MD 09/08/2012 1:23 PM   CC:The Patient  PATIENT NAME:  Raymond, Woodard MR#: [id number]

## 2012-09-08 NOTE — Progress Notes (Signed)
PT Cancellation Note  Patient Details Name: [name] MRN: [id number] DOB: 1945/10/16   Cancelled Treatment:    Reason Eval/Treat Not Completed: Patient at procedure or test/unavailable. Pt at EGD this afternoon. 09/08/2012  [name], PT [phone] (pager)   Maripat Borba, Eliseo Gum 09/08/2012, 3:36 PM

## 2012-09-08 NOTE — Progress Notes (Addendum)
[facility] PROGRESS NOTE  HARTMAN MINAHAN NWG:[id number] DOB: 04/24/1946 DOA: 09/01/2012 PCP: Dina Rich, MD  Brief Narrative:  67 y.o. male with history of type II DM, HTN, hyperlipidemia, CAD status post CABG and stents, chronic kidney disease with baseline creatinine 1.9, chronic systolic CHF (last EF said to be 40%) and GERD was transferred from [facility] ED to [facility] for further evaluation and management of worsening dyspnea and leg swelling. Patient gives a 2 week history of gradually worsening bilateral lower extremity swelling, abdominal distention and DOE. He saw his primary cardiologist last week and was placed on high dose Lasix for a few days. Patient's symptoms initially improved but in the last 3 or 4 days, he is noted worsening DOE when he walks a few steps. He denies dyspnea at rest, chest pain, palpitations. He normally uses 2 pillows which has not changed. He has noticed worsening leg swelling. He presented to the [facility] ED where hemoglobin was 7 g/dL (patient states that recently his hemoglobin has been 9.1 and 8.4 g/dL) and creatinine of 2.5. His case was discussed with the hospitalist and cardiologist there who advised transfer to tertiary hospital. Patient denies any bleeding, vomiting or melena. Patient was transfused 1 unit of PRBCs on route to [facility].   Assessment/Plan:   Acute on chronic systolic CHF:  Admited to telemetry. 2-D echocardiogram done 3/14, poor windows. Cardiology consulted. - MRI shows EF 37% . Restarted on Lasix 60 mg twice a day creatinine slowly improving. Continue hydralazine and Imdur for afterload reduction. -Monitor daily weights and I./O. negative by 3 L since admission. clinically  much improved.  Right pleural effusion:  Likely related to a fusion from CHF. Continue diuresis  Anemia:  [place] [facility] consulted. EGD on 3/20 with poor visualization due to food in stomach ?gastroparesis  -EGD repeated today  which showed  nonbleeding GAVE an APC done. recommend Carafate and PPI twice a day.  Monitor serial H&H.  Acute on chronic kidney disease:  Likely secondary to cardiorenal syndrome. Monitor  Cirrhosis  - decompensated given ascites.  no varices seen on endoscopy. Paracentesis done on 320 with 6 L of clear yellow fluid of pain. Showed some reactive cells but negative for malignancy. AFB negative. Acute hepatitis panel pending. Ultrasound liver showed " ill-defined area of mixed echogenicity involving the posterior aspect of right lower lobe of liver. " Recommended getting a CT scan of of the abdomen without contrast. LFTs normal.  Type II-diabetes mellitus Continue Lantus and sliding scale insulin  Hypertension Continue Coreg   Chronic low back pain Continue pain medications  Fever Temperature of 100.38F , this morning. No other signs of infection. We'll monitor  Code Status: Full  Family Communication: none  at bedside  Disposition Plan:  currently inpatient  Consultants:  Cardiology  GI Procedures:  2D echo    HPI/Subjective: She seen and examined after returning from endoscopy. Denies any symptoms  Objective: Filed Vitals:   09/08/12 1330 09/08/12 1335 09/08/12 1346 09/08/12 1355  BP: 78/53 102/66 90/56 96/56   Pulse:      Temp:      TempSrc:      Resp: 19 20 19 21   Height:      Weight:      SpO2: 99% 92% 91% 91%    Intake/Output Summary (Last 24 hours) at 09/08/12 1642 Last data filed at 09/08/12 0700  Gross per 24 hour  Intake    390 ml  Output  0 ml  Net    390 ml   Filed Weights   09/06/12 0355 09/07/12 0634 09/08/12 0433  Weight: 82.101 kg (181 lb) 82.918 kg (182 lb 12.8 oz) 76.8 kg (169 lb 5 oz)    Exam:   General:  Very thin built male in no acute distress  HEENT: no Pallor, moist oral mucosa  Cardiovascular: Normal S1 and S2, no murmurs rub or gallop  Respiratory: Clear to auscultation bilaterally, no added sounds  Abdomen:,, soft,  Nontender, nondistended, bowel sounds present  Musculoskeletal: Warm, no edema  CNS: AAO x3  Data Reviewed: Basic Metabolic Panel:  Recent Labs Lab 09/05/12 0550 09/06/12 0500 09/06/12 1614 09/07/12 0505 09/08/12 0438  NA 132* 133* 132* 131* 132*  K 4.1 3.5 3.4* 4.2 3.8  CL 91* 91* 92* 92* 93*  CO2 28 31 29 28 29   GLUCOSE 132* 153* 197* 193* 124*  BUN 76* 77* 74* 75* 67*  CREATININE 2.71* 2.75* 2.54*  2.52* 2.49* 2.18*  CALCIUM 8.7 8.8 8.7 8.8 8.8   Liver Function Tests:  Recent Labs Lab 09/08/12 0438  AST 18  ALT 9  ALKPHOS 102  BILITOT 0.5  PROT 6.1  ALBUMIN 2.9*   No results found for this basename: LIPASE, AMYLASE,  in the last 168 hours No results found for this basename: AMMONIA,  in the last 168 hours CBC:  Recent Labs Lab 09/05/12 1601 09/06/12 0500 09/06/12 1614 09/07/12 0505 09/08/12 0438  WBC 6.2 5.6 5.5 5.8 8.1  HGB 7.8* 8.1* 8.4* 8.4* 9.5*  HCT 23.7* 24.3* 25.9* 25.5* 28.5*  MCV 86.8 85.3 86.9 85.3 85.6  PLT 271 250 264 281 327   Cardiac Enzymes: No results found for this basename: CKTOTAL, CKMB, CKMBINDEX, TROPONINI,  in the last 168 hours BNP (last 3 results)  Recent Labs  09/01/12 0430  PROBNP 10584.0*   CBG:  Recent Labs Lab 09/07/12 1604 09/07/12 2138 09/08/12 0609 09/08/12 1152 09/08/12 1613  GLUCAP 207* 148* 135* 142* 203*    No results found for this or any previous visit (from the past 240 hour(s)).   Studies: US Paracentesis  09/07/2012  *RADIOLOGY REPORT*  Clinical Data: CHF, chronic kidney disease, cirrhosis, ascites. Request is made for diagnostic and therapeutic paracentesis up to 6 liters.  ULTRASOUND GUIDED DIAGNOSTIC AND THERAPEUTIC  PARACENTESIS  An ultrasound guided paracentesis was thoroughly discussed with the patient and questions answered.  The benefits, risks, alternatives and complications were also discussed.  The patient understands and wishes to proceed with the procedure.  Written consent was  obtained.  Ultrasound was performed to localize and mark an adequate pocket of fluid in the right lower quadrant of the abdomen.  The area was then prepped and draped in the normal sterile fashion.  1% Lidocaine was used for local anesthesia.  Under ultrasound guidance a 19 gauge Yueh catheter was introduced.  Paracentesis was performed.  The catheter was removed and a dressing applied.  Complications:  none  Findings:  A total of approximately 6 liters of clear, yellow fluid was removed.  A fluid sample was sent for laboratory analysis.  IMPRESSION: Successful ultrasound guided diagnostic and therapeutic paracentesis yielding 6 liters of ascites.  Read by: Jeananne Rama, P.A.-C   Original Report Authenticated By: Tacey Ruiz, MD     Scheduled Meds: . aspirin  81 mg Oral Daily  . carvedilol  3.125 mg Oral BID WC  . furosemide  60 mg Oral BID  . heparin  5,000  Units Subcutaneous Q8H  . hydrALAZINE  25 mg Oral Q8H  . insulin aspart  0-5 Units Subcutaneous QHS  . insulin aspart  0-9 Units Subcutaneous TID WC  . insulin glargine  15 Units Subcutaneous QHS  . isosorbide mononitrate  30 mg Oral Daily  . multivitamin with minerals  1 tablet Oral Daily  . pantoprazole  40 mg Oral BID  . senna  1 tablet Oral BID  . simvastatin  20 mg Oral QPM  . sodium chloride  3 mL Intravenous Q12H  . sodium chloride  3 mL Intravenous Q12H  . sodium phosphate  1 enema Rectal Once  . sucralfate  1 g Oral TID PC & HS   Continuous Infusions: . sodium chloride    . sodium chloride 20 mL/hr at 09/08/12 0533  . sodium chloride        Time spent: 25 minutes    Namiyah Grantham  [facility] Pager [phone] If 7PM-7AM, please contact night-coverage at [email or web address], password [facility] 09/08/2012, 4:42 PM  LOS: 7 days

## 2012-09-09 ENCOUNTER — Inpatient Hospital Stay (HOSPITAL_COMMUNITY): Payer: PRIVATE HEALTH INSURANCE

## 2012-09-09 DIAGNOSIS — K7460 Unspecified cirrhosis of liver: Secondary | ICD-10-CM

## 2012-09-09 HISTORY — DX: Unspecified cirrhosis of liver: K74.60

## 2012-09-09 LAB — CBC
MCHC: 33.1 g/dL (ref 30.0–36.0)
RDW: 15.6 % — ABNORMAL HIGH (ref 11.5–15.5)

## 2012-09-09 LAB — BASIC METABOLIC PANEL
Calcium: 8.5 mg/dL (ref 8.4–10.5)
GFR calc Af Amer: 34 mL/min — ABNORMAL LOW (ref >90)
GFR calc non Af Amer: 30 mL/min — ABNORMAL LOW (ref >90)
Potassium: 3.4 mEq/L — ABNORMAL LOW (ref 3.5–5.1)
Sodium: 133 mEq/L — ABNORMAL LOW (ref 135–145)

## 2012-09-09 LAB — GLUCOSE, CAPILLARY
Glucose-Capillary: 139 mg/dL — ABNORMAL HIGH (ref 70–99)
Glucose-Capillary: 186 mg/dL — ABNORMAL HIGH (ref 70–99)

## 2012-09-09 MED ORDER — POTASSIUM CHLORIDE CRYS ER 20 MEQ PO TBCR
40.0000 meq | EXTENDED_RELEASE_TABLET | Freq: Once | ORAL | Status: AC
  Administered 2012-09-09: 40 meq via ORAL
  Filled 2012-09-09: qty 2

## 2012-09-09 MED ORDER — SODIUM CHLORIDE 0.9 % IJ SOLN: 3.0000 mL | INTRAMUSCULAR | Status: DC | PRN

## 2012-09-09 MED ORDER — POTASSIUM CHLORIDE CRYS ER 20 MEQ PO TBCR
EXTENDED_RELEASE_TABLET | ORAL | Status: AC
  Filled 2012-09-09: qty 1

## 2012-09-09 MED ORDER — SODIUM CHLORIDE 0.9 % IJ SOLN
3.0000 mL | Freq: Two times a day (BID) | INTRAMUSCULAR | Status: DC
  Administered 2012-09-09: 3 mL via INTRAVENOUS

## 2012-09-09 MED ORDER — IOHEXOL 300 MG/ML  SOLN
25.0000 mL | INTRAMUSCULAR | Status: AC
  Administered 2012-09-09 (×2): 25 mL via ORAL

## 2012-09-09 NOTE — Progress Notes (Signed)
[facility] PROGRESS NOTE  MASAICHI KRACHT ZOX:[id number] DOB: 1946/06/09 DOA: 09/01/2012 PCP: Dina Rich, MD  Brief Narrative:  67 y.o. male with history of type II DM, HTN, hyperlipidemia, CAD status post CABG and stents, chronic kidney disease with baseline creatinine 1.9, chronic systolic CHF (last EF said to be 40%) and GERD was transferred from [facility] ED to [facility] for further evaluation and management of worsening dyspnea and leg swelling. Patient gives a 2 week history of gradually worsening bilateral lower extremity swelling, abdominal distention and DOE. He saw his primary cardiologist last week and was placed on high dose Lasix for a few days. Patient's symptoms initially improved but in the last 3 or 4 days, he is noted worsening DOE when he walks a few steps. He denies dyspnea at rest, chest pain, palpitations. He normally uses 2 pillows which has not changed. He has noticed worsening leg swelling. He presented to the [facility] ED where hemoglobin was 7 g/dL (patient states that recently his hemoglobin has been 9.1 and 8.4 g/dL) and creatinine of 2.5. His case was discussed with the hospitalist and cardiologist there who advised transfer to tertiary hospital. Patient denies any bleeding, vomiting or melena. Patient was transfused 1 unit of PRBCs on route to [facility].   Assessment/Plan:  Acute on chronic systolic CHF:  Admited to telemetry. 2-D echocardiogram done 3/14, poor windows. Cardiology consulted.  - MRI shows EF 37% . Restarted on Lasix 60 mg twice a day creatinine slowly improving. Continue hydralazine and Imdur for afterload reduction.  -Monitor daily weights and I./O. negative by 3 L since admission. clinically much improved after paracentesis  Right pleural effusion:  Likely related to  CHF. Symptoms better. Continue diuresis   Anemia:  [place] [facility] consulted. EGD on 3/20 with poor visualization due to food in stomach ?gastroparesis   -EGD repeated today which showed nonbleeding GAVE an APC done. recommend Carafate and PPI twice a day. Monitor serial H&H.   Acute on chronic kidney disease:  Likely secondary to cardiorenal syndrome. Monitor   Cirrhosis  - decompensated given ascites. no varices seen on endoscopy. Paracentesis done on 320 with 6 L of clear yellow fluid of pain. Showed some reactive cells but negative for malignancy. AFB negative. Acute hepatitis panel pending. Ultrasound liver showed " ill-defined area of mixed echogenicity involving the posterior aspect of right lower lobe of liver. " Recommended getting a CT scan of of the abdomen without contrast.  LFTs normal.   Type II-diabetes mellitus  Continue Lantus and sliding scale insulin   Hypertension  Continue Coreg   Chronic low back pain  Continue pain medications    Code Status: Full  Family Communication: none at bedside  Disposition Plan: currently inpatient   Consultants:  Cardiology  GI Procedures:  2D echo  EGD on 3/20 and 3/21   HPI/Subjective:  Patient seen and examined . Feels much better.    Objective: Filed Vitals:   09/08/12 1355 09/08/12 1717 09/08/12 1912 09/09/12 0409  BP: 96/56 117/74 112/62 118/66  Pulse:   92 90  Temp:   98.9 F (37.2 C) 98.7 F (37.1 C)  TempSrc:   Oral Oral  Resp: 21  20 18   Height:      Weight:    77.6 kg (171 lb 1.2 oz)  SpO2: 91%  98% 94%    Intake/Output Summary (Last 24 hours) at 09/09/12 1234 Last data filed at 09/09/12 1100  Gross per 24 hour  Intake  1930 ml  Output    351 ml  Net   1579 ml   Filed Weights   09/07/12 0634 09/08/12 0433 09/09/12 0409  Weight: 82.918 kg (182 lb 12.8 oz) 76.8 kg (169 lb 5 oz) 77.6 kg (171 lb 1.2 oz)    Exam:  General: Very thin built male in no acute distress  HEENT: no Pallor, moist oral mucosa  Cardiovascular: Normal S1 and S2, no murmurs rub or gallop  Respiratory: Clear to auscultation bilaterally, no added sounds  Abdomen:,, soft,  Nontender, nondistended, bowel sounds present  Musculoskeletal: Warm, no edema  CNS: AAO x3   Data Reviewed: Basic Metabolic Panel:  Recent Labs Lab 09/06/12 0500 09/06/12 1614 09/07/12 0505 09/08/12 0438 09/09/12 0442  NA 133* 132* 131* 132* 133*  K 3.5 3.4* 4.2 3.8 3.4*  CL 91* 92* 92* 93* 93*  CO2 31 29 28 29 30   GLUCOSE 153* 197* 193* 124* 153*  BUN 77* 74* 75* 67* 68*  CREATININE 2.75* 2.54*  2.52* 2.49* 2.18* 2.21*  CALCIUM 8.8 8.7 8.8 8.8 8.5   Liver Function Tests:  Recent Labs Lab 09/08/12 0438  AST 18  ALT 9  ALKPHOS 102  BILITOT 0.5  PROT 6.1  ALBUMIN 2.9*   No results found for this basename: LIPASE, AMYLASE,  in the last 168 hours No results found for this basename: AMMONIA,  in the last 168 hours CBC:  Recent Labs Lab 09/06/12 0500 09/06/12 1614 09/07/12 0505 09/08/12 0438 09/09/12 0442  WBC 5.6 5.5 5.8 8.1 8.5  HGB 8.1* 8.4* 8.4* 9.5* 8.4*  HCT 24.3* 25.9* 25.5* 28.5* 25.4*  MCV 85.3 86.9 85.3 85.6 86.4  PLT 250 264 281 327 239   Cardiac Enzymes: No results found for this basename: CKTOTAL, CKMB, CKMBINDEX, TROPONINI,  in the last 168 hours BNP (last 3 results)  Recent Labs  09/01/12 0430  PROBNP 10584.0*   CBG:  Recent Labs Lab 09/08/12 1152 09/08/12 1613 09/08/12 2056 09/09/12 0558 09/09/12 1104  GLUCAP 142* 203* 243* 139* 186*    No results found for this or any previous visit (from the past 240 hour(s)).   Studies: US Paracentesis  09/07/2012  *RADIOLOGY REPORT*  Clinical Data: CHF, chronic kidney disease, cirrhosis, ascites. Request is made for diagnostic and therapeutic paracentesis up to 6 liters.  ULTRASOUND GUIDED DIAGNOSTIC AND THERAPEUTIC  PARACENTESIS  An ultrasound guided paracentesis was thoroughly discussed with the patient and questions answered.  The benefits, risks, alternatives and complications were also discussed.  The patient understands and wishes to proceed with the procedure.  Written consent was  obtained.  Ultrasound was performed to localize and mark an adequate pocket of fluid in the right lower quadrant of the abdomen.  The area was then prepped and draped in the normal sterile fashion.  1% Lidocaine was used for local anesthesia.  Under ultrasound guidance a 19 gauge Yueh catheter was introduced.  Paracentesis was performed.  The catheter was removed and a dressing applied.  Complications:  none  Findings:  A total of approximately 6 liters of clear, yellow fluid was removed.  A fluid sample was sent for laboratory analysis.  IMPRESSION: Successful ultrasound guided diagnostic and therapeutic paracentesis yielding 6 liters of ascites.  Read by: Jeananne Rama, P.A.-C   Original Report Authenticated By: Tacey Ruiz, MD     Scheduled Meds: . aspirin  81 mg Oral Daily  . carvedilol  3.125 mg Oral BID WC  . furosemide  60 mg  Oral BID  . heparin  5,000 Units Subcutaneous Q8H  . hydrALAZINE  25 mg Oral Q8H  . insulin aspart  0-5 Units Subcutaneous QHS  . insulin aspart  0-9 Units Subcutaneous TID WC  . insulin glargine  15 Units Subcutaneous QHS  . isosorbide mononitrate  30 mg Oral Daily  . multivitamin with minerals  1 tablet Oral Daily  . pantoprazole  40 mg Oral BID  . potassium chloride SA      . senna  1 tablet Oral BID  . simvastatin  20 mg Oral QPM  . sodium chloride  3 mL Intravenous Q12H  . sodium chloride  3 mL Intravenous Q12H  . sodium chloride  3 mL Intravenous Q12H  . sodium phosphate  1 enema Rectal Once  . sucralfate  1 g Oral TID PC & HS   Continuous Infusions:     Time spent: 25 minutes    Jax Abdelrahman  [facility] Pager [phone] If 7PM-7AM, please contact night-coverage at [email or web address], password [facility] 09/09/2012, 12:34 PM  LOS: 8 days

## 2012-09-09 NOTE — Progress Notes (Signed)
Patient ID: [name], male   DOB: June 05, 1946, 67 y.o.   MRN: [id number]   SUBJECTIVE:  Patient is stable today. No shortness of breath.   Filed Vitals:   09/08/12 1355 09/08/12 1717 09/08/12 1912 09/09/12 0409  BP: 96/56 117/74 112/62 118/66  Pulse:   92 90  Temp:   98.9 F (37.2 C) 98.7 F (37.1 C)  TempSrc:   Oral Oral  Resp: 21  20 18   Height:      Weight:    171 lb 1.2 oz (77.6 kg)  SpO2: 91%  98% 94%    Intake/Output Summary (Last 24 hours) at 09/09/12 1058 Last data filed at 09/09/12 0957  Gross per 24 hour  Intake   1605 ml  Output    351 ml  Net   1254 ml    LABS: Basic Metabolic Panel:  Recent Labs  40/98/11 0438 09/09/12 0442  NA 132* 133*  K 3.8 3.4*  CL 93* 93*  CO2 29 30  GLUCOSE 124* 153*  BUN 67* 68*  CREATININE 2.18* 2.21*  CALCIUM 8.8 8.5   Liver Function Tests:  Recent Labs  09/08/12 0438  AST 18  ALT 9  ALKPHOS 102  BILITOT 0.5  PROT 6.1  ALBUMIN 2.9*   No results found for this basename: LIPASE, AMYLASE,  in the last 72 hours CBC:  Recent Labs  09/08/12 0438 09/09/12 0442  WBC 8.1 8.5  HGB 9.5* 8.4*  HCT 28.5* 25.4*  MCV 85.6 86.4  PLT 327 239   Cardiac Enzymes: No results found for this basename: CKTOTAL, CKMB, CKMBINDEX, TROPONINI,  in the last 72 hours BNP: No components found with this basename: POCBNP,  D-Dimer: No results found for this basename: DDIMER,  in the last 72 hours Hemoglobin A1C: No results found for this basename: HGBA1C,  in the last 72 hours Fasting Lipid Panel: No results found for this basename: CHOL, HDL, LDLCALC, TRIG, CHOLHDL, LDLDIRECT,  in the last 72 hours Thyroid Function Tests: No results found for this basename: TSH, T4TOTAL, FREET3, T3FREE, THYROIDAB,  in the last 72 hours  RADIOLOGY: US Abdomen Complete  09/06/2012  *RADIOLOGY REPORT*  Clinical Data:  Cirrhosis, ascites  COMPLETE ABDOMINAL ULTRASOUND  Comparison:  None.  Findings:  Gallbladder:  Multiple gallstones are seen  lying dependently within an otherwise normal-appearing gallbladder.  Negative sonographic Murphy's sign.  Common bile duct:  Normal in size measuring 4.6 mm in diameter.  Liver:  There is marked heterogeneity of the hepatic parenchyma. Note is made of a nodular contour of the liver edge. There is an ill-defined area of mixed echogenicity involving the posterior aspect of the right lobe of the liver (image 31).  No definite intrahepatic ductal dilatation.  Large volume intra-abdominal ascites.  IVC:  Obscured by bowel gas  Pancreas:  Obscured by bowel gas  Spleen:  Normal in size measuring 6.8 cm in length  Right Kidney:  Normal cortical thickness, echogenicity and size, measuring 9.8 cm in length.  No focal renal lesions.  No echogenic renal stones.  No urinary obstruction.  Left Kidney:  Normal cortical thickness, echogenicity and size, measuring 9.9 cm in length.  No focal renal lesions.  No echogenic renal stones.  No urinary obstruction.  Abdominal aorta:  Obscured by bowel gas.  IMPRESSION:  1.  Shrunken, nodular appearance of the liver with large volume ascites, findings compatible with provided history of cirrhosis.  2.  Ill-defined area of mixed echogenicity involving the posterior aspect  of the right lobe of the liver, suboptimally evaluated on this examination, though a discrete hepatic mass is not excluded. Further evaluation with alpha-fetoprotein level and abdominal MRI is recommended. 3.  Cholelithiasis without evidence of cholecystitis.  This was made a call report.   Original Report Authenticated By: Tacey Ruiz, MD    Dg Chest Right Decubitus  09/03/2012  *RADIOLOGY REPORT*  Clinical Data: Right pleural effusion.  CHEST - RIGHT DECUBITUS  Comparison: 09/01/2012  Findings: A right lateral decubitus view of the chest demonstrates no definite layering effusion. Right lateral basilar opacity is noted and could represent consolidation versus loculated pleural fluid.  IMPRESSION: No evidence of free  flowing effusion.  Right lateral basilar opacity again identified - question consolidation versus loculated pleural fluid.   Original Report Authenticated By: Harmon Pier, M.D.    US Paracentesis  09/07/2012  *RADIOLOGY REPORT*  Clinical Data: CHF, chronic kidney disease, cirrhosis, ascites. Request is made for diagnostic and therapeutic paracentesis up to 6 liters.  ULTRASOUND GUIDED DIAGNOSTIC AND THERAPEUTIC  PARACENTESIS  An ultrasound guided paracentesis was thoroughly discussed with the patient and questions answered.  The benefits, risks, alternatives and complications were also discussed.  The patient understands and wishes to proceed with the procedure.  Written consent was obtained.  Ultrasound was performed to localize and mark an adequate pocket of fluid in the right lower quadrant of the abdomen.  The area was then prepped and draped in the normal sterile fashion.  1% Lidocaine was used for local anesthesia.  Under ultrasound guidance a 19 gauge Yueh catheter was introduced.  Paracentesis was performed.  The catheter was removed and a dressing applied.  Complications:  none  Findings:  A total of approximately 6 liters of clear, yellow fluid was removed.  A fluid sample was sent for laboratory analysis.  IMPRESSION: Successful ultrasound guided diagnostic and therapeutic paracentesis yielding 6 liters of ascites.  Read by: Jeananne Rama, P.A.-C   Original Report Authenticated By: Tacey Ruiz, MD    Dg Chest Port 1 View  09/01/2012  *RADIOLOGY REPORT*  Clinical Data: CHF, cough, shortness of breath.  PORTABLE CHEST - 1 VIEW  Comparison: 10/12/2007  Findings: Prominent cardiomediastinal contours.  Central vascular congestion.  Moderate right pleural effusion and associated airspace opacity.  Mild left lung base opacity.  Degraded by rotation.  Status post median sternotomy and CABG.  Evidence of prior vertebroplasties.  No definite pneumothorax.  IMPRESSION: Right pleural effusion and associated  airspace opacity; atelectasis versus pneumonia.  Mild left lung base opacity, favor atelectasis.  Prominent cardiomediastinal contours status post median sternotomy and CABG.   Original Report Authenticated By: Jearld Lesch, M.D.    Dg Abd Portable 1v  09/01/2012  *RADIOLOGY REPORT*  Clinical Data: Recent capsule endoscopy.  Evaluate for retained capsule.  PORTABLE ABDOMEN - 1 VIEW  Comparison: None.  Findings: The bowel gas pattern is within normal limits.  No retained capsule camera identified.  Surgical clips and staples noted, as well as vascular calcifications.  IMPRESSION: No acute findings.  No retained capsule camera identified.   Original Report Authenticated By: Myles Rosenthal, M.D.    Mr Card Morphology W/o Cm  09/04/2012  *RADIOLOGY REPORT*  Clinical Data: Cardiomyopathy, unable to assess LV function by echo.  MR CARDIA MORPHOLOGY WITHOUT CONTRAST  GE 1.5 T magnet with dedicated cardiac coil.  FIESTA sequences for function and morphology.  Free-breathing sequences were used.  No contrast was used due to renal dysfunction.  EF was calculated at a dedicated workstation.  Contrast:  Comparison: None.  Findings: Difficult study as patient had a hard time holding his breath.  Free-breathing sequences were used.  Normal left ventricular size and wall thickness.  The mid to apical anterior, mid to apical anteroseptal, and mid to apical inferior walls appeared moderately to severely hypokinetic. EF was calculated to be 37%.  Normal right ventricular size and systolic function. Moderate left atrial enlargement.  Mild to moderate right atrial enlargement.  There appeared to be mild mitral regurgitation though flow sequences to quantify were not done.  Trileaflet aortic valve without significant stenosis.  Measurements:  LV EDV 193 mL  LV SV 71 mL  LV EF 37%  IMPRESSION: Normal LV size with moderate systolic dysfunction, EF 37%.  Wall motion abnormalities as noted above.  Technically difficult study due to  poor breath-holding.   Original Report Authenticated By: Marca Ancona     PHYSICAL EXAM   Patient is oriented to person time and place. Lungs reveal a few scattered rhonchi. Cardiac exam reveals S1 and S2. There is no significant peripheral edema.   TELEMETRY:  I have reviewed telemetry today September 09, 2012. There is normal sinus rhythm.   ASSESSMENT AND PLAN:    Renal failure (ARF), acute on chronic    Creatinine is hovering in the same area. No change in therapy today.     CAD (coronary artery disease), S/P CABG & stents             Coronary disease is stable today. No change in therapy    Systolic CHF, acute on chronic      Overall volume status appears stable today. No change in therapy   Willa Rough 09/09/2012 10:58 AM

## 2012-09-09 NOTE — Progress Notes (Addendum)
[facility] Progress Note  Subjective: Patient states he feels well. Denies abdominal pain, overall feels much better after 6 L paracentesis Had CT scan of the abdomen and pelvis today, result pending Denies nausea and vomiting. Denies black stool after EGD with APC ablation yesterday  Objective:  Vital signs in last 24 hours: Temp:  [98.7 F (37.1 C)-98.9 F (37.2 C)] 98.7 F (37.1 C) (03/22 0409) Pulse Rate:  [90-92] 90 (03/22 0409) Resp:  [18-21] 18 (03/22 0409) BP: (90-118)/(56-74) 118/66 mmHg (03/22 0409) SpO2:  [91 %-98 %] 94 % (03/22 0409) Weight:  [171 lb 1.2 oz (77.6 kg)] 171 lb 1.2 oz (77.6 kg) (03/22 0409) Last BM Date: 09/07/12 General:   Awake, somewhat sleepy but alert, appearing older than stated age in no acute distress Heart:  Regular rate and rhythm, 2/6 SEM Abdomen:  Soft, nontender, minimal distention compared to pre-paracentesis  Normal bowel sounds   Extremities:  Trace LE edema, scattered upper extremity ecchymoses Neurologic:  Alert and  oriented x4 Psych:  Alert and cooperative. Normal mood and affect.  Intake/Output from previous day: 03/21 0701 - 03/22 0700 In: 1040 [P.O.:1040] Out: 251 [Urine:250; Stool:1] Intake/Output this shift: Total I/O In: 890 [P.O.:240; Other:650] Out: 100 [Urine:100]  Lab Results:  Recent Labs  09/07/12 0505 09/08/12 0438 09/09/12 0442  WBC 5.8 8.1 8.5  HGB 8.4* 9.5* 8.4*  HCT 25.5* 28.5* 25.4*  PLT 281 327 239   BMET  Recent Labs  09/07/12 0505 09/08/12 0438 09/09/12 0442  NA 131* 132* 133*  K 4.2 3.8 3.4*  CL 92* 93* 93*  CO2 28 29 30   GLUCOSE 193* 124* 153*  BUN 75* 67* 68*  CREATININE 2.49* 2.18* 2.21*  CALCIUM 8.8 8.8 8.5   LFT  Recent Labs  09/08/12 0438  PROT 6.1  ALBUMIN 2.9*  AST 18  ALT 9  ALKPHOS 102  BILITOT 0.5  BILIDIR 0.2  IBILI 0.3   PT/INR  Recent Labs  09/08/12 0438  LABPROT 12.8  INR 0.97   Hepatitis Panel  Recent Labs  09/07/12 1217  HEPBSAG  NEGATIVE  HCVAB NEGATIVE  HEPAIGM NEGATIVE  HEPBIGM NEGATIVE    Studies/Results: US Paracentesis  09/07/2012  *RADIOLOGY REPORT*  Clinical Data: CHF, chronic kidney disease, cirrhosis, ascites. Request is made for diagnostic and therapeutic paracentesis up to 6 liters.  ULTRASOUND GUIDED DIAGNOSTIC AND THERAPEUTIC  PARACENTESIS  An ultrasound guided paracentesis was thoroughly discussed with the patient and questions answered.  The benefits, risks, alternatives and complications were also discussed.  The patient understands and wishes to proceed with the procedure.  Written consent was obtained.  Ultrasound was performed to localize and mark an adequate pocket of fluid in the right lower quadrant of the abdomen.  The area was then prepped and draped in the normal sterile fashion.  1% Lidocaine was used for local anesthesia.  Under ultrasound guidance a 19 gauge Yueh catheter was introduced.  Paracentesis was performed.  The catheter was removed and a dressing applied.  Complications:  none  Findings:  A total of approximately 6 liters of clear, yellow fluid was removed.  A fluid sample was sent for laboratory analysis.  IMPRESSION: Successful ultrasound guided diagnostic and therapeutic paracentesis yielding 6 liters of ascites.  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  GAVE/heme+ stools and anemia -- GAVE successfully treated yesterday with APC.  Need to closely monitor hemoglobin and hematocrit.  Continue twice a day PPI and 4 times a day sucralfate.  Likely will need repeat upper endoscopy with APC ablation in 3-4 weeks  2.  Cirrhosis with portal hypertension and ascites -- interestingly his platelets are normal and so is  his INR. This argues for intact synthetic function.  His cirrhosis etiology is unclear but could partially be due to chronic heart failure, and possibly fatty liver disease. Viral hepatitis studies were negative and his LFTs are very normal, which is reassuring. As an outpatient he should be vaccinated for hepatitis A and B.  --Diuretics per cardiology --Ultrasound showed questionable hepatic lesion, CT scan was performed today and is pending to better evaluate this lesion (though IV contrast could not be administered due to to low GFR). AFP was normal --Low sodium diet  He should followup with me in the office within 2-3 weeks of discharge (plan repeat hgb, iron studies, and schedule repeat EGD for GAVE ablation).  May be discharged soon per patient, deferred to cardiology, okay from GI standpoint   Active Problems:   Anemia   Renal failure (ARF), acute on chronic   Pleural effusion, right   Diabetes type 2, uncontrolled   HTN (hypertension)   Hyperlipidemia   CAD (coronary artery disease), S/P CABG & stents   Systolic CHF, acute on chronic   Chronic LBP   GAVE (gastric antral vascular ectasia)   Cirrhosis     LOS: 8 days   Teagen Bucio M  09/09/2012, 1:39 PM

## 2012-09-10 LAB — BASIC METABOLIC PANEL
Calcium: 8.5 mg/dL (ref 8.4–10.5)
Creatinine, Ser: 2.3 mg/dL — ABNORMAL HIGH (ref 0.50–1.35)
GFR calc non Af Amer: 28 mL/min — ABNORMAL LOW (ref >90)
Glucose, Bld: 163 mg/dL — ABNORMAL HIGH (ref 70–99)
Sodium: 130 mEq/L — ABNORMAL LOW (ref 135–145)

## 2012-09-10 LAB — CBC
Hemoglobin: 8.6 g/dL — ABNORMAL LOW (ref 13.0–17.0)
MCH: 28.3 pg (ref 26.0–34.0)
MCHC: 33.5 g/dL (ref 30.0–36.0)
MCV: 84.5 fL (ref 78.0–100.0)
Platelets: 231 10*3/uL (ref 150–400)

## 2012-09-10 LAB — GLUCOSE, CAPILLARY: Glucose-Capillary: 158 mg/dL — ABNORMAL HIGH (ref 70–99)

## 2012-09-10 MED ORDER — FUROSEMIDE 20 MG PO TABS: 60.0000 mg | ORAL_TABLET | Freq: Two times a day (BID) | ORAL | Status: DC

## 2012-09-10 MED ORDER — SUCRALFATE 1 GM/10ML PO SUSP: 1.0000 g | Freq: Three times a day (TID) | ORAL | Status: DC

## 2012-09-10 MED ORDER — ISOSORBIDE MONONITRATE ER 30 MG PO TB24: 30.0000 mg | ORAL_TABLET | Freq: Every day | ORAL | Status: DC

## 2012-09-10 MED ORDER — HYDRALAZINE HCL 25 MG PO TABS: 25.0000 mg | ORAL_TABLET | Freq: Three times a day (TID) | ORAL | Status: DC

## 2012-09-10 MED ORDER — INSULIN GLARGINE 100 UNIT/ML ~~LOC~~ SOLN: 15.0000 [IU] | Freq: Every day | SUBCUTANEOUS | Status: DC

## 2012-09-10 MED ORDER — PANTOPRAZOLE SODIUM 40 MG PO TBEC: 40.0000 mg | DELAYED_RELEASE_TABLET | Freq: Two times a day (BID) | ORAL | Status: DC

## 2012-09-10 NOTE — Discharge Summary (Signed)
Physician Discharge Summary  [name] ZOX:[id number] DOB: Feb 05, 1946 DOA: 09/01/2012  PCP: Dina Rich, MD  Admit date: 09/01/2012 Discharge date: 09/10/2012  Time spent: 40 minutes  Recommendations for Outpatient Follow-up:  1. Home with outpatient followup with primary cardiologist and [facility]  Discharge Diagnoses:  Principal Problem:   Systolic CHF, acute on chronic   Active Problems:   Cirrhosis    Renal failure (ARF), acute on chronic   Pleural effusion, right   GAVE (gastric antral vascular ectasia)   Anemia   Diabetes type 2, uncontrolled   HTN (hypertension)   Hyperlipidemia   CAD (coronary artery disease), S/P CABG & stents   Chronic low back pain   Discharge Condition: Fair  Diet recommendation: Diabetic  Filed Weights   09/08/12 0433 09/09/12 0409 09/10/12 0442  Weight: 76.8 kg (169 lb 5 oz) 77.6 kg (171 lb 1.2 oz) 79.107 kg (174 lb 6.4 oz)    History of present illness:  67 y.o. male with history of type II DM, HTN, hyperlipidemia, CAD status post CABG and stents, chronic kidney disease with baseline creatinine 1.9, chronic systolic CHF (last EF said to be 40%) and GERD was transferred from [facility] ED to [facility] for further evaluation and management of worsening dyspnea and leg swelling. Patient gives a 2 week history of gradually worsening bilateral lower extremity swelling, abdominal distention and DOE. He saw his primary cardiologist last week and was placed on high dose Lasix for a few days. Patient's symptoms initially improved but in the last 3 or 4 days, he is noted worsening DOE when he walks a few steps. He denies dyspnea at rest, chest pain, palpitations. He normally uses 2 pillows which has not changed. He has noticed worsening leg swelling. He presented to the [facility] ED where hemoglobin was 7 g/dL (patient states that recently his hemoglobin has been 9.1 and 8.4 g/dL) and creatinine of 2.5. His case was  discussed with the hospitalist and cardiologist there who advised transfer to tertiary hospital. Patient denies any bleeding, vomiting or melena. Patient was transfused 1 unit of PRBCs on route to [facility].     Hospital Course:  Acute on chronic systolic CHF:  Admited to telemetry. 2-D echocardiogram done 3/14 with poor studies.. Cardiology consulted.  - MRI done shows EF 37% . started on Lasix 60 mg twice a day . He was also placed on milrinone and had a right heart catheter done showing PCWP 21 with RA pressure 7. creatinine slowly improving. Started on hydralazine and Imdur for afterload reduction.  -Monitor daily weights and I./O. has lost 6 pounds since admission and clinically much improved after receiving therapeutic paracentesis . -he will followup with his cardiologist in [place].  Right pleural effusion:  Likely related to CHF. Symptoms much better now.  Anemia:  [facility] consulted. EGD on 3/20 with poor visualization due to food in stomach ?gastroparesis  -EGD repeated today which showed nonbleeding GAVE an APC done. recommend Carafate and PPI twice a day. H&H have been stable. He will followup with Dr. Reita May in 2-3 weeks for repeat EGD for GAVE ablation.  Acute on chronic kidney disease:  Likely secondary to cardiorenal syndrome. Renal function stable at 2.3 today. Last creatinine of 1.5 in 10/2011. Needs followup as outpatient.  Cirrhosis  - decompensated given ascites. no varices seen on endoscopy. Paracentesis done on 3/ 20 with 6 L of clear yellow fluid . Showed some reactive cells but negative for malignancy. Ultrasound liver  showed " ill-defined area of mixed echogenicity involving the posterior aspect of right lower lobe of liver." LFTs normal. Acute hepatitis panel and AFP negative. CT of the abdomen done without contrast showing "nodular liver consistent with cirrhosis. Cannot exclude an occult mass. " Will be followed up by GI as outpatient.  Type II-diabetes  mellitus  Lantus dose is reduced to 15 units  Hypertension  Continue Coreg . Added hydralazine and Imdur  Chronic low back pain  Continue pain medications   Code Status: Full   Patient clinically stable to be discharged home with outpatient followups  Consultants:  [facility]  [facility]   Procedures:  2D echo  Right heart catheterization on 3/19 EGD on 3/20 and 3/21        Discharge Exam: Filed Vitals:   09/09/12 0409 09/09/12 1403 09/09/12 1932 09/10/12 0442  BP: 118/66 119/55 108/52 115/54  Pulse: 90 84 90 90  Temp: 98.7 F (37.1 C) 98.6 F (37 C) 99.5 F (37.5 C) 99.3 F (37.4 C)  TempSrc: Oral Oral Oral Oral  Resp: 18 18 10 18   Height:      Weight: 77.6 kg (171 lb 1.2 oz)   79.107 kg (174 lb 6.4 oz)  SpO2: 94% 99% 98% 100%    General: Elderly thin built male in no acute distress  HEENT: no Pallor, moist oral mucosa  Cardiovascular: Normal S1 and S2, no murmurs rub or gallop  Respiratory: Clear to auscultation bilaterally, no added sounds  Abdomen:,, soft, Nontender, nondistended, bowel sounds present  Musculoskeletal: Warm, trace edema  CNS: AAO x3   Discharge Instructions     Medication List    TAKE these medications       aspirin 81 MG tablet  Take 81 mg by mouth daily.     carvedilol 3.125 MG tablet  Commonly known as:  COREG  Take 3.125 mg by mouth 2 (two) times daily with a meal.     furosemide 20 MG tablet  Commonly known as:  LASIX  Take 3 tablets (60 mg total) by mouth 2 (two) times daily.     hydrALAZINE 25 MG tablet  Commonly known as:  APRESOLINE  Take 1 tablet (25 mg total) by mouth every 8 (eight) hours.     insulin glargine 100 UNIT/ML injection  Commonly known as:  LANTUS  Inject 0.15 mLs (15 Units total) into the skin daily after breakfast.     insulin lispro 100 UNIT/ML injection  Commonly known as:  HUMALOG  Inject 3-10 Units into the skin 3 (three) times daily before meals. Based on sliding scale      isosorbide mononitrate 30 MG 24 hr tablet  Commonly known as:  IMDUR  Take 1 tablet (30 mg total) by mouth daily.     multivitamin with minerals Tabs  Take 1 tablet by mouth daily.     oxyCODONE 5 MG immediate release tablet  Commonly known as:  Oxy IR/ROXICODONE  Take 5 mg by mouth every 4 (four) hours as needed for pain.     pantoprazole 40 MG tablet  Commonly known as:  PROTONIX  Take 1 tablet (40 mg total) by mouth 2 (two) times daily.     ranolazine 500 MG 12 hr tablet  Commonly known as:  RANEXA  Take 1,000 mg by mouth 2 (two) times daily.     simvastatin 20 MG tablet  Commonly known as:  ZOCOR  Take 20 mg by mouth every evening.  sucralfate 1 GM/10ML suspension  Commonly known as:  CARAFATE  Take 10 mLs (1 g total) by mouth 4 (four) times daily - after meals and at bedtime.     VITAMIN B 12 PO  Take 1 tablet by mouth daily.     VITAMIN D PO  Take 1 tablet by mouth daily.           Follow-up Information   Follow up with DOUGH,ROBERT, MD In 1 week.   Contact information:   [address] [place] [address] [phone]       Follow up with Norman Herrlich, MD. Schedule an appointment as soon as possible for a visit in 2 weeks.   Contact information:   [address] [place] [address] [phone]       Follow up with PYRTLE, Carie Caddy, MD. Call in 3 weeks.   Contact information:   [address] [place] [address] [phone]        The results of significant diagnostics from this hospitalization (including imaging, microbiology, ancillary and laboratory) are listed below for reference.    Significant Diagnostic Studies: Ct Abdomen Pelvis Wo Contrast  09/09/2012  *RADIOLOGY REPORT*  Clinical Data: Abnormality on ultrasound  CT ABDOMEN AND PELVIS WITHOUT CONTRAST  Technique:  Multidetector CT imaging of the abdomen and pelvis was performed following the standard protocol without intravenous contrast.  Comparison: Ultrasound  09/06/2012  Findings: There is a is moderate right effusion.  There is right basilar atelectasis and potential consolidation.  Left base is clear.  No pericardial fluid.  The liver has a shrunken nodular contour.  The liver cannot be adequately reassessed for mass without contrast.  No discrete mass identified.  There are gallstones in the gallbladder which is nondistended.  The pancreas is atrophic.  Spleen, adrenal glands, kidneys are normal.  Stomach, small bowel, colon grossly normal.  There is a moderate volume intraperitoneal free fluid seen along the pericolic gutters and surrounding the liver and spleen.  There is a moderate volume of free fluid the pelvis.  Post hysterectomy anatomy.  Bladder appears normal. Review of  bone windows demonstrates no aggressive osseous lesions.  IMPRESSION:  1.  The liver is not adequately evaluated without IV contrast. Liver is nodular and shrunken node consistent with cirrhosis. Cannot exclude an occult mass with the degree of nodularity. Consider MRI which has better soft tissue contrast if the patient cannot receive IV contrast in the  future. 2.  Moderate volume ascites  likely relates to portal hypertension/cirrhosis. 3.  Cholelithiasis without cholecystitis. 4.  Consolidation versus atelectasis at the right lung base with small effusions.   Original Report Authenticated By: Genevive Bi, M.D.    US Abdomen Complete  09/06/2012  *RADIOLOGY REPORT*  Clinical Data:  Cirrhosis, ascites  COMPLETE ABDOMINAL ULTRASOUND  Comparison:  None.  Findings:  Gallbladder:  Multiple gallstones are seen lying dependently within an otherwise normal-appearing gallbladder.  Negative sonographic Murphy's sign.  Common bile duct:  Normal in size measuring 4.6 mm in diameter.  Liver:  There is marked heterogeneity of the hepatic parenchyma. Note is made of a nodular contour of the liver edge. There is an ill-defined area of mixed echogenicity involving the posterior aspect of the right  lobe of the liver (image 31).  No definite intrahepatic ductal dilatation.  Large volume intra-abdominal ascites.  IVC:  Obscured by bowel gas  Pancreas:  Obscured by bowel gas  Spleen:  Normal in size measuring 6.8 cm in length  Right Kidney:  Normal cortical thickness, echogenicity and size, measuring 9.8 cm in length.  No focal renal lesions.  No echogenic renal stones.  No urinary obstruction.  Left Kidney:  Normal cortical thickness, echogenicity and size, measuring 9.9 cm in length.  No focal renal lesions.  No echogenic renal stones.  No urinary obstruction.  Abdominal aorta:  Obscured by bowel gas.  IMPRESSION:  1.  Shrunken, nodular appearance of the liver with large volume ascites, findings compatible with provided history of cirrhosis.  2.  Ill-defined area of mixed echogenicity involving the posterior aspect of the right lobe of the liver, suboptimally evaluated on this examination, though a discrete hepatic mass is not excluded. Further evaluation with alpha-fetoprotein level and abdominal MRI is recommended. 3.  Cholelithiasis without evidence of cholecystitis.  This was made a call report.   Original Report Authenticated By: Tacey Ruiz, MD    Dg Chest Right Decubitus  09/03/2012  *RADIOLOGY REPORT*  Clinical Data: Right pleural effusion.  CHEST - RIGHT DECUBITUS  Comparison: 09/01/2012  Findings: A right lateral decubitus view of the chest demonstrates no definite layering effusion. Right lateral basilar opacity is noted and could represent consolidation versus loculated pleural fluid.  IMPRESSION: No evidence of free flowing effusion.  Right lateral basilar opacity again identified - question consolidation versus loculated pleural fluid.   Original Report Authenticated By: Harmon Pier, M.D.    US Paracentesis  09/07/2012  *RADIOLOGY REPORT*  Clinical Data: CHF, chronic kidney disease, cirrhosis, ascites. Request is made for diagnostic and therapeutic paracentesis up to 6 liters.  ULTRASOUND  GUIDED DIAGNOSTIC AND THERAPEUTIC  PARACENTESIS  An ultrasound guided paracentesis was thoroughly discussed with the patient and questions answered.  The benefits, risks, alternatives and complications were also discussed.  The patient understands and wishes to proceed with the procedure.  Written consent was obtained.  Ultrasound was performed to localize and mark an adequate pocket of fluid in the right lower quadrant of the abdomen.  The area was then prepped and draped in the normal sterile fashion.  1% Lidocaine was used for local anesthesia.  Under ultrasound guidance a 19 gauge Yueh catheter was introduced.  Paracentesis was performed.  The catheter was removed and a dressing applied.  Complications:  none  Findings:  A total of approximately 6 liters of clear, yellow fluid was removed.  A fluid sample was sent for laboratory analysis.  IMPRESSION: Successful ultrasound guided diagnostic and therapeutic paracentesis yielding 6 liters of ascites.  Read by: Jeananne Rama, P.A.-C   Original Report Authenticated By: Tacey Ruiz, MD    Dg Chest Port 1 View  09/01/2012  *RADIOLOGY REPORT*  Clinical Data: CHF, cough, shortness of breath.  PORTABLE CHEST - 1 VIEW  Comparison: 10/12/2007  Findings: Prominent cardiomediastinal contours.  Central vascular congestion.  Moderate right pleural effusion and associated airspace opacity.  Mild left lung base opacity.  Degraded by rotation.  Status post median sternotomy and CABG.  Evidence of prior vertebroplasties.  No definite pneumothorax.  IMPRESSION: Right pleural effusion and associated airspace opacity; atelectasis versus pneumonia.  Mild left lung base opacity, favor atelectasis.  Prominent cardiomediastinal contours status post median sternotomy and CABG.   Original Report Authenticated By: Jearld Lesch, M.D.    Dg Abd Portable 1v  09/01/2012  *RADIOLOGY REPORT*  Clinical Data: Recent capsule endoscopy.  Evaluate for retained capsule.  PORTABLE ABDOMEN - 1  VIEW  Comparison: None.  Findings: The bowel gas pattern is within normal  limits.  No retained capsule camera identified.  Surgical clips and staples noted, as well as vascular calcifications.  IMPRESSION: No acute findings.  No retained capsule camera identified.   Original Report Authenticated By: Myles Rosenthal, M.D.    Mr Card Morphology W/o Cm  09/04/2012  *RADIOLOGY REPORT*  Clinical Data: Cardiomyopathy, unable to assess LV function by echo.  MR CARDIA MORPHOLOGY WITHOUT CONTRAST  GE 1.5 T magnet with dedicated cardiac coil.  FIESTA sequences for function and morphology.  Free-breathing sequences were used.  No contrast was used due to renal dysfunction.  EF was calculated at a dedicated workstation.  Contrast:  Comparison: None.  Findings: Difficult study as patient had a hard time holding his breath.  Free-breathing sequences were used.  Normal left ventricular size and wall thickness.  The mid to apical anterior, mid to apical anteroseptal, and mid to apical inferior walls appeared moderately to severely hypokinetic. EF was calculated to be 37%.  Normal right ventricular size and systolic function. Moderate left atrial enlargement.  Mild to moderate right atrial enlargement.  There appeared to be mild mitral regurgitation though flow sequences to quantify were not done.  Trileaflet aortic valve without significant stenosis.  Measurements:  LV EDV 193 mL  LV SV 71 mL  LV EF 37%  IMPRESSION: Normal LV size with moderate systolic dysfunction, EF 37%.  Wall motion abnormalities as noted above.  Technically difficult study due to poor breath-holding.   Original Report Authenticated By: Marca Ancona     Microbiology: No results found for this or any previous visit (from the past 240 hour(s)).   Labs: Basic Metabolic Panel:  Recent Labs Lab 09/06/12 1614 09/07/12 0505 09/08/12 0438 09/09/12 0442 09/10/12 0605  NA 132* 131* 132* 133* 130*  K 3.4* 4.2 3.8 3.4* 4.0  CL 92* 92* 93* 93* 92*  CO2 29  28 29 30 29   GLUCOSE 197* 193* 124* 153* 163*  BUN 74* 75* 67* 68* 65*  CREATININE 2.54*  2.52* 2.49* 2.18* 2.21* 2.30*  CALCIUM 8.7 8.8 8.8 8.5 8.5   Liver Function Tests:  Recent Labs Lab 09/08/12 0438  AST 18  ALT 9  ALKPHOS 102  BILITOT 0.5  PROT 6.1  ALBUMIN 2.9*   No results found for this basename: LIPASE, AMYLASE,  in the last 168 hours No results found for this basename: AMMONIA,  in the last 168 hours CBC:  Recent Labs Lab 09/06/12 1614 09/07/12 0505 09/08/12 0438 09/09/12 0442 09/10/12 0605  WBC 5.5 5.8 8.1 8.5 7.2  HGB 8.4* 8.4* 9.5* 8.4* 8.6*  HCT 25.9* 25.5* 28.5* 25.4* 25.7*  MCV 86.9 85.3 85.6 86.4 84.5  PLT 264 281 327 239 231   Cardiac Enzymes: No results found for this basename: CKTOTAL, CKMB, CKMBINDEX, TROPONINI,  in the last 168 hours BNP: BNP (last 3 results)  Recent Labs  09/01/12 0430  PROBNP 10584.0*   CBG:  Recent Labs Lab 09/09/12 0558 09/09/12 1104 09/09/12 1617 09/09/12 2047 09/10/12 0607  GLUCAP 139* 186* 225* 184* 158*       Signed:  Trayven Lumadue  [facility] 09/10/2012, 10:19 AM

## 2012-09-10 NOTE — Progress Notes (Signed)
Patient ID: [name], male   DOB: January 04, 1946, 67 y.o.   MRN: [id number]    Agree patient is ready to go home. Cardiology followup would be with Dr. Norman Herrlich, his cardiologist  In [place].  Jerral Bonito, MD

## 2012-09-10 NOTE — Progress Notes (Signed)
   CARE MANAGEMENT NOTE 09/10/2012  Patient:  [name], [name]   Account Number:  [id number]  Date Initiated:  09/04/2012  Documentation initiated by:  Junius Creamer  Subjective/Objective Assessment:   adm w chf, anemia     Action/Plan:   lives w husband, pcp dr Molly Maduro dough   Anticipated DC Date:  09/12/2012   Anticipated DC Plan:  HOME W HOME HEALTH SERVICES      DC Planning Services  CM consult      [facility] Choice  Resumption Of Svcs/PTA Provider   Choice offered to / List presented to:          HH arranged  HH-2 PT      [facility] agency  [facility]   Status of service:  Completed, signed off Medicare Important Message given?   (If response is "NO", the following Medicare IM given date fields will be blank) Date Medicare IM given:   Date Additional Medicare IM given:    Discharge Disposition:  HOME W HOME HEALTH SERVICES  Per UR Regulation:  Reviewed for med. necessity/level of care/duration of stay  If discussed at Long Length of Stay Meetings, dates discussed:    Comments:  09/09/2012 1200 Faxed orders to [facility] for resumption of HH PT for dc on 09/10/2012. Isidoro Donning RN CCM Case Mgmt phone [phone]  09/06/12 1515 [address] RN BSN MSN CCM PT recommends home therapy.  Per pt, he had been receiving home health PT and RN services from [facility], they are requesting orders when pt is discharged so they can resume services.  3/17 1138 debbie dowell rn,bsn

## 2012-09-10 NOTE — Progress Notes (Signed)
Pt discharged per Md order and protocol. Pt aware of all follow up appointments and all rx'es given. All questions answered.

## 2012-09-10 NOTE — Progress Notes (Signed)
09/10/2012 1650 NCM contacted [facility] and spoke to oncall RN, Sharyl Nimrod to make aware of dc home. Pt open for RN and PT. Faxed resumption of care orders for dc today with HH. Isidoro Donning RN CCM Case Mgmt phone [phone]

## 2012-09-11 ENCOUNTER — Encounter (HOSPITAL_COMMUNITY): Payer: Self-pay | Admitting: Internal Medicine

## 2012-09-12 ENCOUNTER — Telehealth: Payer: Self-pay | Admitting: Cardiology

## 2012-09-12 NOTE — Telephone Encounter (Signed)
Spoke with pt and have given pt appt to see Dr Shirlee Latch tomorrow.

## 2012-09-12 NOTE — Telephone Encounter (Signed)
New Prob    Pt was released from the hospital. Still experiencing swelling in his right foot and is on 6 dieretics. Requesting to increase dosage or number of dieretics.

## 2012-09-12 NOTE — Telephone Encounter (Signed)
Spoke with patient. Pt states he was discharged from the hospital 09/10/12. Pt states he had some swelling in his right foot when he left the hospital. Today he states his right foot and leg is swollen almost up to his knee.

## 2012-09-12 NOTE — Telephone Encounter (Signed)
Spoke with patient he stated he is still having swelling in rt foot.No Sob.Patient wants to know if he can increase his diuretic.States takes Torsemide 20 mg 3 tablets twice a day,Hydralazine 25 mg daily.Message sent to Dr.McLean for advice.

## 2012-09-12 NOTE — Telephone Encounter (Signed)
Have him come in to be seen.  Needs BMET, office visit asap.

## 2012-09-13 ENCOUNTER — Ambulatory Visit (INDEPENDENT_AMBULATORY_CARE_PROVIDER_SITE_OTHER): Payer: Medicare Other | Admitting: Cardiology

## 2012-09-13 ENCOUNTER — Other Ambulatory Visit: Payer: Self-pay | Admitting: *Deleted

## 2012-09-13 ENCOUNTER — Encounter: Payer: Self-pay | Admitting: Cardiology

## 2012-09-13 VITALS — BP 122/60 | HR 77 | Ht 67.0 in | Wt 179.0 lb

## 2012-09-13 DIAGNOSIS — K7460 Unspecified cirrhosis of liver: Secondary | ICD-10-CM

## 2012-09-13 DIAGNOSIS — N189 Chronic kidney disease, unspecified: Secondary | ICD-10-CM

## 2012-09-13 DIAGNOSIS — N179 Acute kidney failure, unspecified: Secondary | ICD-10-CM

## 2012-09-13 DIAGNOSIS — I658 Occlusion and stenosis of other precerebral arteries: Secondary | ICD-10-CM

## 2012-09-13 DIAGNOSIS — I25810 Atherosclerosis of coronary artery bypass graft(s) without angina pectoris: Secondary | ICD-10-CM

## 2012-09-13 DIAGNOSIS — I509 Heart failure, unspecified: Secondary | ICD-10-CM

## 2012-09-13 DIAGNOSIS — K31819 Angiodysplasia of stomach and duodenum without bleeding: Secondary | ICD-10-CM

## 2012-09-13 DIAGNOSIS — N185 Chronic kidney disease, stage 5: Secondary | ICD-10-CM

## 2012-09-13 DIAGNOSIS — I5022 Chronic systolic (congestive) heart failure: Secondary | ICD-10-CM

## 2012-09-13 DIAGNOSIS — I2510 Atherosclerotic heart disease of native coronary artery without angina pectoris: Secondary | ICD-10-CM

## 2012-09-13 DIAGNOSIS — I6523 Occlusion and stenosis of bilateral carotid arteries: Secondary | ICD-10-CM

## 2012-09-13 DIAGNOSIS — E785 Hyperlipidemia, unspecified: Secondary | ICD-10-CM

## 2012-09-13 MED ORDER — CARVEDILOL 6.25 MG PO TABS: 6.2500 mg | ORAL_TABLET | Freq: Two times a day (BID) | ORAL | Status: DC

## 2012-09-13 MED ORDER — FUROSEMIDE 40 MG PO TABS: ORAL_TABLET | ORAL | Status: DC

## 2012-09-13 NOTE — Patient Instructions (Addendum)
Stop Ranexa(ranolazine).  Increase lasix(furosemide) to 80mg  two times a day. This will be 2 of a 40mg  tablet two times a day.   Increase coreg(carvedilol) to 6.25mg  two times a day.     Your physician has requested that you have a carotid duplex. This test is an ultrasound of the carotid arteries in your neck. It looks at blood flow through these arteries that supply the brain with blood. Allow one hour for this exam. There are no restrictions or special instructions.  Schedule an appointment for an abdominal  paracentesis. You should have a serum albumin and body fluid cell count done at that time.    Your physician recommends that you return for a FASTING lipid profile /BMET/BNP in 1 week . You have the order. Please fax the results to Dr Shirlee Latch. [phone].  Your physician recommends that you schedule a follow-up appointment in: 2 weeks with Dr Shirlee Latch.     You have been referred to Nephrology for management of your chronic kidney disease.

## 2012-09-13 NOTE — Progress Notes (Signed)
Patient ID: [name], male   DOB: 01/01/1946, 67 y.o.   MRN: [id number] PCP Dr. 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  I initially saw him in the hospital earlier this month.  He was admitted with acute on chronic systolic CHF.  Cardiac MRI showed EF 37%.  He was started on milrinone and diuresed while in the hospital.  RHC on milrinone showed relatively compensated filling pressures and he was converted over to po Lasix.  He was noted to have significant ascites and had a therapeutic paracentesis while in the hospital.  Imaging showed cirrhosis. Hepatitis panel and AFP were negative.  He also was anemic and was followed by GI with EGD and APC of GAVE.    He is now back home with his wife.  He feels like his abdomen is filling up with fluid again.  He is walking with a walker and getting home PT.  He is short of breath after walking 75-100 feet.  No chest pain.  No orthopnea/PND.  He has followup with GI in a couple of weeks.   Labs (3/14): K 4, creatinine 2.3, HCT 25.7  ECG: NSR with low voltage P waves, old inferior and anterolateral MIs  Past Medical History:  1. Type II diabetes  2. HTN  3. Hyperlipidemia  4. CAD:  - CABG 1999  - PCI 2007 to SVG-OM  - Last cath 2008 showed patent LIMA-LAD, SVG-OM, and SVG-PDA. Patient had PTCA to PLV at that time.  5. Systolic CHF: Echo in 2009 showed EF 50-55%. ? EF 40% on echo subsequently in [place]. Echo in 3/14 at [facility] was a very difficult study.  Cardiac MRI (without contrast given elevated creatinine) showed EF 37% with mid-apical anterior, anteroseptal, and inferior moderate to severe hypokinesis.  RHC (3/14) with mean RA 7, PA 31/19, mean PCWP 21, CI 3.66 (on milrinone).   6. Obesity  7. CKD  8. Cirrhosis: Possibly due to NASH.  No h/o ETOH.  Hepatitis panel and AFP negative in 3/14. He has ascites with prior paracentesis.  9. GAVE with anemia: EGD  with APC in 3/14.  He is on PPI.  10. CKD 11. H/o CEA  SH: Married, retired, lives in [place].  He no longer smokes.   FH: CAD  ROS: All systems reviewed and negative except as per HPI.   Current Outpatient Prescriptions  Medication Sig Dispense Refill  . aspirin 81 MG tablet Take 81 mg by mouth daily.      . Cholecalciferol (VITAMIN D PO) Take 1 tablet by mouth daily.      . Cyanocobalamin (VITAMIN B 12 PO) Take 1 tablet by mouth daily.      . hydrALAZINE (APRESOLINE) 25 MG tablet Take 1 tablet (25 mg total) by mouth every 8 (eight) hours.  90 tablet  0  . insulin glargine (LANTUS) 100 UNIT/ML injection Inject 0.15 mLs (15 Units total) into the skin daily after breakfast.  10 mL  0  . insulin lispro (HUMALOG) 100 UNIT/ML injection Inject 3-10 Units into the skin 3 (three) times daily before meals. Based on sliding scale      . isosorbide mononitrate (IMDUR) 30 MG 24 hr tablet Take 1 tablet (30 mg total) by mouth daily.  30 tablet  0  . Multiple Vitamin (MULITIVITAMIN WITH MINERALS) TABS Take 1 tablet by mouth daily.      Marland Kitchen oxyCODONE (OXY  IR/ROXICODONE) 5 MG immediate release tablet Take 5 mg by mouth every 4 (four) hours as needed for pain.      . pantoprazole (PROTONIX) 40 MG tablet Take 1 tablet (40 mg total) by mouth 2 (two) times daily.  60 tablet  0  . simvastatin (ZOCOR) 20 MG tablet Take 20 mg by mouth every evening.      . sucralfate (CARAFATE) 1 GM/10ML suspension Take 10 mLs (1 g total) by mouth 4 (four) times daily - after meals and at bedtime.  420 mL  0  . carvedilol (COREG) 6.25 MG tablet Take 1 tablet (6.25 mg total) by mouth 2 (two) times daily.  60 tablet  3  . furosemide (LASIX) 40 MG tablet 2 tablets (total 80mg ) two times a day  120 tablet  3   No current facility-administered medications for this visit.    BP 122/60  Pulse 77  Ht 5\' 7"  (1.702 m)  Wt 179 lb (81.194 kg)  BMI 28.03 kg/m2 General: NAD Neck: JVP 8-9 cm, no thyromegaly or thyroid nodule.  Lungs:  Clear to auscultation bilaterally with normal respiratory effort. CV: Nondisplaced PMI.  Heart regular S1/S2, no S3/S4, no murmur.  1+ edema 1/3 up lower legs bilaterally.  No carotid bruit.  Normal pedal pulses.  Abdomen: Soft, nontender, no hepatosplenomegaly, distended with fluid wave.  Neurologic: Alert and oriented x 3.  Psych: Normal affect. Extremities: No clubbing or cyanosis.   Assessment/Plan: 1. CHF: Chronic systolic CHF, NYHA class III symptoms.  EF 37% with wall motion abnormalities on cardiac MRI (echo was difficult study).  He is volume overloaded on exam today (more so than when he left the hospital).  - Increase Lasix to 80 mg bid.  - Will need BMET and BNP in 1 week. - Continue current hydralazine/Imdur (not on ACEI at this time with elevated creatinine).  - Would increase Coreg to 6.25 mg bid.  2. Cirrhosis: Suspect NASH.  Workup negative for viral hepatitis and never a drinker.  He has more ascites on exam and his abdomen feels distended.   - Will arrange for therapeutic paracentesis by IR.   - He has GI followup.  3. Anemia: Suspect upper GI bleeding from GAVE.  Had APC in hospital.  Will get CBC with labs.  Will followup with GI.  4. CKD: Suspect there is a cardiorenal component.  Will have to follow creatinine carefully with diuresis.  I will arrange followup with nephrology.  5. CAD: No chest pain.  At this point, I do not think there is indication for functional study. He would not be a good cath candidate given his creatinine.  I will stop ranolazine given his low GFR (he has been started on Imdur so hopefully will not have angina).  Continue ASA, statin.  6. Hyperlipidemia: Will check lipids with next labs.  7. H/o CEA: Will get carotid dopplers.    He will need close followup.  I will see him in 2 wks.   [name] 09/14/2012 7:20 AM

## 2012-09-14 ENCOUNTER — Telehealth: Payer: Self-pay | Admitting: Cardiology

## 2012-09-14 DIAGNOSIS — I5022 Chronic systolic (congestive) heart failure: Secondary | ICD-10-CM | POA: Insufficient documentation

## 2012-09-14 HISTORY — DX: Chronic systolic (congestive) heart failure: I50.22

## 2012-09-14 NOTE — Telephone Encounter (Signed)
Spoke with patient regarding his concern that his bowels have not moved since last Friday.  Patient states takes Oxycodone for back problems (cracked vertebrae) caused by a fall a few years ago and prescribed by PCP.  Patient advised that he should consult with PCP but we recommend taking Colace and/or Miralax.  Patient verbalized understanding and advised to take Colace with Oxycodone on a regular basis to hopefully prevent further problems.

## 2012-09-14 NOTE — Telephone Encounter (Signed)
New Problem:    Patient would like to know if it would be ok to take a laxative today because he is constipated.  Please call back.

## 2012-09-15 ENCOUNTER — Ambulatory Visit (HOSPITAL_COMMUNITY)
Admission: RE | Admit: 2012-09-15 | Discharge: 2012-09-15 | Disposition: A | Discharge status: Home / Self Care | Payer: PRIVATE HEALTH INSURANCE | Source: Ambulatory Visit | Attending: Cardiology | Admitting: Cardiology

## 2012-09-15 DIAGNOSIS — R188 Other ascites: Secondary | ICD-10-CM | POA: Insufficient documentation

## 2012-09-15 DIAGNOSIS — N185 Chronic kidney disease, stage 5: Secondary | ICD-10-CM

## 2012-09-15 DIAGNOSIS — I6523 Occlusion and stenosis of bilateral carotid arteries: Secondary | ICD-10-CM

## 2012-09-15 DIAGNOSIS — N189 Chronic kidney disease, unspecified: Secondary | ICD-10-CM | POA: Insufficient documentation

## 2012-09-15 DIAGNOSIS — I25810 Atherosclerosis of coronary artery bypass graft(s) without angina pectoris: Secondary | ICD-10-CM

## 2012-09-15 DIAGNOSIS — I509 Heart failure, unspecified: Secondary | ICD-10-CM | POA: Insufficient documentation

## 2012-09-15 DIAGNOSIS — K7460 Unspecified cirrhosis of liver: Secondary | ICD-10-CM

## 2012-09-15 LAB — ALBUMIN, FLUID (OTHER): Albumin, Fluid: 1.4 g/dL

## 2012-09-15 LAB — BODY FLUID CELL COUNT WITH DIFFERENTIAL
Lymphs, Fluid: 86 %
Monocyte-Macrophage-Serous Fluid: 10 % — ABNORMAL LOW (ref 50–90)
Neutrophil Count, Fluid: 4 % (ref 0–25)

## 2012-09-15 NOTE — Procedures (Signed)
Successful US guided paracentesis from RLQ.  Yielded 4.3L of clear yellow fluid.  No immediate complications.  Pt tolerated well.   Specimen was sent for labs.  Brayton El PA-C 09/15/2012 2:41 PM

## 2012-09-15 NOTE — Telephone Encounter (Signed)
Spoke with provider at [facility] U/S department to clarify orders regarding patient's paracentesis.  Question regarding why order says serum albumin and body fluid cell count, specifically why there is a serum level indicated for them since they do not obtain blood.  Reviewed Dr. Alford Highland o/v notes and orders and advised provider to obtain body fluid levels and if additional lab work needed we will obtain at a later date.

## 2012-09-15 NOTE — Telephone Encounter (Signed)
New Prob    Wanting to speak to nurse regarding an order. Needs to clarify some information.

## 2012-09-17 ENCOUNTER — Encounter (HOSPITAL_COMMUNITY): Payer: Self-pay | Admitting: *Deleted

## 2012-09-17 ENCOUNTER — Emergency Department (HOSPITAL_COMMUNITY): Payer: PRIVATE HEALTH INSURANCE

## 2012-09-17 ENCOUNTER — Telehealth: Payer: Self-pay | Admitting: Physician Assistant

## 2012-09-17 ENCOUNTER — Inpatient Hospital Stay (HOSPITAL_COMMUNITY)
Admission: EM | Admit: 2012-09-17 | Discharge: 2012-09-21 | DRG: 291 | Disposition: A | Discharge status: Home / Self Care | Payer: PRIVATE HEALTH INSURANCE | Attending: Internal Medicine | Admitting: Internal Medicine

## 2012-09-17 ENCOUNTER — Inpatient Hospital Stay (HOSPITAL_COMMUNITY): Payer: PRIVATE HEALTH INSURANCE

## 2012-09-17 DIAGNOSIS — IMO0002 Reserved for concepts with insufficient information to code with codable children: Secondary | ICD-10-CM | POA: Diagnosis present

## 2012-09-17 DIAGNOSIS — I5022 Chronic systolic (congestive) heart failure: Secondary | ICD-10-CM | POA: Diagnosis present

## 2012-09-17 DIAGNOSIS — K8020 Calculus of gallbladder without cholecystitis without obstruction: Secondary | ICD-10-CM | POA: Diagnosis present

## 2012-09-17 DIAGNOSIS — N179 Acute kidney failure, unspecified: Secondary | ICD-10-CM | POA: Diagnosis present

## 2012-09-17 DIAGNOSIS — K31811 Angiodysplasia of stomach and duodenum with bleeding: Secondary | ICD-10-CM | POA: Diagnosis present

## 2012-09-17 DIAGNOSIS — R0602 Shortness of breath: Secondary | ICD-10-CM | POA: Diagnosis present

## 2012-09-17 DIAGNOSIS — I509 Heart failure, unspecified: Secondary | ICD-10-CM | POA: Diagnosis present

## 2012-09-17 DIAGNOSIS — N183 Chronic kidney disease, stage 3 unspecified: Secondary | ICD-10-CM | POA: Diagnosis present

## 2012-09-17 DIAGNOSIS — E871 Hypo-osmolality and hyponatremia: Secondary | ICD-10-CM | POA: Diagnosis present

## 2012-09-17 DIAGNOSIS — Z6827 Body mass index (BMI) 27.0-27.9, adult: Secondary | ICD-10-CM

## 2012-09-17 DIAGNOSIS — K31819 Angiodysplasia of stomach and duodenum without bleeding: Secondary | ICD-10-CM | POA: Diagnosis present

## 2012-09-17 DIAGNOSIS — E119 Type 2 diabetes mellitus without complications: Secondary | ICD-10-CM | POA: Diagnosis present

## 2012-09-17 DIAGNOSIS — E1165 Type 2 diabetes mellitus with hyperglycemia: Secondary | ICD-10-CM | POA: Diagnosis present

## 2012-09-17 DIAGNOSIS — R109 Unspecified abdominal pain: Secondary | ICD-10-CM

## 2012-09-17 DIAGNOSIS — Z951 Presence of aortocoronary bypass graft: Secondary | ICD-10-CM

## 2012-09-17 DIAGNOSIS — Z9861 Coronary angioplasty status: Secondary | ICD-10-CM

## 2012-09-17 DIAGNOSIS — E669 Obesity, unspecified: Secondary | ICD-10-CM | POA: Diagnosis present

## 2012-09-17 DIAGNOSIS — J90 Pleural effusion, not elsewhere classified: Secondary | ICD-10-CM

## 2012-09-17 DIAGNOSIS — Z8249 Family history of ischemic heart disease and other diseases of the circulatory system: Secondary | ICD-10-CM

## 2012-09-17 DIAGNOSIS — N189 Chronic kidney disease, unspecified: Secondary | ICD-10-CM | POA: Diagnosis present

## 2012-09-17 DIAGNOSIS — D500 Iron deficiency anemia secondary to blood loss (chronic): Secondary | ICD-10-CM | POA: Diagnosis present

## 2012-09-17 DIAGNOSIS — R5381 Other malaise: Secondary | ICD-10-CM | POA: Diagnosis present

## 2012-09-17 DIAGNOSIS — E876 Hypokalemia: Secondary | ICD-10-CM | POA: Diagnosis not present

## 2012-09-17 DIAGNOSIS — E785 Hyperlipidemia, unspecified: Secondary | ICD-10-CM

## 2012-09-17 DIAGNOSIS — I10 Essential (primary) hypertension: Secondary | ICD-10-CM

## 2012-09-17 DIAGNOSIS — I5023 Acute on chronic systolic (congestive) heart failure: Principal | ICD-10-CM | POA: Diagnosis present

## 2012-09-17 DIAGNOSIS — K7460 Unspecified cirrhosis of liver: Secondary | ICD-10-CM | POA: Diagnosis present

## 2012-09-17 DIAGNOSIS — I2510 Atherosclerotic heart disease of native coronary artery without angina pectoris: Secondary | ICD-10-CM | POA: Diagnosis present

## 2012-09-17 DIAGNOSIS — M545 Low back pain: Secondary | ICD-10-CM

## 2012-09-17 DIAGNOSIS — D649 Anemia, unspecified: Secondary | ICD-10-CM | POA: Diagnosis present

## 2012-09-17 HISTORY — DX: Unspecified cirrhosis of liver: K74.60

## 2012-09-17 HISTORY — DX: Angiodysplasia of stomach and duodenum without bleeding: K31.819

## 2012-09-17 HISTORY — DX: Chronic systolic (congestive) heart failure: I50.22

## 2012-09-17 HISTORY — DX: Calculus of gallbladder without cholecystitis without obstruction: K80.20

## 2012-09-17 LAB — CBC WITH DIFFERENTIAL/PLATELET
Basophils Absolute: 0 10*3/uL (ref 0.0–0.1)
Lymphocytes Relative: 29 % (ref 12–46)
Lymphs Abs: 2.1 10*3/uL (ref 0.7–4.0)
MCV: 86.1 fL (ref 78.0–100.0)
Neutro Abs: 4.3 10*3/uL (ref 1.7–7.7)
Neutrophils Relative %: 58 % (ref 43–77)
Platelets: 300 10*3/uL (ref 150–400)
RBC: 3.17 MIL/uL — ABNORMAL LOW (ref 4.22–5.81)
RDW: 16 % — ABNORMAL HIGH (ref 11.5–15.5)
WBC: 7.5 10*3/uL (ref 4.0–10.5)

## 2012-09-17 LAB — POCT I-STAT TROPONIN I: Troponin i, poc: 0.04 ng/mL (ref 0.00–0.08)

## 2012-09-17 LAB — URINALYSIS, ROUTINE W REFLEX MICROSCOPIC
Bilirubin Urine: NEGATIVE
Hgb urine dipstick: NEGATIVE
Ketones, ur: NEGATIVE mg/dL
Specific Gravity, Urine: 1.012 (ref 1.005–1.030)
Urobilinogen, UA: 1 mg/dL (ref 0.0–1.0)

## 2012-09-17 LAB — GLUCOSE, CAPILLARY: Glucose-Capillary: 65 mg/dL — ABNORMAL LOW (ref 70–99)

## 2012-09-17 LAB — COMPREHENSIVE METABOLIC PANEL
ALT: 9 U/L (ref 0–53)
AST: 20 U/L (ref 0–37)
Alkaline Phosphatase: 113 U/L (ref 39–117)
CO2: 30 mEq/L (ref 19–32)
Calcium: 8.8 mg/dL (ref 8.4–10.5)
Chloride: 97 mEq/L (ref 96–112)
GFR calc Af Amer: 33 mL/min — ABNORMAL LOW (ref >90)
GFR calc non Af Amer: 29 mL/min — ABNORMAL LOW (ref >90)
Glucose, Bld: 67 mg/dL — ABNORMAL LOW (ref 70–99)
Potassium: 4.4 mEq/L (ref 3.5–5.1)
Sodium: 135 mEq/L (ref 135–145)
Total Bilirubin: 0.3 mg/dL (ref 0.3–1.2)

## 2012-09-17 LAB — CBC
MCH: 27.7 pg (ref 26.0–34.0)
MCHC: 32.9 g/dL (ref 30.0–36.0)
Platelets: 310 10*3/uL (ref 150–400)
RBC: 3 MIL/uL — ABNORMAL LOW (ref 4.22–5.81)

## 2012-09-17 LAB — TROPONIN I: Troponin I: <0.3 ng/mL (ref <0.30)

## 2012-09-17 LAB — BASIC METABOLIC PANEL
Calcium: 8.7 mg/dL (ref 8.4–10.5)
GFR calc non Af Amer: 29 mL/min — ABNORMAL LOW (ref >90)
Sodium: 134 mEq/L — ABNORMAL LOW (ref 135–145)

## 2012-09-17 LAB — PRO B NATRIURETIC PEPTIDE: Pro B Natriuretic peptide (BNP): 11010 pg/mL — ABNORMAL HIGH (ref 0–125)

## 2012-09-17 LAB — PROTIME-INR: INR: 0.98 (ref 0.00–1.49)

## 2012-09-17 MED ORDER — INSULIN ASPART 100 UNIT/ML ~~LOC~~ SOLN
0.0000 [IU] | Freq: Three times a day (TID) | SUBCUTANEOUS | Status: DC
  Administered 2012-09-19 (×2): 2 [IU] via SUBCUTANEOUS
  Administered 2012-09-19: 1 [IU] via SUBCUTANEOUS
  Administered 2012-09-20: 2 [IU] via SUBCUTANEOUS
  Administered 2012-09-21: 1 [IU] via SUBCUTANEOUS

## 2012-09-17 MED ORDER — VANCOMYCIN HCL IN DEXTROSE 1-5 GM/200ML-% IV SOLN
1000.0000 mg | INTRAVENOUS | Status: DC
  Administered 2012-09-18 – 2012-09-20 (×3): 1000 mg via INTRAVENOUS
  Filled 2012-09-17 (×4): qty 200

## 2012-09-17 MED ORDER — SODIUM CHLORIDE 0.9 % IV SOLN
Freq: Once | INTRAVENOUS | Status: AC
  Administered 2012-09-18: via INTRAVENOUS

## 2012-09-17 MED ORDER — PIPERACILLIN-TAZOBACTAM 3.375 G IVPB
3.3750 g | Freq: Three times a day (TID) | INTRAVENOUS | Status: DC
  Administered 2012-09-18 – 2012-09-20 (×9): 3.375 g via INTRAVENOUS
  Filled 2012-09-17 (×12): qty 50

## 2012-09-17 MED ORDER — OXYCODONE HCL 5 MG PO TABS
5.0000 mg | ORAL_TABLET | Freq: Once | ORAL | Status: AC
  Administered 2012-09-17: 5 mg via ORAL
  Filled 2012-09-17: qty 1

## 2012-09-17 NOTE — ED Notes (Signed)
Patient says he started feeling SOB since this morning at 0600.  Patient's wife says he goes back and forth between being okay and being SOB.  The patient finally decided to come in.  The wife says the patient has a problem with fluid overload and they have to do a paracentesis to drain the fluid from his belly.  But today he doesn't feel like it is overload/

## 2012-09-17 NOTE — ED Notes (Signed)
Pt using urinal at bedside, able to stand without issue.Marland Kitchen

## 2012-09-17 NOTE — Telephone Encounter (Signed)
Pt called because he is very SOB. Has been worse today. Denies weight gain. Is significantly SOB at rest. Like when he came to the hospital a few weeks ago.   Advised him he should come to the ER at [facility], do not drive. Recommended EMS but he refused. Family will bring him.

## 2012-09-17 NOTE — ED Provider Notes (Signed)
History     CSN: [id number]  Arrival date & time 09/17/12  1659   First MD Initiated Contact with Patient 09/17/12 1807      Chief Complaint  Patient presents with  . Shortness of Breath    (Consider location/radiation/quality/duration/timing/severity/associated sxs/prior treatment) HPI Comments: Patient is a 67 year old man who says he developed shortness of breath this morning. He has recently been hospitalized, having been admitted from March 14 to March 23, with congestive heart failure and cirrhosis of the liver. While hospitalized he was treated with milrinone for congestive failure. He had therapeutic paracentesis. He was released on March 23, but had to return to have a therapeutic paracentesis done 2 days ago.  Patient is a 67 y.o. male presenting with shortness of breath. The history is provided by medical records, the patient and the spouse. No language interpreter was used.  Shortness of Breath Severity:  Moderate Onset quality: Recurrent. Duration:  8 hours Timing:  Constant Progression:  Unchanged Chronicity:  Recurrent Context comment:  Prior history of congestive heart failure and cirrhosis of the liver. Relieved by:  Nothing Ineffective treatments: He has taken the diuretic Lasix, and has also been treated intermittently with therapeutic paracentesis. Associated symptoms: no abdominal pain, no chest pain and no vomiting   Associated symptoms comment:  Ascites.   Past Medical History  Diagnosis Date  . Coronary artery disease   . Diabetes mellitus   . Hypercholesteremia   . Shortness of breath   . CHF (congestive heart failure)   . HTN (hypertension)   . GERD (gastroesophageal reflux disease)     Past Surgical History  Procedure Laterality Date  . Coronary artery bypass graft    . Carotid endarterectomy    . Cardiac catheterization    . Shoulder surgery    . Esophagogastroduodenoscopy N/A 09/07/2012    Procedure: ESOPHAGOGASTRODUODENOSCOPY (EGD);   Surgeon: Beverley Fiedler, MD;  Location[facility] ENDOSCOPY;  Service: Gastroenterology;  Laterality: N/A;  . Esophagogastroduodenoscopy N/A 09/08/2012    Procedure: ESOPHAGOGASTRODUODENOSCOPY (EGD);  Surgeon: Beverley Fiedler, MD;  Location: [facility] ENDOSCOPY;  Service: Gastroenterology;  Laterality: N/A;    Family History  Problem Relation Age of Onset  . Heart disease Father     History  Substance Use Topics  . Smoking status: Never Smoker   . Smokeless tobacco: Not on file  . Alcohol Use: No      Review of Systems  Constitutional: Negative.   HENT: Negative.   Eyes: Negative.   Respiratory: Positive for shortness of breath.   Cardiovascular: Negative.  Negative for chest pain, palpitations and leg swelling.  Gastrointestinal: Positive for abdominal distention. Negative for vomiting, abdominal pain and diarrhea.  Genitourinary: Negative.   Musculoskeletal: Negative.   Skin: Negative.   Neurological: Negative.   Psychiatric/Behavioral: Negative.     Allergies  Morphine and related  Home Medications   Current Outpatient Rx  Name  Route  Sig  Dispense  Refill  . aspirin EC 81 MG tablet   Oral   Take 81 mg by mouth daily.         . B Complex-C-Folic Acid (B COMPLEX-VITAMIN C-FOLIC ACID) 1 MG tablet   Oral   Take 1 tablet by mouth daily.          . carvedilol (COREG) 6.25 MG tablet   Oral   Take 1 tablet (6.25 mg total) by mouth 2 (two) times daily.   60 tablet   3   . cholecalciferol (VITAMIN D)  1000 UNITS tablet   Oral   Take 1,000 Units by mouth daily.         . Cyanocobalamin (VITAMIN B-12 PO)   Oral   Take 1 tablet by mouth daily.         Marland Kitchen docusate sodium (COLACE) 100 MG capsule   Oral   Take 100 mg by mouth at bedtime.         . ferrous sulfate 325 (65 FE) MG tablet   Oral   Take 325 mg by mouth daily.          . furosemide (LASIX) 40 MG tablet   Oral   Take 40 mg by mouth 2 (two) times daily.         . hydrALAZINE (APRESOLINE) 25 MG tablet    Oral   Take 1 tablet (25 mg total) by mouth every 8 (eight) hours.   90 tablet   0   . insulin glargine (LANTUS) 100 UNIT/ML injection   Subcutaneous   Inject 18 Units into the skin every morning.         . insulin lispro (HUMALOG) 100 UNIT/ML injection   Subcutaneous   Inject 2-5 Units into the skin 3 (three) times daily before meals. Based on sliding scale         . isosorbide mononitrate (IMDUR) 30 MG 24 hr tablet   Oral   Take 1 tablet (30 mg total) by mouth daily.   30 tablet   0   . Multiple Vitamin (MULITIVITAMIN WITH MINERALS) TABS   Oral   Take 1 tablet by mouth daily.         . nitroGLYCERIN (NITROSTAT) 0.4 MG SL tablet   Sublingual   Place 0.4 mg under the tongue every 5 (five) minutes as needed for chest pain. If pain continues after 3 doses call 911         . oxyCODONE (OXY IR/ROXICODONE) 5 MG immediate release tablet   Oral   Take 5 mg by mouth every 4 (four) hours as needed for pain.         . pantoprazole (PROTONIX) 40 MG tablet   Oral   Take 1 tablet (40 mg total) by mouth 2 (two) times daily.   60 tablet   0   . polyethylene glycol (MIRALAX / GLYCOLAX) packet   Oral   Take 17 g by mouth daily as needed (for constipation).          . potassium chloride SA (K-DUR,KLOR-CON) 20 MEQ tablet   Oral   Take 20 mEq by mouth daily.         . simvastatin (ZOCOR) 20 MG tablet   Oral   Take 20 mg by mouth every evening.         . sucralfate (CARAFATE) 1 G tablet   Oral   Take 1 g by mouth 5 (five) times daily.         . ONE TOUCH ULTRA TEST test strip                 BP 105/66  Pulse 78  Temp(Src) 99 F (37.2 C) (Oral)  SpO2 97%  Physical Exam  Nursing note and vitals reviewed. Constitutional: He is oriented to person, place, and time.  Elderly man who appears chronically ill.  HENT:  Head: Normocephalic and atraumatic.  Right Ear: External ear normal.  Left Ear: External ear normal.  Mouth/Throat: Oropharynx is clear  and moist.  Eyes: Conjunctivae and  EOM are normal. Pupils are equal, round, and reactive to light.  Neck: Normal range of motion. Neck supple.  Cardiovascular: Normal rate and regular rhythm.   Pulmonary/Chest:  No rales.  Lungs clear.  Abdominal: Soft. Bowel sounds are normal. He exhibits distension.  Musculoskeletal: Normal range of motion. He exhibits no edema and no tenderness.  Neurological: He is alert and oriented to person, place, and time.  No sensory or motor deficit.  Skin: Skin is warm and dry.  Psychiatric: He has a normal mood and affect. His behavior is normal.    ED Course  Procedures (including critical care time)  Labs Reviewed  PRO B NATRIURETIC PEPTIDE - Abnormal; Notable for the following:    Pro B Natriuretic peptide (BNP) 11010.0 (*)    All other components within normal limits  CBC  BASIC METABOLIC PANEL  TROPONIN I  CBC WITH DIFFERENTIAL  COMPREHENSIVE METABOLIC PANEL  URINALYSIS, ROUTINE W REFLEX MICROSCOPIC  PROTIME-INR  APTT  D-DIMER, QUANTITATIVE   6:20 PM Pt seen --> physical exam performed.  Old charts reviewed.  Lab workup ordered.  10:16 PM   Results for orders placed during the hospital encounter of 09/17/12  CBC      Result Value Range   WBC 8.2  4.0 - 10.5 K/uL   RBC 3.00 (*) 4.22 - 5.81 MIL/uL   Hemoglobin 8.3 (*) 13.0 - 17.0 g/dL   HCT 78.2 (*) 95.6 - 21.3 %   MCV 84.0  78.0 - 100.0 fL   MCH 27.7  26.0 - 34.0 pg   MCHC 32.9  30.0 - 36.0 g/dL   RDW 08.6 (*) 57.8 - 46.9 %   Platelets 310  150 - 400 K/uL  BASIC METABOLIC PANEL      Result Value Range   Sodium 134 (*) 135 - 145 mEq/L   Potassium 4.5  3.5 - 5.1 mEq/L   Chloride 97  96 - 112 mEq/L   CO2 27  19 - 32 mEq/L   Glucose, Bld 72  70 - 99 mg/dL   BUN 56 (*) 6 - 23 mg/dL   Creatinine, Ser 6.29 (*) 0.50 - 1.35 mg/dL   Calcium 8.7  8.4 - 52.8 mg/dL   GFR calc non Af Amer 29 (*) >90 mL/min   GFR calc Af Amer 34 (*) >90 mL/min  PRO B NATRIURETIC PEPTIDE      Result Value  Range   Pro B Natriuretic peptide (BNP) 11010.0 (*) 0 - 125 pg/mL  TROPONIN I      Result Value Range   Troponin I <0.30  <0.30 ng/mL  CBC WITH DIFFERENTIAL      Result Value Range   WBC 7.5  4.0 - 10.5 K/uL   RBC 3.17 (*) 4.22 - 5.81 MIL/uL   Hemoglobin 8.9 (*) 13.0 - 17.0 g/dL   HCT 41.3 (*) 24.4 - 01.0 %   MCV 86.1  78.0 - 100.0 fL   MCH 28.1  26.0 - 34.0 pg   MCHC 32.6  30.0 - 36.0 g/dL   RDW 27.2 (*) 53.6 - 64.4 %   Platelets 300  150 - 400 K/uL   Neutrophils Relative 58  43 - 77 %   Neutro Abs 4.3  1.7 - 7.7 K/uL   Lymphocytes Relative 29  12 - 46 %   Lymphs Abs 2.1  0.7 - 4.0 K/uL   Monocytes Relative 12  3 - 12 %   Monocytes Absolute 0.9  0.1 - 1.0 K/uL  Eosinophils Relative 1  0 - 5 %   Eosinophils Absolute 0.1  0.0 - 0.7 K/uL   Basophils Relative 0  0 - 1 %   Basophils Absolute 0.0  0.0 - 0.1 K/uL  COMPREHENSIVE METABOLIC PANEL      Result Value Range   Sodium 135  135 - 145 mEq/L   Potassium 4.4  3.5 - 5.1 mEq/L   Chloride 97  96 - 112 mEq/L   CO2 30  19 - 32 mEq/L   Glucose, Bld 67 (*) 70 - 99 mg/dL   BUN 55 (*) 6 - 23 mg/dL   Creatinine, Ser 1.61 (*) 0.50 - 1.35 mg/dL   Calcium 8.8  8.4 - 09.6 mg/dL   Total Protein 6.0  6.0 - 8.3 g/dL   Albumin 2.3 (*) 3.5 - 5.2 g/dL   AST 20  0 - 37 U/L   ALT 9  0 - 53 U/L   Alkaline Phosphatase 113  39 - 117 U/L   Total Bilirubin 0.3  0.3 - 1.2 mg/dL   GFR calc non Af Amer 29 (*) >90 mL/min   GFR calc Af Amer 33 (*) >90 mL/min  URINALYSIS, ROUTINE W REFLEX MICROSCOPIC      Result Value Range   Color, Urine YELLOW  YELLOW   APPearance CLEAR  CLEAR   Specific Gravity, Urine 1.012  1.005 - 1.030   pH 6.0  5.0 - 8.0   Glucose, UA NEGATIVE  NEGATIVE mg/dL   Hgb urine dipstick NEGATIVE  NEGATIVE   Bilirubin Urine NEGATIVE  NEGATIVE   Ketones, ur NEGATIVE  NEGATIVE mg/dL   Protein, ur NEGATIVE  NEGATIVE mg/dL   Urobilinogen, UA 1.0  0.0 - 1.0 mg/dL   Nitrite NEGATIVE  NEGATIVE   Leukocytes, UA NEGATIVE  NEGATIVE   PROTIME-INR      Result Value Range   Prothrombin Time 12.9  11.6 - 15.2 seconds   INR 0.98  0.00 - 1.49  APTT      Result Value Range   aPTT 36  24 - 37 seconds  D-DIMER, QUANTITATIVE      Result Value Range   D-Dimer, Quant 3.76 (*) 0.00 - 0.48 ug/mL-FEU  GLUCOSE, CAPILLARY      Result Value Range   Glucose-Capillary 65 (*) 70 - 99 mg/dL  GLUCOSE, CAPILLARY      Result Value Range   Glucose-Capillary 129 (*) 70 - 99 mg/dL  POCT I-STAT TROPONIN I      Result Value Range   Troponin i, poc 0.04  0.00 - 0.08 ng/mL   Comment 3             Dg Chest 2 View  09/17/2012  *RADIOLOGY REPORT*  Clinical Data: Shortness of breath.  CHEST - 2 VIEW  Comparison: 09/01/2012.  Findings: A poor inspiration is again demonstrated.  The heart remains grossly normal in size with post CABG changes.  Interval mild patchy opacity at the left lung base and increased patchy opacity in the right lung.  Stable right pleural fluid.  Diffuse osteopenia and right shoulder postoperative changes.  IMPRESSION:  1.  Interval small amount of patchy atelectasis or pneumonia at the left lung base. 2.  Progressive patchy opacity in the right lung, suspicious for pneumonia. 3.  Stable right pleural fluid.   Original Report Authenticated By: Beckie Salts, M.D.    10:17 PM  Review of pt's lab tests shows anemia with Hb 8.9 and Hct 27.3.  Chemistries showed renal  insufficiency with BUN 55 and creatinine 2.26.  Glucose was low at 67.  Albumin low at 2.3.  D dimer high at 3.76.  INR 0.98.  UA negative.  Chest x-ray shows atelectasis at left base and opacity in right lung, larger than when seen on films 3/16//2014, ? Pneumonia.  With his renal insufficiency he cannot get a CT angio of chest, will need V/Q scan in AM.   10:52 PM Case discussed with Dr. Susie Cassette.  She advised that pt could have noncontrast CT of chest, and then be admitted to a telemetry unit.  CT of chest showed right lower lobe consolidation, consistent with  pneumonia. It also showed right posterior rib fractures.  1. Shortness of breath   2. Cirrhosis of liver   3.  HCAP 4.  Rib fractures   Pt started on IV antibiotics by Dr. Susie Cassette after results of CT of chest were known.            Carleene Cooper III, MD 09/18/12 [phone]

## 2012-09-17 NOTE — H&P (Addendum)
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 He was admitted to hospital earlier this month for the same reason. He was admitted with acute on chronic systolic CHF. Cardiac MRI showed EF 37%. He was started on milrinone and diuresed while in the hospital. RHC on milrinone showed relatively compensated filling pressures and he was converted over to po Lasix. He was noted to have significant ascites and had a therapeutic paracentesis while in the hospital draining 4.8 L. Imaging showed cirrhosis. Hepatitis panel and AFP were negative. He also was anemic and was followed by GI with EGD and APC of GAVE.  He is now back home with his wife. He feels like his abdomen is filling up with fluid again. He is walking with a walker and getting home PT. He is short of breath after walking 75-100 feet. No chest pain. No orthopnea/PND. He has followup with GI in a couple of weeks.   ECG: NSR with low voltage P waves, old inferior and anterolateral MIs       Review of Systems: negative for the following  Constitutional: Denies fever, chills, diaphoresis, appetite change and fatigue.  HEENT: Denies photophobia, eye pain, redness, hearing loss, ear pain, congestion, sore throat, rhinorrhea, sneezing, mouth sores, trouble swallowing, neck pain, neck stiffness and tinnitus.  Respiratory: Denies SOB, DOE, cough, chest tightness, and wheezing.  Cardiovascular: Denies chest pain, palpitations and leg swelling.  Gastrointestinal: Denies nausea, vomiting, abdominal pain, diarrhea, constipation, blood in stool and abdominal distention.  Genitourinary: Denies dysuria, urgency, frequency, hematuria, flank pain and difficulty  urinating.  Musculoskeletal: Denies myalgias, back pain, joint swelling, arthralgias and gait problem.  Skin: Denies pallor, rash and wound.  Neurological: Denies dizziness, seizures, syncope, weakness, light-headedness, numbness and headaches.  Hematological: Denies adenopathy. Easy bruising, personal or family bleeding history  Psychiatric/Behavioral: Denies suicidal ideation, mood changes, confusion, nervousness, sleep disturbance and agitation       Past Medical History  Diagnosis Date  . Coronary artery disease   . Diabetes mellitus   . Hypercholesteremia   . Shortness of breath   . CHF (congestive heart failure)   . HTN (hypertension)   . GERD (gastroesophageal reflux disease)    1. Type II diabetes  2. HTN  3. Hyperlipidemia  4. CAD:  - CABG 1999  - PCI 2007 to SVG-OM  - Last cath 2008 showed patent LIMA-LAD, SVG-OM, and SVG-PDA. Patient had PTCA to PLV at that time.  5. Systolic CHF: Echo in 2009 showed EF 50-55%. ? EF 40% on echo subsequently in [place]. Echo in 3/14 at [facility] was a very difficult study. Cardiac MRI (without contrast given elevated creatinine) showed EF 37% with mid-apical anterior, anteroseptal, and inferior moderate to severe hypokinesis. RHC (3/14) with mean RA 7, PA 31/19, mean PCWP 21, CI 3.66 (on milrinone).  6. Obesity  7. CKD  8. Cirrhosis: Possibly due to NASH. No h/o ETOH. Hepatitis panel and AFP negative in 3/14. He has ascites with prior paracentesis.  9. GAVE with anemia: EGD with APC in 3/14. He is on PPI.  10. CKD  11. H/o CEA   Past Surgical History  Procedure Laterality Date  . Coronary artery bypass graft    . Carotid  endarterectomy    . Cardiac catheterization    . Shoulder surgery    . Esophagogastroduodenoscopy N/A 09/07/2012    Procedure: ESOPHAGOGASTRODUODENOSCOPY (EGD);  Surgeon: Beverley Fiedler, MD;  Location: [facility] ENDOSCOPY;  Service: Gastroenterology;  Laterality: N/A;  . Esophagogastroduodenoscopy N/A 09/08/2012    Procedure:  ESOPHAGOGASTRODUODENOSCOPY (EGD);  Surgeon: Beverley Fiedler, MD;  [facility] ENDOSCOPY;  Service: Gastroenterology;  Laterality: N/A;      Social History:  reports that he has never smoked. He does not have any smokeless tobacco history on file. He reports that he does not drink alcohol or use illicit drugs.    Allergies  Allergen Reactions  . Morphine And Related Other (See Comments)    Shakes & hallucinations    Family History  Problem Relation Age of Onset  . Heart disease Father      Prior to Admission medications   Medication Sig Start Date End Date Taking? Authorizing Provider  aspirin EC 81 MG tablet Take 81 mg by mouth daily.   Yes Historical Provider, MD  B Complex-C-Folic Acid (B COMPLEX-VITAMIN C-FOLIC ACID) 1 MG tablet Take 1 tablet by mouth daily.  04/12/12  Yes Historical Provider, MD  carvedilol (COREG) 6.25 MG tablet Take 1 tablet (6.25 mg total) by mouth 2 (two) times daily. 09/13/12  Yes Laurey Morale, MD  cholecalciferol (VITAMIN D) 1000 UNITS tablet Take 1,000 Units by mouth daily.   Yes Historical Provider, MD  Cyanocobalamin (VITAMIN B-12 PO) Take 1 tablet by mouth daily.   Yes Historical Provider, MD  docusate sodium (COLACE) 100 MG capsule Take 100 mg by mouth at bedtime.   Yes Historical Provider, MD  ferrous sulfate 325 (65 FE) MG tablet Take 325 mg by mouth daily.  01/23/12 01/22/13 Yes Historical Provider, MD  furosemide (LASIX) 40 MG tablet Take 40 mg by mouth 2 (two) times daily.   Yes Historical Provider, MD  hydrALAZINE (APRESOLINE) 25 MG tablet Take 1 tablet (25 mg total) by mouth every 8 (eight) hours. 09/10/12  Yes Nishant Dhungel, MD  insulin glargine (LANTUS) 100 UNIT/ML injection Inject 18 Units into the skin every morning.   Yes Historical Provider, MD  insulin lispro (HUMALOG) 100 UNIT/ML injection Inject 2-5 Units into the skin 3 (three) times daily before meals. Based on sliding scale   Yes Historical Provider, MD  isosorbide mononitrate (IMDUR)  30 MG 24 hr tablet Take 1 tablet (30 mg total) by mouth daily. 09/10/12  Yes Nishant Dhungel, MD  Multiple Vitamin (MULITIVITAMIN WITH MINERALS) TABS Take 1 tablet by mouth daily.   Yes Historical Provider, MD  nitroGLYCERIN (NITROSTAT) 0.4 MG SL tablet Place 0.4 mg under the tongue every 5 (five) minutes as needed for chest pain. If pain continues after 3 doses call 911   Yes Historical Provider, MD  oxyCODONE (OXY IR/ROXICODONE) 5 MG immediate release tablet Take 5 mg by mouth every 4 (four) hours as needed for pain.   Yes Historical Provider, MD  pantoprazole (PROTONIX) 40 MG tablet Take 1 tablet (40 mg total) by mouth 2 (two) times daily. 09/10/12  Yes Nishant Dhungel, MD  polyethylene glycol (MIRALAX / GLYCOLAX) packet Take 17 g by mouth daily as needed (for constipation).    Yes Historical Provider, MD  potassium chloride SA (K-DUR,KLOR-CON) 20 MEQ tablet Take 20 mEq by mouth daily.   Yes Historical Provider, MD  simvastatin (ZOCOR) 20 MG tablet Take 20 mg by mouth every evening.   Yes Historical Provider, MD  sucralfate (CARAFATE)  1 G tablet Take 1 g by mouth 5 (five) times daily.   Yes Historical Provider, MD  ONE TOUCH ULTRA TEST test strip  07/26/12   Historical Provider, MD     Physical Exam: Filed Vitals:   09/17/12 2000 09/17/12 2030 09/17/12 2130 09/17/12 2200  BP: 112/48 113/77 111/40 111/43  Pulse: 41 82 43 43  Temp:      TempSrc:      Resp: 21 23 18 21   SpO2: 98% 99% 99% 99%     Constitutional: Vital signs reviewed. Patient is a well-developed and well-nourished in no acute distress and cooperative with exam. Alert and oriented x3.  Head: Normocephalic and atraumatic  Ear: TM normal bilaterally  Mouth: no erythema or exudates, MMM  Eyes: PERRL, EOMI, conjunctivae normal, No scleral icterus.  Neck: Supple, Trachea midline normal ROM, No JVD, mass, thyromegaly, or carotid bruit present.  Cardiovascular: RRR, S1 normal, S2 normal, no MRG, pulses symmetric and intact  bilaterally  Pulmonary/Chest: CTAB, no wheezes, rales, or rhonchi  Abdominal: Soft. Non-tender,distension. , bowel sounds are normal, no masses, organomegaly, or guarding present.  GU: no CVA tenderness Musculoskeletal: No joint deformities, erythema, or stiffness, ROM full and no nontender Ext: no edema and no cyanosis, pulses palpable bilaterally (DP and PT)  Hematology: no cervical, inginal, or axillary adenopathy.  Neurological: A&O x3, Strenght is normal and symmetric bilaterally, cranial nerve II-XII are grossly intact, no focal motor deficit, sensory intact to light touch bilaterally.  Skin: Warm, dry and intact. No rash, cyanosis, or clubbing.  Psychiatric: Normal mood and affect. speech and behavior is normal. Judgment and thought content normal. Cognition and memory are normal.       Labs on Admission:    Basic Metabolic Panel:  Recent Labs Lab 09/17/12 1749 09/17/12 1822  NA 134* 135  K 4.5 4.4  CL 97 97  CO2 27 30  GLUCOSE 72 67*  BUN 56* 55*  CREATININE 2.22* 2.26*  CALCIUM 8.7 8.8   Liver Function Tests:  Recent Labs Lab 09/17/12 1822  AST 20  ALT 9  ALKPHOS 113  BILITOT 0.3  PROT 6.0  ALBUMIN 2.3*   No results found for this basename: LIPASE, AMYLASE,  in the last 168 hours No results found for this basename: AMMONIA,  in the last 168 hours CBC:  Recent Labs Lab 09/17/12 1749 09/17/12 1822  WBC 8.2 7.5  NEUTROABS  --  4.3  HGB 8.3* 8.9*  HCT 25.2* 27.3*  MCV 84.0 86.1  PLT 310 300   Cardiac Enzymes:  Recent Labs Lab 09/17/12 1800  TROPONINI <0.30    BNP (last 3 results)  Recent Labs  09/01/12 0430 09/17/12 1719  PROBNP 10584.0* 11010.0*      CBG:  Recent Labs Lab 09/17/12 2013 09/17/12 2119  GLUCAP 65* 129*    Radiological Exams on Admission: Dg Chest 2 View  09/17/2012  *RADIOLOGY REPORT*  Clinical Data: Shortness of breath.  CHEST - 2 VIEW  Comparison: 09/01/2012.  Findings: A poor inspiration is again  demonstrated.  The heart remains grossly normal in size with post CABG changes.  Interval mild patchy opacity at the left lung base and increased patchy opacity in the right lung.  Stable right pleural fluid.  Diffuse osteopenia and right shoulder postoperative changes.  IMPRESSION:  1.  Interval small amount of patchy atelectasis or pneumonia at the left lung base. 2.  Progressive patchy opacity in the right lung, suspicious for pneumonia. 3.  Stable right  pleural fluid.   Original Report Authenticated By: Beckie Salts, M.D.     EKG: Independently reviewed. Pending   Assessment/Plan Principal Problem:   Shortness of breath Active Problems:   Anemia   Renal failure (ARF), acute on chronic   Diabetes type 2, uncontrolled   CAD (coronary artery disease), S/P CABG & stents   GAVE (gastric antral vascular ectasia)   Cirrhosis   Chronic systolic CHF (congestive heart failure)   Assessment/Plan:  1. shortness of breath secondary to CHF exacerbation versus recurrent ascites: Chronic systolic CHF, NYHA class III symptoms. EF 37% with wall motion abnormalities on cardiac MRI (echo was difficult study). He is volume overloaded on exam   (more so than when he left the hospital).  -Cardiology recently increased Lasix to 80 mg bid.  -- Continue current hydralazine/Imdur (not on ACEI at this time with elevated creatinine).  Cardiology also increased Coreg to 6.25 mg bid.  His abdominal looks distended, he may need another paracentesis,  chest x-ray suggestive of pneumonia, given recent hospitalization we'll obtain a CT of the chest without contrast to insure that the patient does not have pneumonia versus loculated pleural effusion. Empirically start him on broad-spectrum antibiotics pending vancomycin/Zosyn   2. Cirrhosis: Suspect NASH. Workup negative for viral hepatitis and never a drinker. He has more ascites on exam and his abdomen feels distended.  Arrange for ultrasound-guided paracentesis in  the morning May need albumin given recent large-volume paracentesis We'll avoid another large volume paracentesis, as this can compromise his renal function, unless ultrasound shows a large amount of ascites  Chest 3. Anemia: Suspect upper GI bleeding from GAVE. Continue PPI  4. CKD: Stage III, Suspect there is a cardiorenal component. Will have to follow creatinine carefully with diuresis. May need a nephrology consultation outpatient  5. CAD: He would not be a good cath candidate given his creatinine. Continue Imdur, aspirin, statin, Coreg  6. elevated d-dimer, will obtain bilateral Dopplers as well as a VQ scan to rule out DVT and PE  Code Status:   full Family Communication: bedside Disposition Plan: admit   Time spent: 70 mins   [facility] Pager [phone]  If 7PM-7AM, please contact night-coverage [email or web address] Password [email or web address] 09/17/2012, 11:01 PM

## 2012-09-17 NOTE — ED Notes (Signed)
Family at bedside. 

## 2012-09-17 NOTE — Progress Notes (Signed)
ANTIBIOTIC CONSULT NOTE - INITIAL  Pharmacy Consult for vancomycin and zosyn Indication: rule out pneumonia  Allergies  Allergen Reactions  . Morphine And Related Other (See Comments)    Shakes & hallucinations    Patient Measurements:    Body Weight: 81.2 kg  Vital Signs: Temp: 99 F (37.2 C) (03/30 1704) Temp src: Oral (03/30 1704) BP: 111/43 mmHg (03/30 2200) Pulse Rate: 43 (03/30 2200) Intake/Output from previous day:   Intake/Output from this shift:    Labs:  Recent Labs  09/17/12 1749 09/17/12 1822  WBC 8.2 7.5  HGB 8.3* 8.9*  PLT 310 300  CREATININE 2.22* 2.26*   The CrCl is unknown because both a height and weight (above a minimum accepted value) are required for this calculation. No results found for this basename: VANCOTROUGH, VANCOPEAK, VANCORANDOM, GENTTROUGH, GENTPEAK, GENTRANDOM, TOBRATROUGH, TOBRAPEAK, TOBRARND, AMIKACINPEAK, AMIKACINTROU, AMIKACIN,  in the last 72 hours   Microbiology: No results found for this or any previous visit (from the past 720 hour(s)).  Medical History: Past Medical History  Diagnosis Date  . Coronary artery disease   . Diabetes mellitus   . Hypercholesteremia   . Shortness of breath   . CHF (congestive heart failure)   . HTN (hypertension)   . GERD (gastroesophageal reflux disease)     Medications:   (Not in a hospital admission) Assessment: 67 yo man to start vancomycin and zosyn r/o PNA.  CrCl ~ 30 ml/min  Goal of Therapy:  Vancomycin trough level 15-20 mcg/ml  Plan:  Zosyn 3.375 gm IV q8 hours. Vancomycin 1000 mg IV q24 hours. F/u renal function, cultures and clinical course. Check vanc trough when appropriate.  Dick Hark Poteet 09/17/2012,11:11 PM

## 2012-09-17 NOTE — ED Notes (Signed)
Pt reports having sob since this am. Denies any cp. No swelling noted, no resp distress noted at triage, spo2 97% and ekg being done.

## 2012-09-18 ENCOUNTER — Inpatient Hospital Stay (HOSPITAL_COMMUNITY): Payer: PRIVATE HEALTH INSURANCE

## 2012-09-18 DIAGNOSIS — R0602 Shortness of breath: Secondary | ICD-10-CM

## 2012-09-18 DIAGNOSIS — K8020 Calculus of gallbladder without cholecystitis without obstruction: Secondary | ICD-10-CM

## 2012-09-18 LAB — COMPREHENSIVE METABOLIC PANEL
ALT: 8 U/L (ref 0–53)
AST: 18 U/L (ref 0–37)
Albumin: 2.3 g/dL — ABNORMAL LOW (ref 3.5–5.2)
CO2: 30 mEq/L (ref 19–32)
Calcium: 8.6 mg/dL (ref 8.4–10.5)
Sodium: 133 mEq/L — ABNORMAL LOW (ref 135–145)
Total Protein: 6 g/dL (ref 6.0–8.3)

## 2012-09-18 LAB — HEMOGLOBIN A1C
Hgb A1c MFr Bld: 6.3 % — ABNORMAL HIGH (ref <5.7)
Mean Plasma Glucose: 134 mg/dL — ABNORMAL HIGH (ref <117)

## 2012-09-18 LAB — GLUCOSE, CAPILLARY
Glucose-Capillary: 145 mg/dL — ABNORMAL HIGH (ref 70–99)
Glucose-Capillary: 94 mg/dL (ref 70–99)
Glucose-Capillary: 105 mg/dL — ABNORMAL HIGH (ref 70–99)
Glucose-Capillary: 197 mg/dL — ABNORMAL HIGH (ref 70–99)

## 2012-09-18 LAB — TROPONIN I
Troponin I: <0.3 ng/mL (ref <0.30)
Troponin I: <0.3 ng/mL (ref <0.30)

## 2012-09-18 LAB — CBC
HCT: 28.3 % — ABNORMAL LOW (ref 39.0–52.0)
Hemoglobin: 9.1 g/dL — ABNORMAL LOW (ref 13.0–17.0)
RBC: 3.29 MIL/uL — ABNORMAL LOW (ref 4.22–5.81)
WBC: 7.4 10*3/uL (ref 4.0–10.5)

## 2012-09-18 LAB — MAGNESIUM: Magnesium: 2.5 mg/dL (ref 1.5–2.5)

## 2012-09-18 LAB — PATHOLOGIST SMEAR REVIEW

## 2012-09-18 LAB — PROTIME-INR: Prothrombin Time: 12.8 seconds (ref 11.6–15.2)

## 2012-09-18 MED ORDER — ONDANSETRON HCL 4 MG/2ML IJ SOLN: 4.0000 mg | Freq: Four times a day (QID) | INTRAMUSCULAR | Status: DC | PRN

## 2012-09-18 MED ORDER — TECHNETIUM TO 99M ALBUMIN AGGREGATED
6.0000 | Freq: Once | INTRAVENOUS | Status: AC | PRN
  Administered 2012-09-18: 6 via INTRAVENOUS

## 2012-09-18 MED ORDER — OXYCODONE HCL 5 MG PO TABS: 5.0000 mg | ORAL_TABLET | ORAL | Status: DC | PRN

## 2012-09-18 MED ORDER — FUROSEMIDE 80 MG PO TABS
80.0000 mg | ORAL_TABLET | Freq: Two times a day (BID) | ORAL | Status: DC
Start: 2012-09-18 — End: 2012-09-18

## 2012-09-18 MED ORDER — CARVEDILOL 6.25 MG PO TABS
6.2500 mg | ORAL_TABLET | Freq: Two times a day (BID) | ORAL | Status: DC
  Administered 2012-09-18 – 2012-09-21 (×5): 6.25 mg via ORAL
  Filled 2012-09-18 (×9): qty 1

## 2012-09-18 MED ORDER — ISOSORBIDE MONONITRATE ER 30 MG PO TB24
30.0000 mg | ORAL_TABLET | Freq: Every day | ORAL | Status: DC
  Administered 2012-09-18 – 2012-09-21 (×4): 30 mg via ORAL
  Filled 2012-09-18 (×4): qty 1

## 2012-09-18 MED ORDER — LEVALBUTEROL HCL 0.63 MG/3ML IN NEBU: 0.6300 mg | INHALATION_SOLUTION | Freq: Four times a day (QID) | RESPIRATORY_TRACT | Status: DC | PRN

## 2012-09-18 MED ORDER — NITROGLYCERIN 0.4 MG SL SUBL: 0.4000 mg | SUBLINGUAL_TABLET | SUBLINGUAL | Status: DC | PRN

## 2012-09-18 MED ORDER — ASPIRIN EC 81 MG PO TBEC
81.0000 mg | DELAYED_RELEASE_TABLET | Freq: Every day | ORAL | Status: DC
  Administered 2012-09-18 – 2012-09-21 (×4): 81 mg via ORAL
  Filled 2012-09-18 (×4): qty 1

## 2012-09-18 MED ORDER — INSULIN GLARGINE 100 UNIT/ML ~~LOC~~ SOLN
18.0000 [IU] | Freq: Every morning | SUBCUTANEOUS | Status: DC
  Administered 2012-09-19 – 2012-09-20 (×2): 18 [IU] via SUBCUTANEOUS
  Filled 2012-09-18 (×3): qty 0.18

## 2012-09-18 MED ORDER — PANTOPRAZOLE SODIUM 40 MG PO TBEC: 40.0000 mg | DELAYED_RELEASE_TABLET | Freq: Every day | ORAL | Status: DC

## 2012-09-18 MED ORDER — ENOXAPARIN SODIUM 30 MG/0.3ML ~~LOC~~ SOLN
30.0000 mg | SUBCUTANEOUS | Status: DC
  Filled 2012-09-18: qty 0.3

## 2012-09-18 MED ORDER — HYDRALAZINE HCL 25 MG PO TABS: 25.0000 mg | ORAL_TABLET | Freq: Three times a day (TID) | ORAL | Status: DC

## 2012-09-18 MED ORDER — FERROUS SULFATE 325 (65 FE) MG PO TABS
325.0000 mg | ORAL_TABLET | Freq: Every day | ORAL | Status: DC
  Administered 2012-09-18 – 2012-09-21 (×4): 325 mg via ORAL
  Filled 2012-09-18 (×4): qty 1

## 2012-09-18 MED ORDER — SODIUM CHLORIDE 0.9 % IV SOLN: INTRAVENOUS | Status: AC

## 2012-09-18 MED ORDER — ISOSORBIDE MONONITRATE ER 30 MG PO TB24: 30.0000 mg | ORAL_TABLET | Freq: Every day | ORAL | Status: DC

## 2012-09-18 MED ORDER — ASPIRIN 81 MG PO CHEW: 81.0000 mg | CHEWABLE_TABLET | Freq: Every day | ORAL | Status: DC

## 2012-09-18 MED ORDER — POLYETHYLENE GLYCOL 3350 17 G PO PACK
17.0000 g | PACK | Freq: Every day | ORAL | Status: DC | PRN
  Filled 2012-09-18: qty 1

## 2012-09-18 MED ORDER — RANOLAZINE ER 500 MG PO TB12
1000.0000 mg | ORAL_TABLET | Freq: Two times a day (BID) | ORAL | Status: DC
  Administered 2012-09-19 – 2012-09-21 (×3): 1000 mg via ORAL
  Filled 2012-09-18 (×8): qty 2

## 2012-09-18 MED ORDER — INSULIN GLARGINE 100 UNIT/ML ~~LOC~~ SOLN: 15.0000 [IU] | Freq: Once | SUBCUTANEOUS | Status: DC

## 2012-09-18 MED ORDER — SODIUM CHLORIDE 0.9 % IJ SOLN
3.0000 mL | Freq: Two times a day (BID) | INTRAMUSCULAR | Status: DC
  Administered 2012-09-19 – 2012-09-21 (×3): 3 mL via INTRAVENOUS

## 2012-09-18 MED ORDER — ONDANSETRON HCL 4 MG PO TABS: 4.0000 mg | ORAL_TABLET | Freq: Four times a day (QID) | ORAL | Status: DC | PRN

## 2012-09-18 MED ORDER — OXYCODONE HCL 5 MG PO TABS
5.0000 mg | ORAL_TABLET | Freq: Four times a day (QID) | ORAL | Status: DC | PRN
  Administered 2012-09-18 – 2012-09-21 (×10): 5 mg via ORAL
  Filled 2012-09-18 (×10): qty 1

## 2012-09-18 MED ORDER — PANTOPRAZOLE SODIUM 40 MG PO TBEC
40.0000 mg | DELAYED_RELEASE_TABLET | Freq: Two times a day (BID) | ORAL | Status: DC
  Administered 2012-09-18 – 2012-09-21 (×8): 40 mg via ORAL
  Filled 2012-09-18 (×9): qty 1

## 2012-09-18 MED ORDER — SUCRALFATE 1 G PO TABS
1.0000 g | ORAL_TABLET | Freq: Every day | ORAL | Status: DC
  Administered 2012-09-18 – 2012-09-21 (×16): 1 g via ORAL
  Filled 2012-09-18 (×21): qty 1

## 2012-09-18 MED ORDER — POTASSIUM CHLORIDE CRYS ER 20 MEQ PO TBCR
20.0000 meq | EXTENDED_RELEASE_TABLET | Freq: Every day | ORAL | Status: DC
  Administered 2012-09-18 – 2012-09-19 (×2): 20 meq via ORAL
  Filled 2012-09-18 (×5): qty 1

## 2012-09-18 MED ORDER — ENOXAPARIN SODIUM 40 MG/0.4ML ~~LOC~~ SOLN
40.0000 mg | SUBCUTANEOUS | Status: DC
  Administered 2012-09-18 – 2012-09-21 (×4): 40 mg via SUBCUTANEOUS
  Filled 2012-09-18 (×4): qty 0.4

## 2012-09-18 MED ORDER — FUROSEMIDE 80 MG PO TABS
80.0000 mg | ORAL_TABLET | Freq: Two times a day (BID) | ORAL | Status: DC
  Administered 2012-09-18 – 2012-09-21 (×7): 80 mg via ORAL
  Filled 2012-09-18 (×9): qty 1

## 2012-09-18 MED ORDER — CARVEDILOL 3.125 MG PO TABS: 3.1250 mg | ORAL_TABLET | Freq: Two times a day (BID) | ORAL | Status: DC

## 2012-09-18 MED ORDER — HYDRALAZINE HCL 25 MG PO TABS
25.0000 mg | ORAL_TABLET | Freq: Three times a day (TID) | ORAL | Status: DC
  Administered 2012-09-18 – 2012-09-19 (×4): 25 mg via ORAL
  Filled 2012-09-18 (×8): qty 1

## 2012-09-18 MED ORDER — SIMVASTATIN 20 MG PO TABS
20.0000 mg | ORAL_TABLET | Freq: Every evening | ORAL | Status: DC
  Administered 2012-09-18 – 2012-09-20 (×3): 20 mg via ORAL
  Filled 2012-09-18 (×4): qty 1

## 2012-09-18 MED ORDER — TECHNETIUM TC 99M DIETHYLENETRIAME-PENTAACETIC ACID: 40.0000 | Freq: Once | INTRAVENOUS | Status: AC | PRN

## 2012-09-18 MED ORDER — SIMVASTATIN 20 MG PO TABS: 20.0000 mg | ORAL_TABLET | Freq: Every day | ORAL | Status: DC

## 2012-09-18 NOTE — Progress Notes (Signed)
Utilization Review Completed.[name] T3/31/2014  

## 2012-09-18 NOTE — Progress Notes (Signed)
VASCULAR LAB PRELIMINARY  PRELIMINARY  PRELIMINARY  PRELIMINARY  Bilateral lower extremity venous duplex  completed.    Preliminary report:  Bilateral:  No evidence of DVT, superficial thrombosis, or Baker's Cyst.    Javin Nong, RVT 09/18/2012, 8:57 AM

## 2012-09-18 NOTE — Progress Notes (Signed)
Nutrition Brief Note  Patient identified on the Malnutrition Screening Tool (MST) Report.  Patient has lost weight since May 2013, however, not significant for time frame and desirable given previous obese state.  Wt Readings from Last 10 Encounters:  09/18/12 169 lb 1.5 oz (76.7 kg)  09/13/12 179 lb (81.194 kg)  09/10/12 174 lb 6.4 oz (79.107 kg)  09/10/12 174 lb 6.4 oz (79.107 kg)  09/10/12 174 lb 6.4 oz (79.107 kg)  09/10/12 174 lb 6.4 oz (79.107 kg)  11/02/11 200 lb (90.719 kg)    Body mass index is 26.48 kg/(m^2). Patient meets criteria for Overweight based on current BMI.   Current diet order is NPO; patient was consuming 75% on a Heart Healthy diet prior to NPO status.  Labs and medications reviewed.   No nutrition interventions warranted at this time. If nutrition issues arise, please consult RD.   Maureen Chatters, RD, LDN Pager #: [phone] After-Hours Pager #: [phone]

## 2012-09-18 NOTE — Consult Note (Signed)
MALEEK Woodard [birth year]  [id number].    Requesting MD: Dr. 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 He was admitted to hospital earlier this month for the same reason. He was admitted with acute on chronic systolic CHF. Cardiac MRI showed EF 37%. He was started on milrinone and diuresed while in the hospital.  He was noted to have significant ascites and had a therapeutic paracentesis while in the hospital draining 4.8 L. Imaging showed cirrhosis. Hepatitis panel and AFP were negative. He also was anemic and was followed by GI with EGD and APC of GAVE.   He was home with his wife, but noted his abdomen filling up with fluid again.  He is walking with a walker and getting home PT.  He is short of breath after walking 75-100 feet.  No chest pain.  No orthopnea/PND. He has followup with GI in a couple of weeks.   Incidentally he was found to have gallstones on abdominal ultrasound.  He also has some mild RUQ abdominal pain (burning), no N/V.  Having normal BM's.  Denies post prandial abdominal pain, emesis, or biliary colic.  Tolerating clear liquids.    Family History  Problem Relation Age of Onset  . Heart disease Father     Past Medical History  Diagnosis Date  . Coronary artery disease   . Diabetes mellitus   . Hypercholesteremia   . Shortness of breath   . CHF (congestive heart failure)   . HTN (hypertension)   . GERD (gastroesophageal reflux disease)     Past Surgical History  Procedure Laterality Date  . Coronary artery bypass graft    . Carotid endarterectomy    . Cardiac catheterization    . Shoulder surgery    . Esophagogastroduodenoscopy N/A 09/07/2012    Procedure: ESOPHAGOGASTRODUODENOSCOPY (EGD);  Surgeon: Beverley Fiedler, MD;  Location: [facility] ENDOSCOPY;  Service: Gastroenterology;  Laterality: N/A;  .  Esophagogastroduodenoscopy N/A 09/08/2012    Procedure: ESOPHAGOGASTRODUODENOSCOPY (EGD);  Surgeon: Beverley Fiedler, MD;  Location: [facility] ENDOSCOPY;  Service: Gastroenterology;  Laterality: N/A;    Social History:  reports that he has never smoked. He does not have any smokeless tobacco history on file. He reports that he does not drink alcohol or use illicit drugs.  Allergies:  Allergies  Allergen Reactions  . Morphine And Related Other (See Comments)    Shakes & hallucinations    Medications Prior to Admission  Medication Sig Dispense Refill  . aspirin EC 81 MG tablet Take 81 mg by mouth daily.      . B Complex-C-Folic Acid (B COMPLEX-VITAMIN C-FOLIC ACID) 1 MG tablet Take 1 tablet by mouth daily.       . carvedilol (COREG) 6.25 MG tablet Take 1 tablet (6.25 mg total) by mouth 2 (two) times daily.  60 tablet  3  . cholecalciferol (VITAMIN D) 1000 UNITS tablet Take 1,000 Units by mouth daily.      . Cyanocobalamin (VITAMIN B-12 PO) Take 1 tablet by mouth daily.      Marland Kitchen docusate sodium (COLACE) 100 MG capsule Take 100 mg by mouth at bedtime.      . ferrous sulfate 325 (65 FE) MG tablet Take 325 mg by mouth daily.       . furosemide (LASIX) 40 MG tablet Take 40 mg by mouth 2 (two) times  daily.      . hydrALAZINE (APRESOLINE) 25 MG tablet Take 1 tablet (25 mg total) by mouth every 8 (eight) hours.  90 tablet  0  . insulin glargine (LANTUS) 100 UNIT/ML injection Inject 18 Units into the skin every morning.      . insulin lispro (HUMALOG) 100 UNIT/ML injection Inject 2-5 Units into the skin 3 (three) times daily before meals. Based on sliding scale      . isosorbide mononitrate (IMDUR) 30 MG 24 hr tablet Take 1 tablet (30 mg total) by mouth daily.  30 tablet  0  . Multiple Vitamin (MULITIVITAMIN WITH MINERALS) TABS Take 1 tablet by mouth daily.      . nitroGLYCERIN (NITROSTAT) 0.4 MG SL tablet Place 0.4 mg under the tongue every 5 (five) minutes as needed for chest pain. If pain continues after 3  doses call 911      . oxyCODONE (OXY IR/ROXICODONE) 5 MG immediate release tablet Take 5 mg by mouth every 4 (four) hours as needed for pain.      . pantoprazole (PROTONIX) 40 MG tablet Take 1 tablet (40 mg total) by mouth 2 (two) times daily.  60 tablet  0  . polyethylene glycol (MIRALAX / GLYCOLAX) packet Take 17 g by mouth daily as needed (for constipation).       . potassium chloride SA (K-DUR,KLOR-CON) 20 MEQ tablet Take 20 mEq by mouth daily.      . simvastatin (ZOCOR) 20 MG tablet Take 20 mg by mouth every evening.      . sucralfate (CARAFATE) 1 G tablet Take 1 g by mouth 5 (five) times daily.      . ONE TOUCH ULTRA TEST test strip         Blood pressure 94/54, pulse 84, temperature 98.7 F (37.1 C), temperature source Oral, resp. rate 16, height 5\' 7"  (1.702 m), weight 169 lb 1.5 oz (76.7 kg), SpO2 99.00%. Physical Exam: General: pleasant, WD/WN white male who is laying in bed in NAD HEENT: head is normocephalic, atraumatic.  Sclera are noninjected.  PERRL.  Ears and nose without any masses or lesions.  Mouth is pink and moist  Heart: regular, rate, and rhythm.  No obvious murmurs, gallops, or rubs noted.  Palpable pedal pulses bilaterally Lungs: CTAB, no wheezes, rhonchi, or rales noted.  Respiratory effort non-labored. Abd: Soft, distended with fluid wave, tympanic, mild tenderness in RUQ, +BS, large midline healed scar MS: All 4 extremities are symmetrical with no cyanosis, clubbing, or edema. Skin: warm and dry with no masses, scattered ecchymosis Psych: A&Ox3 with an appropriate affect.  Results for orders placed during the hospital encounter of 09/17/12 (from the past 48 hour(s))  PRO B NATRIURETIC PEPTIDE     Status: Abnormal   Collection Time    09/17/12  5:19 PM      Result Value Range   Pro B Natriuretic peptide (BNP) 11010.0 (*) 0 - 125 pg/mL  CBC     Status: Abnormal   Collection Time    09/17/12  5:49 PM      Result Value Range   WBC 8.2  4.0 - 10.5 K/uL   RBC  3.00 (*) 4.22 - 5.81 MIL/uL   Hemoglobin 8.3 (*) 13.0 - 17.0 g/dL   HCT 61.4 (*) 43.1 - 54.0 %   MCV 84.0  78.0 - 100.0 fL   MCH 27.7  26.0 - 34.0 pg   MCHC 32.9  30.0 - 36.0 g/dL   RDW 08.6 (*)  11.5 - 15.5 %   Platelets 310  150 - 400 K/uL  BASIC METABOLIC PANEL     Status: Abnormal   Collection Time    09/17/12  5:49 PM      Result Value Range   Sodium 134 (*) 135 - 145 mEq/L   Potassium 4.5  3.5 - 5.1 mEq/L   Chloride 97  96 - 112 mEq/L   CO2 27  19 - 32 mEq/L   Glucose, Bld 72  70 - 99 mg/dL   BUN 56 (*) 6 - 23 mg/dL   Creatinine, Ser 3.08 (*) 0.50 - 1.35 mg/dL   Calcium 8.7  8.4 - 65.7 mg/dL   GFR calc non Af Amer 29 (*) >90 mL/min   GFR calc Af Amer 34 (*) >90 mL/min   Comment:            The eGFR has been calculated     using the CKD EPI equation.     This calculation has not been     validated in all clinical     situations.     eGFR's persistently     <90 mL/min signify     possible Chronic Kidney Disease.  TROPONIN I     Status: None   Collection Time    09/17/12  6:00 PM      Result Value Range   Troponin I <0.30  <0.30 ng/mL   Comment:            Due to the release kinetics of cTnI,     a negative result within the first hours     of the onset of symptoms does not rule out     myocardial infarction with certainty.     If myocardial infarction is still suspected,     repeat the test at appropriate intervals.  CBC WITH DIFFERENTIAL     Status: Abnormal   Collection Time    09/17/12  6:22 PM      Result Value Range   WBC 7.5  4.0 - 10.5 K/uL   RBC 3.17 (*) 4.22 - 5.81 MIL/uL   Hemoglobin 8.9 (*) 13.0 - 17.0 g/dL   HCT 84.6 (*) 96.2 - 95.2 %   MCV 86.1  78.0 - 100.0 fL   MCH 28.1  26.0 - 34.0 pg   MCHC 32.6  30.0 - 36.0 g/dL   RDW 84.1 (*) 32.4 - 40.1 %   Platelets 300  150 - 400 K/uL   Neutrophils Relative 58  43 - 77 %   Neutro Abs 4.3  1.7 - 7.7 K/uL   Lymphocytes Relative 29  12 - 46 %   Lymphs Abs 2.1  0.7 - 4.0 K/uL   Monocytes Relative 12  3  - 12 %   Monocytes Absolute 0.9  0.1 - 1.0 K/uL   Eosinophils Relative 1  0 - 5 %   Eosinophils Absolute 0.1  0.0 - 0.7 K/uL   Basophils Relative 0  0 - 1 %   Basophils Absolute 0.0  0.0 - 0.1 K/uL  COMPREHENSIVE METABOLIC PANEL     Status: Abnormal   Collection Time    09/17/12  6:22 PM      Result Value Range   Sodium 135  135 - 145 mEq/L   Potassium 4.4  3.5 - 5.1 mEq/L   Chloride 97  96 - 112 mEq/L   CO2 30  19 - 32 mEq/L   Glucose, Bld 67 (*) 70 -  99 mg/dL   BUN 55 (*) 6 - 23 mg/dL   Creatinine, Ser 2.95 (*) 0.50 - 1.35 mg/dL   Calcium 8.8  8.4 - 28.4 mg/dL   Total Protein 6.0  6.0 - 8.3 g/dL   Albumin 2.3 (*) 3.5 - 5.2 g/dL   AST 20  0 - 37 U/L   ALT 9  0 - 53 U/L   Alkaline Phosphatase 113  39 - 117 U/L   Total Bilirubin 0.3  0.3 - 1.2 mg/dL   GFR calc non Af Amer 29 (*) >90 mL/min   GFR calc Af Amer 33 (*) >90 mL/min   Comment:            The eGFR has been calculated     using the CKD EPI equation.     This calculation has not been     validated in all clinical     situations.     eGFR's persistently     <90 mL/min signify     possible Chronic Kidney Disease.  PROTIME-INR     Status: None   Collection Time    09/17/12  6:22 PM      Result Value Range   Prothrombin Time 12.9  11.6 - 15.2 seconds   INR 0.98  0.00 - 1.49  APTT     Status: None   Collection Time    09/17/12  6:22 PM      Result Value Range   aPTT 36  24 - 37 seconds  D-DIMER, QUANTITATIVE     Status: Abnormal   Collection Time    09/17/12  6:22 PM      Result Value Range   D-Dimer, Quant 3.76 (*) 0.00 - 0.48 ug/mL-FEU   Comment:            AT THE INHOUSE ESTABLISHED CUTOFF     VALUE OF 0.48 ug/mL FEU,     THIS ASSAY HAS BEEN DOCUMENTED     IN THE LITERATURE TO HAVE     A SENSITIVITY AND NEGATIVE     PREDICTIVE VALUE OF AT LEAST     98 TO 99%.  THE TEST RESULT     SHOULD BE CORRELATED WITH     AN ASSESSMENT OF THE CLINICAL     PROBABILITY OF DVT / VTE.  POCT I-STAT TROPONIN I      Status: None   Collection Time    09/17/12  6:45 PM      Result Value Range   Troponin i, poc 0.04  0.00 - 0.08 ng/mL   Comment 3            Comment: Due to the release kinetics of cTnI,     a negative result within the first hours     of the onset of symptoms does not rule out     myocardial infarction with certainty.     If myocardial infarction is still suspected,     repeat the test at appropriate intervals.  URINALYSIS, ROUTINE W REFLEX MICROSCOPIC     Status: None   Collection Time    09/17/12  6:58 PM      Result Value Range   Color, Urine YELLOW  YELLOW   APPearance CLEAR  CLEAR   Specific Gravity, Urine 1.012  1.005 - 1.030   pH 6.0  5.0 - 8.0   Glucose, UA NEGATIVE  NEGATIVE mg/dL   Hgb urine dipstick NEGATIVE  NEGATIVE   Bilirubin Urine NEGATIVE  NEGATIVE  Ketones, ur NEGATIVE  NEGATIVE mg/dL   Protein, ur NEGATIVE  NEGATIVE mg/dL   Urobilinogen, UA 1.0  0.0 - 1.0 mg/dL   Nitrite NEGATIVE  NEGATIVE   Leukocytes, UA NEGATIVE  NEGATIVE   Comment: MICROSCOPIC NOT DONE ON URINES WITH NEGATIVE PROTEIN, BLOOD, LEUKOCYTES, NITRITE, OR GLUCOSE <1000 mg/dL.  GLUCOSE, CAPILLARY     Status: Abnormal   Collection Time    09/17/12  8:13 PM      Result Value Range   Glucose-Capillary 65 (*) 70 - 99 mg/dL  GLUCOSE, CAPILLARY     Status: Abnormal   Collection Time    09/17/12  9:19 PM      Result Value Range   Glucose-Capillary 129 (*) 70 - 99 mg/dL  HEMOGLOBIN Z6X     Status: Abnormal   Collection Time    09/17/12 11:40 PM      Result Value Range   Hemoglobin A1C 6.3 (*) <5.7 %   Comment: (NOTE)                                                                               According to the ADA Clinical Practice Recommendations for 2011, when     HbA1c is used as a screening test:      >=6.5%   Diagnostic of Diabetes Mellitus               (if abnormal result is confirmed)     5.7-6.4%   Increased risk of developing Diabetes Mellitus     References:Diagnosis and  Classification of Diabetes Mellitus,Diabetes     Care,2011,34(Suppl 1):S62-S69 and Standards of Medical Care in             Diabetes - 2011,Diabetes Care,2011,34 (Suppl 1):S11-S61.   Mean Plasma Glucose 134 (*) <117 mg/dL  GLUCOSE, CAPILLARY     Status: Abnormal   Collection Time    09/18/12  1:08 AM      Result Value Range   Glucose-Capillary 145 (*) 70 - 99 mg/dL  COMPREHENSIVE METABOLIC PANEL     Status: Abnormal   Collection Time    09/18/12  2:20 AM      Result Value Range   Sodium 133 (*) 135 - 145 mEq/L   Potassium 3.8  3.5 - 5.1 mEq/L   Chloride 94 (*) 96 - 112 mEq/L   CO2 30  19 - 32 mEq/L   Glucose, Bld 125 (*) 70 - 99 mg/dL   BUN 51 (*) 6 - 23 mg/dL   Creatinine, Ser 0.96 (*) 0.50 - 1.35 mg/dL   Calcium 8.6  8.4 - 04.5 mg/dL   Total Protein 6.0  6.0 - 8.3 g/dL   Albumin 2.3 (*) 3.5 - 5.2 g/dL   AST 18  0 - 37 U/L   ALT 8  0 - 53 U/L   Alkaline Phosphatase 109  39 - 117 U/L   Total Bilirubin 0.3  0.3 - 1.2 mg/dL   GFR calc non Af Amer 32 (*) >90 mL/min   GFR calc Af Amer 37 (*) >90 mL/min   Comment:            The eGFR has been calculated  using the CKD EPI equation.     This calculation has not been     validated in all clinical     situations.     eGFR's persistently     <90 mL/min signify     possible Chronic Kidney Disease.  TROPONIN I     Status: None   Collection Time    09/18/12  2:20 AM      Result Value Range   Troponin I <0.30  <0.30 ng/mL   Comment:            Due to the release kinetics of cTnI,     a negative result within the first hours     of the onset of symptoms does not rule out     myocardial infarction with certainty.     If myocardial infarction is still suspected,     repeat the test at appropriate intervals.  CBC     Status: Abnormal   Collection Time    09/18/12  2:20 AM      Result Value Range   WBC 7.4  4.0 - 10.5 K/uL   RBC 3.29 (*) 4.22 - 5.81 MIL/uL   Hemoglobin 9.1 (*) 13.0 - 17.0 g/dL   HCT 65.7 (*) 84.6 - 96.2 %    MCV 86.0  78.0 - 100.0 fL   MCH 27.7  26.0 - 34.0 pg   MCHC 32.2  30.0 - 36.0 g/dL   RDW 95.2 (*) 84.1 - 32.4 %   Platelets 270  150 - 400 K/uL  MAGNESIUM     Status: None   Collection Time    09/18/12  2:20 AM      Result Value Range   Magnesium 2.5  1.5 - 2.5 mg/dL  PROTIME-INR     Status: None   Collection Time    09/18/12  2:20 AM      Result Value Range   Prothrombin Time 12.8  11.6 - 15.2 seconds   INR 0.97  0.00 - 1.49  TROPONIN I     Status: None   Collection Time    09/18/12  4:10 AM      Result Value Range   Troponin I <0.30  <0.30 ng/mL   Comment:            Due to the release kinetics of cTnI,     a negative result within the first hours     of the onset of symptoms does not rule out     myocardial infarction with certainty.     If myocardial infarction is still suspected,     repeat the test at appropriate intervals.  GLUCOSE, CAPILLARY     Status: None   Collection Time    09/18/12  6:07 AM      Result Value Range   Glucose-Capillary 94  70 - 99 mg/dL  GLUCOSE, CAPILLARY     Status: Abnormal   Collection Time    09/18/12  9:34 AM      Result Value Range   Glucose-Capillary 105 (*) 70 - 99 mg/dL   Comment 1 Notify RN    GLUCOSE, CAPILLARY     Status: Abnormal   Collection Time    09/18/12 11:13 AM      Result Value Range   Glucose-Capillary 117 (*) 70 - 99 mg/dL  TROPONIN I     Status: None   Collection Time    09/18/12 12:18 PM      Result Value  Range   Troponin I <0.30  <0.30 ng/mL   Comment:            Due to the release kinetics of cTnI,     a negative result within the first hours     of the onset of symptoms does not rule out     myocardial infarction with certainty.     If myocardial infarction is still suspected,     repeat the test at appropriate intervals.   Dg Chest 2 View  09/17/2012  *RADIOLOGY REPORT*  Clinical Data: Shortness of breath.  CHEST - 2 VIEW  Comparison: 09/01/2012.  Findings: A poor inspiration is again demonstrated.   The heart remains grossly normal in size with post CABG changes.  Interval mild patchy opacity at the left lung base and increased patchy opacity in the right lung.  Stable right pleural fluid.  Diffuse osteopenia and right shoulder postoperative changes.  IMPRESSION:  1.  Interval small amount of patchy atelectasis or pneumonia at the left lung base. 2.  Progressive patchy opacity in the right lung, suspicious for pneumonia. 3.  Stable right pleural fluid.   Original Report Authenticated By: Beckie Salts, M.D.    Ct Chest Wo Contrast  09/17/2012  *RADIOLOGY REPORT*  Clinical Data: Shortness of breath.  Right lung density and elevated D-dimer.  CT CHEST WITHOUT CONTRAST  Technique:  Multidetector CT imaging of the chest was performed following the standard protocol without IV contrast.  Comparison: None.  Findings: Postoperative changes in the mediastinum.  Coronary artery calcification and stents.  The normal heart size.  Normal caliber thoracic aorta with calcification.  No abnormal mediastinal fluid collections.  Mediastinal lymph nodes are not pathologically enlarged.  The esophagus is decompressed.  There is a right pleural effusion with consolidation in the right lung base.  Air bronchograms are present.  The appearance is nonspecific but compatible with pneumonia or contusion.  Airways appear patent.  No pneumothorax.  Scattered fibrosis in the lungs. No focal consolidation on the left.  Included portions of the upper abdominal organs demonstrate changes of hepatic cirrhosis with upper abdominal ascites.  Cholelithiasis. Surgical clips in the left upper quadrant.  Vascular calcifications.  Changes of osteoporosis with multiple thoracic vertebral compression fractures, some of which have been treated with kyphoplasty.  Degenerative changes in the thoracic spine.  Old bilateral rib fractures.  Acute appearing fractures of the posterolateral right eighth, ninth, tenth, and eleventh ribs.  IMPRESSION: Right  pleural effusion with basilar consolidation.  Multiple acute right lower rib fractures.  Old bilateral rib fractures and osteoporotic fractures of the spine.  No pneumothorax.   Original Report Authenticated By: Burman Nieves, M.D.    US Abdomen Complete  09/18/2012  *RADIOLOGY REPORT*  Clinical Data:  Abdominal pain  COMPLETE ABDOMINAL ULTRASOUND  Comparison:  September 06, 2012  Findings:  Mild ascites is noted.  Gallbladder:  Multiple gallstones are noted.  Gallbladder wall thickening is noted, but this may be due to adjacent ascites or hepatocellular disease.  Common bile duct:  Measures 4.2 mm which is within normal limits.  Liver:  Nodular margins are noted with increased echogenicity consistent with history of hepatic cirrhosis. No focal mass is noted, although correlation with the alpha-fetoprotein level is recommended given the presence of cirrhosis.  IVC:  Not visualized due to overlying bowel gas.  Pancreas:  Not visualized due to overlying bowel gas.  Spleen:  Measures 6.7 cm which is within normal limits.  Right Kidney:  Measures 10.1 cm which is within normal limits.  Left Kidney:  Measures 10.2 cm which is within normal limits.  Abdominal aorta:  No aneurysm identified.  IMPRESSION: Mild ascites.  Cholelithiasis with gallbladder wall thickening; this most likely is due to ascites or adjacent hepatocellular disease.  Hepatic cirrhosis is noted and correlation with alpha- fetoprotein level is recommended.   Original Report Authenticated By: Lupita Raider.,  M.D.    Nm Pulmonary Perf And Vent  09/18/2012  *RADIOLOGY REPORT*  Clinical Data:  Shortness of breath.  NUCLEAR MEDICINE VENTILATION - PERFUSION LUNG SCAN  Technique:  Wash-in, equilibrium, and wash-out phase ventilation images were obtained using Tc-DTPA.  Perfusion images were obtained in multiple projections after intravenous injection of Tc-78m MAA.  Radiopharmaceuticals:  40 mCi Tc-DTPA and 6.0 mCi Tc-47m MAA.  Comparison:  Chest  radiograph 09/17/2012.  Findings: There is heterogeneous uptake on perfusion images without discrete wedge-shaped defect.  Photopenia at the right lung base corresponds to a pleural effusion on yesterday's chest radiograph.  Ventilation images show patchiness bilaterally.  IMPRESSION: Low-intermediate probability for pulmonary embolus.   Original Report Authenticated By: Leanna Battles, M.D.       Assessment/Plan Incidental Cholelithiasis and GB wall thickening in setting of cirrhosis for NASH:  Question biliary colic vs pain from hepatitis, vs ascites cause for pain 1.  Very much doubt that this is bilary colic as the pain is not related to PO intake.  Gallbladders do not appear normal in cirrhotic patients.  Can not have HIDA scan until Wednesday morning secondary to VQ scan which was done earlier this admission 2.  Advance to low fat diet as tolerated then NPO after midnight on Tuesday night in prep for HIDA on Wednesday 3.  Pain meds, antiemetics, on antibiotics for suspected PNA 4.  If HIDA scan is negative, there will be no need for cholecystectomy 5.  The patient is a bad surgical candidate and himself is hesitant about surgery 6.  If he has persistent pain would recommend GI be reconsulted  Cirrhosis for NASH with ascites CAD s/p CABG Systolic CHF CKD - creatinine 1.61 Bleeding GAVE Hyponatremia Anemia of chronic disease  DORT, Breaker Springer 09/18/2012, 3:36 PM Pager: [phone]

## 2012-09-18 NOTE — Consult Note (Signed)
Patient without story for biliary colic.  Incidental gallstones.  Burning RUQ pain could be from cirrhosis or gastric issue(GAVE).   Poor operative candidate given cirrhosis and other health issues.  Okay to proceed with HIDA scan.  This will have to be due to delayed due to recent VQ scan.  If that is normal, followup PRN.  If positive, will need medical clearance before considering cholecystectomy.I would be inclined to get second opinion from gastroenterology first as well if patient has persistent symptoms - no need for now from my standpoint.

## 2012-09-19 DIAGNOSIS — K8020 Calculus of gallbladder without cholecystitis without obstruction: Secondary | ICD-10-CM

## 2012-09-19 DIAGNOSIS — IMO0001 Reserved for inherently not codable concepts without codable children: Secondary | ICD-10-CM

## 2012-09-19 DIAGNOSIS — I509 Heart failure, unspecified: Secondary | ICD-10-CM

## 2012-09-19 DIAGNOSIS — K7460 Unspecified cirrhosis of liver: Secondary | ICD-10-CM

## 2012-09-19 HISTORY — DX: Calculus of gallbladder without cholecystitis without obstruction: K80.20

## 2012-09-19 LAB — GLUCOSE, CAPILLARY
Glucose-Capillary: 133 mg/dL — ABNORMAL HIGH (ref 70–99)
Glucose-Capillary: 166 mg/dL — ABNORMAL HIGH (ref 70–99)

## 2012-09-19 NOTE — Progress Notes (Signed)
Patient admitted this morning, has thickening in gall bladder and gall stones . Will get surgery consult.

## 2012-09-19 NOTE — Progress Notes (Signed)
Patient ID: [name], male   DOB: Apr 12, 1946, 67 y.o.   MRN: [id number]    Subjective: Pt reports no pain now, denies n/v, feels hungry  Objective: Vital signs in last 24 hours: Temp:  [98.4 F (36.9 C)-98.7 F (37.1 C)] 98.4 F (36.9 C) (04/01 0549) Pulse Rate:  [81-86] 86 (04/01 0549) Resp:  [16-20] 20 (04/01 0549) BP: (90-115)/(54-68) 115/68 mmHg (04/01 0549) SpO2:  [98 %-99 %] 98 % (04/01 0549) Weight:  [171 lb (77.565 kg)] 171 lb (77.565 kg) (04/01 0549) Last BM Date: 09/18/12  Intake/Output from previous day: 03/31 0701 - 04/01 0700 In: 360 [P.O.:360] Out: 950 [Urine:950] Intake/Output this shift:    PE: Abd: soft, nontender, +BS General: awake, alert NAD  Lab Results:   Recent Labs  09/17/12 1822 09/18/12 0220  WBC 7.5 7.4  HGB 8.9* 9.1*  HCT 27.3* 28.3*  PLT 300 270   BMET  Recent Labs  09/17/12 1822 09/18/12 0220  NA 135 133*  K 4.4 3.8  CL 97 94*  CO2 30 30  GLUCOSE 67* 125*  BUN 55* 51*  CREATININE 2.26* 2.05*  CALCIUM 8.8 8.6   PT/INR  Recent Labs  09/17/12 1822 09/18/12 0220  LABPROT 12.9 12.8  INR 0.98 0.97   CMP     Component Value Date/Time   NA 133* 09/18/2012 0220   K 3.8 09/18/2012 0220   CL 94* 09/18/2012 0220   CO2 30 09/18/2012 0220   GLUCOSE 125* 09/18/2012 0220   BUN 51* 09/18/2012 0220   CREATININE 2.05* 09/18/2012 0220   CALCIUM 8.6 09/18/2012 0220   PROT 6.0 09/18/2012 0220   ALBUMIN 2.3* 09/18/2012 0220   AST 18 09/18/2012 0220   ALT 8 09/18/2012 0220   ALKPHOS 109 09/18/2012 0220   BILITOT 0.3 09/18/2012 0220   GFRNONAA 32* 09/18/2012 0220   GFRAA 37* 09/18/2012 0220   Lipase  No results found for this basename: lipase       Studies/Results: Dg Chest 2 View  09/17/2012  *RADIOLOGY REPORT*  Clinical Data: Shortness of breath.  CHEST - 2 VIEW  Comparison: 09/01/2012.  Findings: A poor inspiration is again demonstrated.  The heart remains grossly normal in size with post CABG changes.  Interval mild patchy  opacity at the left lung base and increased patchy opacity in the right lung.  Stable right pleural fluid.  Diffuse osteopenia and right shoulder postoperative changes.  IMPRESSION:  1.  Interval small amount of patchy atelectasis or pneumonia at the left lung base. 2.  Progressive patchy opacity in the right lung, suspicious for pneumonia. 3.  Stable right pleural fluid.   Original Report Authenticated By: Beckie Salts, M.D.    Ct Chest Wo Contrast  09/17/2012  *RADIOLOGY REPORT*  Clinical Data: Shortness of breath.  Right lung density and elevated D-dimer.  CT CHEST WITHOUT CONTRAST  Technique:  Multidetector CT imaging of the chest was performed following the standard protocol without IV contrast.  Comparison: None.  Findings: Postoperative changes in the mediastinum.  Coronary artery calcification and stents.  The normal heart size.  Normal caliber thoracic aorta with calcification.  No abnormal mediastinal fluid collections.  Mediastinal lymph nodes are not pathologically enlarged.  The esophagus is decompressed.  There is a right pleural effusion with consolidation in the right lung base.  Air bronchograms are present.  The appearance is nonspecific but compatible with pneumonia or contusion.  Airways appear patent.  No pneumothorax.  Scattered fibrosis in the lungs. No  focal consolidation on the left.  Included portions of the upper abdominal organs demonstrate changes of hepatic cirrhosis with upper abdominal ascites.  Cholelithiasis. Surgical clips in the left upper quadrant.  Vascular calcifications.  Changes of osteoporosis with multiple thoracic vertebral compression fractures, some of which have been treated with kyphoplasty.  Degenerative changes in the thoracic spine.  Old bilateral rib fractures.  Acute appearing fractures of the posterolateral right eighth, ninth, tenth, and eleventh ribs.  IMPRESSION: Right pleural effusion with basilar consolidation.  Multiple acute right lower rib fractures.   Old bilateral rib fractures and osteoporotic fractures of the spine.  No pneumothorax.   Original Report Authenticated By: Burman Nieves, M.D.    US Abdomen Complete  09/18/2012  *RADIOLOGY REPORT*  Clinical Data:  Abdominal pain  COMPLETE ABDOMINAL ULTRASOUND  Comparison:  September 06, 2012  Findings:  Mild ascites is noted.  Gallbladder:  Multiple gallstones are noted.  Gallbladder wall thickening is noted, but this may be due to adjacent ascites or hepatocellular disease.  Common bile duct:  Measures 4.2 mm which is within normal limits.  Liver:  Nodular margins are noted with increased echogenicity consistent with history of hepatic cirrhosis. No focal mass is noted, although correlation with the alpha-fetoprotein level is recommended given the presence of cirrhosis.  IVC:  Not visualized due to overlying bowel gas.  Pancreas:  Not visualized due to overlying bowel gas.  Spleen:  Measures 6.7 cm which is within normal limits.  Right Kidney:  Measures 10.1 cm which is within normal limits.  Left Kidney:  Measures 10.2 cm which is within normal limits.  Abdominal aorta:  No aneurysm identified.  IMPRESSION: Mild ascites.  Cholelithiasis with gallbladder wall thickening; this most likely is due to ascites or adjacent hepatocellular disease.  Hepatic cirrhosis is noted and correlation with alpha- fetoprotein level is recommended.   Original Report Authenticated By: Lupita Raider.,  M.D.    Nm Pulmonary Perf And Vent  09/18/2012  *RADIOLOGY REPORT*  Clinical Data:  Shortness of breath.  NUCLEAR MEDICINE VENTILATION - PERFUSION LUNG SCAN  Technique:  Wash-in, equilibrium, and wash-out phase ventilation images were obtained using Tc-DTPA.  Perfusion images were obtained in multiple projections after intravenous injection of Tc-65m MAA.  Radiopharmaceuticals:  40 mCi Tc-DTPA and 6.0 mCi Tc-103m MAA.  Comparison:  Chest radiograph 09/17/2012.  Findings: There is heterogeneous uptake on perfusion images without  discrete wedge-shaped defect.  Photopenia at the right lung base corresponds to a pleural effusion on yesterday's chest radiograph.  Ventilation images show patchiness bilaterally.  IMPRESSION: Low-intermediate probability for pulmonary embolus.   Original Report Authenticated By: Leanna Battles, M.D.     Anti-infectives: Anti-infectives   Start     Dose/Rate Route Frequency Ordered Stop   09/17/12 2330  vancomycin (VANCOCIN) IVPB 1000 mg/200 mL premix     1,000 mg 200 mL/hr over 60 Minutes Intravenous Every 24 hours 09/17/12 2315     09/17/12 2330  piperacillin-tazobactam (ZOSYN) IVPB 3.375 g     3.375 g 12.5 mL/hr over 240 Minutes Intravenous Every 8 hours 09/17/12 2315         Assessment/Plan 1. Incidental Gallstones: the patient is asymptomatic now, no pain/bilary colic symptoms, for HIDA scan tomorrow but unless this is positive plus patient has symptoms would not need surgical intervention, will follow up tomorrow after HIDA scan   LOS: 2 days    Erandy Mceachern 09/19/2012

## 2012-09-19 NOTE — Progress Notes (Signed)
[facility] PROGRESS NOTE  [name] QMV:[id number] DOB: January 31, 1946 DOA: 09/17/2012 PCP: Dina Rich, MD  Assessment/Plan:  Dyspnea- secondary to CHF exacerbation, Chronic systolic CHF, NYHA class III symptoms. EF 37% with wall motion abnormalities on cardiac MRI (echo was difficult study). Continue lasix 80 mg po BID. Her has improved.  Gall stones/GB thickening- got surgical evaluation, surgery does not feel it is cholecystitis. Will obtain HIDA scan in am.  Liver cirrhosis- s/p paracentesis, no need of paracentesis at this time due to mild ascites  Anemia- / sec to GAVE, continue PPI  CKD- Cr worsening due to diuresis, continue to monitor the labs   Diabetes mellitus Continue lantus and SSI Blood glucose is stable  Hypertension Will hold the hydralazine as patient is now hypotensive     Code Status: full   Family Communication:  Discussed with patient Disposition Plan: Home when stable   Consultants:  Surgery   Procedures:  none  Antibiotics:  None  HPI/Subjective: Patient seen and examined, admitted with shortness of breath. Breathing has improved. Surgery started on low fat diet, HIDA scan in am. Patient denies pain at this time  Objective: Filed Vitals:   09/19/12 0549 09/19/12 1045 09/19/12 1404 09/19/12 1443  BP: 115/68 108/45 99/54 98/54   Pulse: 86 41 85   Temp: 98.4 F (36.9 C) 98.8 F (37.1 C) 98.6 F (37 C)   TempSrc: Oral Oral Oral   Resp: 20 18    Height:      Weight: 77.565 kg (171 lb)     SpO2: 98% 98% 97%     Intake/Output Summary (Last 24 hours) at 09/19/12 1445 Last data filed at 09/19/12 1400  Gross per 24 hour  Intake    360 ml  Output   1400 ml  Net  -1040 ml   Filed Weights   09/18/12 0100 09/19/12 0549  Weight: 76.7 kg (169 lb 1.5 oz) 77.565 kg (171 lb)    Exam:   General:  Appear in no acute distress  Cardiovascular: s1s2 RRR  Respiratory: clear bilaterally  Abdomen: Soft,  nontender  Musculoskeletal: No edema  Data Reviewed: Basic Metabolic Panel:  Recent Labs Lab 09/17/12 1749 09/17/12 1822 09/18/12 0220  NA 134* 135 133*  K 4.5 4.4 3.8  CL 97 97 94*  CO2 27 30 30   GLUCOSE 72 67* 125*  BUN 56* 55* 51*  CREATININE 2.22* 2.26* 2.05*  CALCIUM 8.7 8.8 8.6  MG  --   --  2.5   Liver Function Tests:  Recent Labs Lab 09/17/12 1822 09/18/12 0220  AST 20 18  ALT 9 8  ALKPHOS 113 109  BILITOT 0.3 0.3  PROT 6.0 6.0  ALBUMIN 2.3* 2.3*   No results found for this basename: LIPASE, AMYLASE,  in the last 168 hours No results found for this basename: AMMONIA,  in the last 168 hours CBC:  Recent Labs Lab 09/17/12 1749 09/17/12 1822 09/18/12 0220  WBC 8.2 7.5 7.4  NEUTROABS  --  4.3  --   HGB 8.3* 8.9* 9.1*  HCT 25.2* 27.3* 28.3*  MCV 84.0 86.1 86.0  PLT 310 300 270   Cardiac Enzymes:  Recent Labs Lab 09/17/12 1800 09/18/12 0220 09/18/12 0410 09/18/12 1218  TROPONINI <0.30 <0.30 <0.30 <0.30   BNP (last 3 results)  Recent Labs  09/01/12 0430 09/17/12 1719  PROBNP 10584.0* 11010.0*   CBG:  Recent Labs Lab 09/18/12 1113 09/18/12 1626 09/18/12 2107 09/19/12 0612 09/19/12 1132  GLUCAP 117* 174* 197*  133* 166*    No results found for this or any previous visit (from the past 240 hour(s)).   Studies: Dg Chest 2 View  09/17/2012  *RADIOLOGY REPORT*  Clinical Data: Shortness of breath.  CHEST - 2 VIEW  Comparison: 09/01/2012.  Findings: A poor inspiration is again demonstrated.  The heart remains grossly normal in size with post CABG changes.  Interval mild patchy opacity at the left lung base and increased patchy opacity in the right lung.  Stable right pleural fluid.  Diffuse osteopenia and right shoulder postoperative changes.  IMPRESSION:  1.  Interval small amount of patchy atelectasis or pneumonia at the left lung base. 2.  Progressive patchy opacity in the right lung, suspicious for pneumonia. 3.  Stable right pleural  fluid.   Original Report Authenticated By: Beckie Salts, M.D.    Ct Chest Wo Contrast  09/17/2012  *RADIOLOGY REPORT*  Clinical Data: Shortness of breath.  Right lung density and elevated D-dimer.  CT CHEST WITHOUT CONTRAST  Technique:  Multidetector CT imaging of the chest was performed following the standard protocol without IV contrast.  Comparison: None.  Findings: Postoperative changes in the mediastinum.  Coronary artery calcification and stents.  The normal heart size.  Normal caliber thoracic aorta with calcification.  No abnormal mediastinal fluid collections.  Mediastinal lymph nodes are not pathologically enlarged.  The esophagus is decompressed.  There is a right pleural effusion with consolidation in the right lung base.  Air bronchograms are present.  The appearance is nonspecific but compatible with pneumonia or contusion.  Airways appear patent.  No pneumothorax.  Scattered fibrosis in the lungs. No focal consolidation on the left.  Included portions of the upper abdominal organs demonstrate changes of hepatic cirrhosis with upper abdominal ascites.  Cholelithiasis. Surgical clips in the left upper quadrant.  Vascular calcifications.  Changes of osteoporosis with multiple thoracic vertebral compression fractures, some of which have been treated with kyphoplasty.  Degenerative changes in the thoracic spine.  Old bilateral rib fractures.  Acute appearing fractures of the posterolateral right eighth, ninth, tenth, and eleventh ribs.  IMPRESSION: Right pleural effusion with basilar consolidation.  Multiple acute right lower rib fractures.  Old bilateral rib fractures and osteoporotic fractures of the spine.  No pneumothorax.   Original Report Authenticated By: Burman Nieves, M.D.    US Abdomen Complete  09/18/2012  *RADIOLOGY REPORT*  Clinical Data:  Abdominal pain  COMPLETE ABDOMINAL ULTRASOUND  Comparison:  September 06, 2012  Findings:  Mild ascites is noted.  Gallbladder:  Multiple gallstones are  noted.  Gallbladder wall thickening is noted, but this may be due to adjacent ascites or hepatocellular disease.  Common bile duct:  Measures 4.2 mm which is within normal limits.  Liver:  Nodular margins are noted with increased echogenicity consistent with history of hepatic cirrhosis. No focal mass is noted, although correlation with the alpha-fetoprotein level is recommended given the presence of cirrhosis.  IVC:  Not visualized due to overlying bowel gas.  Pancreas:  Not visualized due to overlying bowel gas.  Spleen:  Measures 6.7 cm which is within normal limits.  Right Kidney:  Measures 10.1 cm which is within normal limits.  Left Kidney:  Measures 10.2 cm which is within normal limits.  Abdominal aorta:  No aneurysm identified.  IMPRESSION: Mild ascites.  Cholelithiasis with gallbladder wall thickening; this most likely is due to ascites or adjacent hepatocellular disease.  Hepatic cirrhosis is noted and correlation with alpha- fetoprotein level is recommended.  Original Report Authenticated By: Lupita Raider.,  M.D.    Nm Pulmonary Perf And Vent  09/18/2012  *RADIOLOGY REPORT*  Clinical Data:  Shortness of breath.  NUCLEAR MEDICINE VENTILATION - PERFUSION LUNG SCAN  Technique:  Wash-in, equilibrium, and wash-out phase ventilation images were obtained using Tc-DTPA.  Perfusion images were obtained in multiple projections after intravenous injection of Tc-59m MAA.  Radiopharmaceuticals:  40 mCi Tc-DTPA and 6.0 mCi Tc-29m MAA.  Comparison:  Chest radiograph 09/17/2012.  Findings: There is heterogeneous uptake on perfusion images without discrete wedge-shaped defect.  Photopenia at the right lung base corresponds to a pleural effusion on yesterday's chest radiograph.  Ventilation images show patchiness bilaterally.  IMPRESSION: Low-intermediate probability for pulmonary embolus.   Original Report Authenticated By: Leanna Battles, M.D.     Scheduled Meds: . aspirin EC  81 mg Oral Daily  . carvedilol   6.25 mg Oral BID WC  . enoxaparin (LOVENOX) injection  40 mg Subcutaneous Q24H  . ferrous sulfate  325 mg Oral Daily  . furosemide  80 mg Oral BID  . hydrALAZINE  25 mg Oral Q8H  . insulin aspart  0-9 Units Subcutaneous TID WC  . insulin glargine  18 Units Subcutaneous q morning - 10a  . isosorbide mononitrate  30 mg Oral Daily  . pantoprazole  40 mg Oral BID  . piperacillin-tazobactam (ZOSYN)  IV  3.375 g Intravenous Q8H  . potassium chloride SA  20 mEq Oral Daily  . ranolazine  1,000 mg Oral BID  . simvastatin  20 mg Oral QPM  . sodium chloride  3 mL Intravenous Q12H  . sucralfate  1 g Oral 5 X Daily  . vancomycin  1,000 mg Intravenous Q24H   Continuous Infusions:   Principal Problem:   Shortness of breath Active Problems:   Anemia   Renal failure (ARF), acute on chronic   Diabetes type 2, uncontrolled   CAD (coronary artery disease), S/P CABG & stents   GAVE (gastric antral vascular ectasia)   Cirrhosis   Chronic systolic CHF (congestive heart failure)    Time spent: 30    [facility]  [facility] Pager [phone] 7PM-7AM, please contact night-coverage at [email or web address], password [facility] 09/19/2012, 2:45 PM  LOS: 2 days

## 2012-09-19 NOTE — Progress Notes (Signed)
Incidental gallstones without symptoms Low fat fat OK from our standpoint HIDA scan tomorrow to reassure no GB problems beyond expected fair function in a cirrhotic

## 2012-09-20 ENCOUNTER — Encounter: Payer: Medicare Other | Admitting: Cardiology

## 2012-09-20 ENCOUNTER — Inpatient Hospital Stay (HOSPITAL_COMMUNITY): Payer: PRIVATE HEALTH INSURANCE

## 2012-09-20 DIAGNOSIS — E876 Hypokalemia: Secondary | ICD-10-CM | POA: Diagnosis not present

## 2012-09-20 DIAGNOSIS — R109 Unspecified abdominal pain: Secondary | ICD-10-CM

## 2012-09-20 LAB — CBC
HCT: 25.3 % — ABNORMAL LOW (ref 39.0–52.0)
Hemoglobin: 8.2 g/dL — ABNORMAL LOW (ref 13.0–17.0)
MCHC: 32.4 g/dL (ref 30.0–36.0)
MCV: 86.1 fL (ref 78.0–100.0)
RDW: 15.6 % — ABNORMAL HIGH (ref 11.5–15.5)
WBC: 7.6 10*3/uL (ref 4.0–10.5)

## 2012-09-20 LAB — GLUCOSE, CAPILLARY
Glucose-Capillary: 174 mg/dL — ABNORMAL HIGH (ref 70–99)
Glucose-Capillary: 102 mg/dL — ABNORMAL HIGH (ref 70–99)
Glucose-Capillary: 125 mg/dL — ABNORMAL HIGH (ref 70–99)

## 2012-09-20 LAB — COMPREHENSIVE METABOLIC PANEL
ALT: 8 U/L (ref 0–53)
Albumin: 2 g/dL — ABNORMAL LOW (ref 3.5–5.2)
Alkaline Phosphatase: 94 U/L (ref 39–117)
Calcium: 8.4 mg/dL (ref 8.4–10.5)
GFR calc Af Amer: 39 mL/min — ABNORMAL LOW (ref >90)
Glucose, Bld: 78 mg/dL (ref 70–99)
Potassium: 3.2 mEq/L — ABNORMAL LOW (ref 3.5–5.1)
Sodium: 138 mEq/L (ref 135–145)
Total Protein: 5.5 g/dL — ABNORMAL LOW (ref 6.0–8.3)

## 2012-09-20 MED ORDER — DOCUSATE SODIUM 100 MG PO CAPS
200.0000 mg | ORAL_CAPSULE | Freq: Every day | ORAL | Status: DC
  Administered 2012-09-20: 200 mg via ORAL
  Filled 2012-09-20 (×2): qty 2

## 2012-09-20 MED ORDER — TECHNETIUM TC 99M MEBROFENIN IV KIT: 5.0000 | PACK | Freq: Once | INTRAVENOUS | Status: AC | PRN

## 2012-09-20 MED ORDER — INSULIN GLARGINE 100 UNIT/ML ~~LOC~~ SOLN
5.0000 [IU] | Freq: Every morning | SUBCUTANEOUS | Status: DC
  Administered 2012-09-21: 5 [IU] via SUBCUTANEOUS
  Filled 2012-09-20: qty 0.05

## 2012-09-20 MED ORDER — POTASSIUM CHLORIDE CRYS ER 20 MEQ PO TBCR
40.0000 meq | EXTENDED_RELEASE_TABLET | Freq: Two times a day (BID) | ORAL | Status: DC
  Administered 2012-09-20 – 2012-09-21 (×2): 40 meq via ORAL
  Filled 2012-09-20 (×3): qty 2

## 2012-09-20 NOTE — Progress Notes (Addendum)
Patient ID: [name], male   DOB: 1946-06-20, 68 y.o.   MRN: [id number] Awaiting HIDA scan today, will follow up with recs after results available.  WHITE, ELIZABETH 9:07 AM 09/20/2012

## 2012-09-20 NOTE — Progress Notes (Addendum)
Subjective: The patient recently came back from HIDA scan. He complains of hunger. He has continued right upper quadrant discomfort, but he says it is less. He has no nausea vomiting. He denies shortness of breath at this time.  Objective: Vital signs in last 24 hours: Filed Vitals:   09/20/12 0853 09/20/12 1330 09/20/12 1500 09/20/12 1700  BP: 104/65 101/62 110/67 111/74  Pulse: 77 74 73 73  Temp: 98.8 F (37.1 C) 98.5 F (36.9 C)    TempSrc: Oral Oral    Resp:      Height:      Weight:      SpO2: 97% 97% 98%     Intake/Output Summary (Last 24 hours) at 09/20/12 1858 Last data filed at 09/20/12 1630  Gross per 24 hour  Intake    360 ml  Output   1000 ml  Net   -640 ml    Weight change: 0.635 kg (1 lb 6.4 oz)  Physical exam: General: Debilitated-appearing 67 year old Caucasian man laying in bed, in no acute distress. Lungs: Clear to auscultation bilaterally. Heart: S1, S2, with a soft systolic murmur. Abdomen: Positive bowel sounds, soft, mildly tender in the epigastrium/right upper quadrant. No masses palpated. No distention. Extremities: No pedal edema. Neurologic: He is alert and oriented x3.  Lab Results: Basic Metabolic Panel:  Recent Labs  78/29/56 0220 09/20/12 1410  NA 133* 138  K 3.8 3.2*  CL 94* 101  CO2 30 29  GLUCOSE 125* 78  BUN 51* 39*  CREATININE 2.05* 1.97*  CALCIUM 8.6 8.4  MG 2.5  --    Liver Function Tests:  Recent Labs  09/18/12 0220 09/20/12 1410  AST 18 19  ALT 8 8  ALKPHOS 109 94  BILITOT 0.3 0.3  PROT 6.0 5.5*  ALBUMIN 2.3* 2.0*   No results found for this basename: LIPASE, AMYLASE,  in the last 72 hours No results found for this basename: AMMONIA,  in the last 72 hours CBC:  Recent Labs  09/18/12 0220 09/20/12 1410  WBC 7.4 7.6  HGB 9.1* 8.2*  HCT 28.3* 25.3*  MCV 86.0 86.1  PLT 270 267   Cardiac Enzymes:  Recent Labs  09/18/12 0220 09/18/12 0410 09/18/12 1218  TROPONINI <0.30 <0.30 <0.30   BNP: No  results found for this basename: PROBNP,  in the last 72 hours D-Dimer: No results found for this basename: DDIMER,  in the last 72 hours CBG:  Recent Labs  09/19/12 1132 09/19/12 1638 09/19/12 2118 09/20/12 0634 09/20/12 1119 09/20/12 1611  GLUCAP 166* 187* 165* 174* 102* 108*   Hemoglobin A1C:  Recent Labs  09/17/12 2340  HGBA1C 6.3*   Fasting Lipid Panel: No results found for this basename: CHOL, HDL, LDLCALC, TRIG, CHOLHDL, LDLDIRECT,  in the last 72 hours Thyroid Function Tests: No results found for this basename: TSH, T4TOTAL, FREET4, T3FREE, THYROIDAB,  in the last 72 hours Anemia Panel: No results found for this basename: VITAMINB12, FOLATE, FERRITIN, TIBC, IRON, RETICCTPCT,  in the last 72 hours Coagulation:  Recent Labs  09/18/12 0220  LABPROT 12.8  INR 0.97   Urine Drug Screen: Drugs of Abuse  No results found for this basename: labopia,  cocainscrnur,  labbenz,  amphetmu,  thcu,  labbarb    Alcohol Level: No results found for this basename: ETH,  in the last 72 hours Urinalysis: No results found for this basename: COLORURINE, APPERANCEUR, LABSPEC, PHURINE, GLUCOSEU, HGBUR, BILIRUBINUR, KETONESUR, PROTEINUR, UROBILINOGEN, NITRITE, LEUKOCYTESUR,  in the last 72  hours Misc. Labs:   Micro: No results found for this or any previous visit (from the past 240 hour(s)).  Studies/Results: Nm Hepatobiliary Liver Func  09/20/2012  *RADIOLOGY REPORT*  Clinical Data:  Cholelithiasis, abdominal pain, question acute cholecystitis  NUCLEAR MEDICINE HEPATOHBILIARY INCLUDE GB  Radiopharmaceutical:  5 mCi Tc-41m mebrofenin  Comparison: None  Findings: Normal tracer extraction from bloodstream indicating normal hepatocellular function. Prompt excretion of tracer into the biliary tree. Small bowel visualized by 10 minutes. Gallbladder visualized at 21 minutes. No focal hepatic retention of tracer.  IMPRESSION: Patent biliary tree including cystic duct. No scintigraphic  evidence of biliary obstruction or acute cholecystitis.   Original Report Authenticated By: Ulyses Southward, M.D.     Medications:  Scheduled: . aspirin EC  81 mg Oral Daily  . carvedilol  6.25 mg Oral BID WC  . enoxaparin (LOVENOX) injection  40 mg Subcutaneous Q24H  . ferrous sulfate  325 mg Oral Daily  . furosemide  80 mg Oral BID  . insulin aspart  0-9 Units Subcutaneous TID WC  . insulin glargine  18 Units Subcutaneous q morning - 10a  . isosorbide mononitrate  30 mg Oral Daily  . pantoprazole  40 mg Oral BID  . piperacillin-tazobactam (ZOSYN)  IV  3.375 g Intravenous Q8H  . potassium chloride SA  20 mEq Oral Daily  . ranolazine  1,000 mg Oral BID  . simvastatin  20 mg Oral QPM  . sodium chloride  3 mL Intravenous Q12H  . sucralfate  1 g Oral 5 X Daily  . vancomycin  1,000 mg Intravenous Q24H   Continuous:  KGM:[id number], nitroGLYCERIN, ondansetron (ZOFRAN) IV, ondansetron, oxyCODONE, polyethylene glycol, technetium TC 45M mebrofenin  Assessment: Principal Problem:   Shortness of breath Active Problems:   Anemia   Renal failure (ARF), acute on chronic   Diabetes type 2, uncontrolled   CAD (coronary artery disease), S/P CABG & stents   GAVE (gastric antral vascular ectasia)   Cirrhosis   Chronic systolic CHF (congestive heart failure)   Gall stones   Hypokalemia   The patient's HIDA scan is essentially negative. He is less symptomatic. Will advance his diet. Will supplement potassium further for treatment of hypokalemia. We'll decrease his Lantus secondary to low-normal blood sugars.  Plan:  1. As above. 2. Await followup recommendations by general surgery. 3. Consider discharge in the morning if he can tolerate his diet this evening. 4. We'll discontinue antibiotics. No obvious sign of active infection.   LOS: 3 days   Amma Crear 09/20/2012, 6:58 PM

## 2012-09-20 NOTE — Progress Notes (Signed)
Pt ambulated 500 ft in hallway with nursing student and rolling walker. Pt tolerated well, back in bed resting. Will continue to monitor.

## 2012-09-20 NOTE — Progress Notes (Signed)
Pt ambulated 500 ft in hallway with nursing student. Pt tolerated well, back in chair eating. Will continue to monitor.

## 2012-09-21 DIAGNOSIS — D649 Anemia, unspecified: Secondary | ICD-10-CM

## 2012-09-21 DIAGNOSIS — N189 Chronic kidney disease, unspecified: Secondary | ICD-10-CM

## 2012-09-21 DIAGNOSIS — N179 Acute kidney failure, unspecified: Secondary | ICD-10-CM

## 2012-09-21 LAB — COMPREHENSIVE METABOLIC PANEL
ALT: 8 U/L (ref 0–53)
Alkaline Phosphatase: 96 U/L (ref 39–117)
BUN: 36 mg/dL — ABNORMAL HIGH (ref 6–23)
CO2: 28 mEq/L (ref 19–32)
GFR calc Af Amer: 40 mL/min — ABNORMAL LOW (ref >90)
GFR calc non Af Amer: 34 mL/min — ABNORMAL LOW (ref >90)
Glucose, Bld: 78 mg/dL (ref 70–99)
Potassium: 3.2 mEq/L — ABNORMAL LOW (ref 3.5–5.1)
Sodium: 137 mEq/L (ref 135–145)
Total Bilirubin: 0.2 mg/dL — ABNORMAL LOW (ref 0.3–1.2)

## 2012-09-21 LAB — GLUCOSE, CAPILLARY
Glucose-Capillary: 68 mg/dL — ABNORMAL LOW (ref 70–99)
Glucose-Capillary: 145 mg/dL — ABNORMAL HIGH (ref 70–99)

## 2012-09-21 LAB — CBC
HCT: 25.9 % — ABNORMAL LOW (ref 39.0–52.0)
Hemoglobin: 8.2 g/dL — ABNORMAL LOW (ref 13.0–17.0)
MCHC: 31.7 g/dL (ref 30.0–36.0)
RBC: 2.99 MIL/uL — ABNORMAL LOW (ref 4.22–5.81)

## 2012-09-21 MED ORDER — POTASSIUM CHLORIDE CRYS ER 20 MEQ PO TBCR: 20.0000 meq | EXTENDED_RELEASE_TABLET | Freq: Two times a day (BID) | ORAL | Status: DC

## 2012-09-21 MED ORDER — FUROSEMIDE 80 MG PO TABS: 80.0000 mg | ORAL_TABLET | Freq: Two times a day (BID) | ORAL | Status: DC

## 2012-09-21 MED ORDER — RANOLAZINE ER 1000 MG PO TB12: 1000.0000 mg | ORAL_TABLET | Freq: Two times a day (BID) | ORAL | Status: DC

## 2012-09-21 NOTE — Progress Notes (Signed)
Pt ambulated with assistance and rolling walker. Pt tolerated well.

## 2012-09-21 NOTE — Progress Notes (Signed)
Discharge instructions given to pt along with new prescriptions. Pt verbalized understanding of new instructions. Pt is stable for discharge with wife this afternoon. Will provide wheelchair to escort pt to the car.

## 2012-09-22 ENCOUNTER — Encounter (HOSPITAL_COMMUNITY): Payer: Self-pay | Admitting: Internal Medicine

## 2012-09-22 ENCOUNTER — Telehealth: Payer: Self-pay | Admitting: Cardiology

## 2012-09-22 NOTE — Discharge Summary (Signed)
Physician Discharge Summary  KAELAN AMBLE AVW:[id number] DOB: [birth year] DOA: 09/17/2012  PCP: Dina Rich, MD  Admit date: 09/17/2012 Discharge date: 09/21/2012  Time spent: Greater than 30 minutes  Recommendations for Outpatient Follow-up:  1. Hospice as an alternative to chronic management of his end-stage CHF was mentioned to the patient but he became too upset to continue the conversation. Consider revisiting the conversation with the patient with subsequent hospital admissions.  Discharge Diagnoses:   1. Acute on chronic systolic congestive heart failure. Cardiac MRI March 2014 revealed an ejection fraction of 37%. ACE inhibitor not given because of chronic kidney disease. 2. Coronary artery disease status post CABG and stents in the past. 3. Right upper quadrant pain, query biliary colic versus musculoskeletal strain. HIDA scan normal. 4. Gallstones/cholelithiasis. 5. Cardiac cirrhosis. 6. Acute on chronic renal failure with stage III chronic kidney disease. 7. Chronic blood loss anemia. 8. Hypokalemia. 9. Type 2 diabetes mellitus. 10. Query HCAP.  Discharge Condition: Chronically debilitated but stable.  Diet recommendation: Heart healthy, carbohydrate modified.  Filed Weights   09/19/12 0549 09/20/12 0341 09/21/12 0622  Weight: 77.565 kg (171 lb) 78.2 kg (172 lb 6.4 oz) 78.472 kg (173 lb)    History of present illness:   67 yo with history of CAD s/p CABG, systolic CHF, cirrhosis, CKD, and GI bleeding from [facility] presented for shortness of breath. He was admitted to hospital earlier this month (March) for the same reason. He was admitted with acute on chronic systolic CHF. Cardiac MRI showed EF 37%. He was started on milrinone and diuresed while in the hospital. RHC on milrinone showed relatively compensated filling pressures and he was converted over to po Lasix. He was noted to have significant ascites and had a therapeutic paracentesis while in the hospital draining 4.8  L. Imaging showed cirrhosis. Hepatitis panel and AFP were negative. He also was anemic and was followed by GI with EGD and APC of [facility].  He felt like his abdomen was filling up with fluid again. He was walking with a walker and getting home PT. He is short of breath after walking 75-100 feet. No chest pain. No orthopnea/PND. He has a follow up appointment with GI in a couple of weeks. In the emergency department, he was afebrile and  intermittently bradycardic. His systolic blood pressure when the 100s. He was oxygenating 99% on nasal cannula oxygen. His lab data were significant for a glucose of 67, BUN of 55, creatinine of 2.26, normal liver transaminases, hemoglobin of 8.9, normal troponin I., and proBNP of 11,010. His chest x-ray revealed opacity in the right lung and stable right pleural fluid. His EKG revealed PACs, sinus rhythm, first degree AV block, but no acute ST or T wave mellitus. He was admitted for further evaluation and management.   Hospital Course:  The patient was restarted on most of not all of his chronic cardiac medications. Hydralazine was discontinued however because of his low-normal blood pressures. Lasix was increased from 40 mg daily to 80 mg twice a day during the last hospitalization, but apparently, the patient was not taking it. Nevertheless, the higher dose was continued. Imdur was decreased to 15 mg daily. Ranolazine was added. Sucralfate and Protonix were continued.  He was started on broad-spectrum antibiotics with Zosyn and vancomycin for possible healthcare acquired pneumonia. However, the patient had no fever, had no. Cough, and had no leukocytosis. It was believed that the opacities were the consequence of congestive heart failure exacerbation. He complained of right upper  quadrant abdominal pain. Therefore an ultrasound of his abdomen was ordered. It revealed gallstones with a thickened gallbladder wall and mild ascites. Radiographically, the radiologist felt this was  likely secondary to cirrhosis. Nevertheless general surgery was consulted. Per general surgeon, Dr. Michaell Cowing, he doubted that the patient was having biliary colic. He recommended advancing the diet as tolerated and continuing analgesics and antiemetics as needed. He will ordered a HIDA scan for further evaluation. In essence, a HIDA scan was negative.  The patient continued to have intermittent "burning" right upper quadrant pain. Upon my examination, there was very little tenderness upon palpation. There was no herpetic rash. Once his diet was restarted, he had no complaints of pain, nausea, or vomiting.  During the hospitalization, he diuresed 2.5 L. He had no shortness of breath at the time of discharge. After several days of IV antibiotics, all antibiotics were discontinued at the time of discharge. Clinically, it he did not appear to have hospital acquired pneumonia. His hemoglobin remained more or less stable. His renal function actually improved with diuresis. His creatinine was 1.96 at the time of discharge. His glycemic control was excellent during the hospitalization on insulin therapy.  It was mentioned to the dictating physician that hospice had been mentioned to the patient in the recent past due to his severe congestive heart failure. However, when I mentioned to him, he became upset. Therefore, I discussed hospice management no further. Perhaps this topic can be readdressed with subsequent hospitalizations.  Procedures:  None  Consultations:  General surgeon, Dr. Michaell Cowing  Discharge Exam: Filed Vitals:   09/20/12 1500 09/20/12 1700 09/20/12 2053 09/21/12 0622  BP: 110/67 111/74 109/66 124/62  Pulse: 73 73 73 93  Temp:   98.5 F (36.9 C) 98.4 F (36.9 C)  TempSrc:   Oral Oral  Resp:   18 18  Height:      Weight:    78.472 kg (173 lb)  SpO2: 98%  98% 98%    General: Alert debilitated 67 year old Caucasian man laying in bed, in no acute distress. Cardiovascular: S1, S2, with a  2/6 systolic murmur. Respiratory: Decreased breath sounds in the bases. No audible wheezes or crackles. Breathing is nonlabored. Abdomen: Positive bowel sounds, soft, mildly tender in the right upper quadrant without masses palpated. No Murphy's sign. No herpetic rash.  Discharge Instructions  Discharge Orders   Future Appointments Provider Department Dept Phone   10/16/2012 10:15 AM Laurey Morale, MD [facility] Main Office [place]) [phone]   Future Orders Complete By Expires     Diet - low sodium heart healthy  As directed     Discharge instructions  As directed     Comments:      Take new medications and doses as prescribed. Follow up with your doctors in 1-2 weeks.  Avoid eating fried foods and foods high in fat. Eat baked or broiled foods if cooked.    Increase activity slowly  As directed         Medication List    STOP taking these medications       hydrALAZINE 25 MG tablet  Commonly known as:  APRESOLINE      TAKE these medications       aspirin EC 81 MG tablet  Take 81 mg by mouth daily.     B complex-vitamin C-folic acid 1 MG tablet  Take 1 tablet by mouth daily.     carvedilol 6.25 MG tablet  Commonly known as:  COREG  Take  1 tablet (6.25 mg total) by mouth 2 (two) times daily.     cholecalciferol 1000 UNITS tablet  Commonly known as:  VITAMIN D  Take 1,000 Units by mouth daily.     docusate sodium 100 MG capsule  Commonly known as:  COLACE  Take 100 mg by mouth at bedtime.     ferrous sulfate 325 (65 FE) MG tablet  Take 325 mg by mouth daily.     furosemide 80 MG tablet  Commonly known as:  LASIX  Take 1 tablet (80 mg total) by mouth 2 (two) times daily.     insulin glargine 100 UNIT/ML injection  Commonly known as:  LANTUS  Inject 18 Units into the skin every morning.     insulin lispro 100 UNIT/ML injection  Commonly known as:  HUMALOG  Inject 2-5 Units into the skin 3 (three) times daily before meals. Based on sliding scale      isosorbide mononitrate 30 MG 24 hr tablet  Commonly known as:  IMDUR  Take 1 tablet (30 mg total) by mouth daily.     multivitamin with minerals Tabs  Take 1 tablet by mouth daily.     nitroGLYCERIN 0.4 MG SL tablet  Commonly known as:  NITROSTAT  Place 0.4 mg under the tongue every 5 (five) minutes as needed for chest pain. If pain continues after 3 doses call 911     ONE TOUCH ULTRA TEST test strip  Generic drug:  glucose blood     oxyCODONE 5 MG immediate release tablet  Commonly known as:  Oxy IR/ROXICODONE  Take 5 mg by mouth every 4 (four) hours as needed for pain.     pantoprazole 40 MG tablet  Commonly known as:  PROTONIX  Take 1 tablet (40 mg total) by mouth 2 (two) times daily.     polyethylene glycol packet  Commonly known as:  MIRALAX / GLYCOLAX  Take 17 g by mouth daily as needed (for constipation).     potassium chloride SA 20 MEQ tablet  Commonly known as:  K-DUR,KLOR-CON  Take 1 tablet (20 mEq total) by mouth 2 (two) times daily.     ranolazine 1000 MG SR tablet  Commonly known as:  RANEXA  Take 1 tablet (1,000 mg total) by mouth 2 (two) times daily. New heart medication.     simvastatin 20 MG tablet  Commonly known as:  ZOCOR  Take 20 mg by mouth every evening.     sucralfate 1 G tablet  Commonly known as:  CARAFATE  Take 1 g by mouth 5 (five) times daily.     VITAMIN B-12 PO  Take 1 tablet by mouth daily.           Follow-up Information   Follow up with Marca Ancona, MD. Schedule an appointment as soon as possible for a visit in 1 week.   Contact information:   [address] [place] [address] [phone]       Follow up with DOUGH,ROBERT, MD. Schedule an appointment as soon as possible for a visit in 2 weeks.   Contact information:   [address] [place] [address] [phone]        The results of significant diagnostics from this hospitalization (including imaging, microbiology,  ancillary and laboratory) are listed below for reference.    Significant Diagnostic Studies: Ct Abdomen Pelvis Wo Contrast  09/09/2012  *RADIOLOGY REPORT*  Clinical Data: Abnormality on ultrasound  CT  ABDOMEN AND PELVIS WITHOUT CONTRAST  Technique:  Multidetector CT imaging of the abdomen and pelvis was performed following the standard protocol without intravenous contrast.  Comparison: Ultrasound 09/06/2012  Findings: There is a is moderate right effusion.  There is right basilar atelectasis and potential consolidation.  Left base is clear.  No pericardial fluid.  The liver has a shrunken nodular contour.  The liver cannot be adequately reassessed for mass without contrast.  No discrete mass identified.  There are gallstones in the gallbladder which is nondistended.  The pancreas is atrophic.  Spleen, adrenal glands, kidneys are normal.  Stomach, small bowel, colon grossly normal.  There is a moderate volume intraperitoneal free fluid seen along the pericolic gutters and surrounding the liver and spleen.  There is a moderate volume of free fluid the pelvis.  Post hysterectomy anatomy.  Bladder appears normal. Review of  bone windows demonstrates no aggressive osseous lesions.  IMPRESSION:  1.  The liver is not adequately evaluated without IV contrast. Liver is nodular and shrunken node consistent with cirrhosis. Cannot exclude an occult mass with the degree of nodularity. Consider MRI which has better soft tissue contrast if the patient cannot receive IV contrast in the  future. 2.  Moderate volume ascites  likely relates to portal hypertension/cirrhosis. 3.  Cholelithiasis without cholecystitis. 4.  Consolidation versus atelectasis at the right lung base with small effusions.   Original Report Authenticated By: Genevive Bi, M.D.    Dg Chest 2 View  09/17/2012  *RADIOLOGY REPORT*  Clinical Data: Shortness of breath.  CHEST - 2 VIEW  Comparison: 09/01/2012.  Findings: A poor inspiration is again  demonstrated.  The heart remains grossly normal in size with post CABG changes.  Interval mild patchy opacity at the left lung base and increased patchy opacity in the right lung.  Stable right pleural fluid.  Diffuse osteopenia and right shoulder postoperative changes.  IMPRESSION:  1.  Interval small amount of patchy atelectasis or pneumonia at the left lung base. 2.  Progressive patchy opacity in the right lung, suspicious for pneumonia. 3.  Stable right pleural fluid.   Original Report Authenticated By: Beckie Salts, M.D.    Ct Chest Wo Contrast  09/17/2012  *RADIOLOGY REPORT*  Clinical Data: Shortness of breath.  Right lung density and elevated D-dimer.  CT CHEST WITHOUT CONTRAST  Technique:  Multidetector CT imaging of the chest was performed following the standard protocol without IV contrast.  Comparison: None.  Findings: Postoperative changes in the mediastinum.  Coronary artery calcification and stents.  The normal heart size.  Normal caliber thoracic aorta with calcification.  No abnormal mediastinal fluid collections.  Mediastinal lymph nodes are not pathologically enlarged.  The esophagus is decompressed.  There is a right pleural effusion with consolidation in the right lung base.  Air bronchograms are present.  The appearance is nonspecific but compatible with pneumonia or contusion.  Airways appear patent.  No pneumothorax.  Scattered fibrosis in the lungs. No focal consolidation on the left.  Included portions of the upper abdominal organs demonstrate changes of hepatic cirrhosis with upper abdominal ascites.  Cholelithiasis. Surgical clips in the left upper quadrant.  Vascular calcifications.  Changes of osteoporosis with multiple thoracic vertebral compression fractures, some of which have been treated with kyphoplasty.  Degenerative changes in the thoracic spine.  Old bilateral rib fractures.  Acute appearing fractures of the posterolateral right eighth, ninth, tenth, and eleventh ribs.   IMPRESSION: Right pleural effusion with basilar consolidation.  Multiple acute  right lower rib fractures.  Old bilateral rib fractures and osteoporotic fractures of the spine.  No pneumothorax.   Original Report Authenticated By: Burman Nieves, M.D.    Nm Hepatobiliary Liver Func  09/20/2012  *RADIOLOGY REPORT*  Clinical Data:  Cholelithiasis, abdominal pain, question acute cholecystitis  NUCLEAR MEDICINE HEPATOHBILIARY INCLUDE GB  Radiopharmaceutical:  5 mCi Tc-4m mebrofenin  Comparison: None  Findings: Normal tracer extraction from bloodstream indicating normal hepatocellular function. Prompt excretion of tracer into the biliary tree. Small bowel visualized by 10 minutes. Gallbladder visualized at 21 minutes. No focal hepatic retention of tracer.  IMPRESSION: Patent biliary tree including cystic duct. No scintigraphic evidence of biliary obstruction or acute cholecystitis.   Original Report Authenticated By: Ulyses Southward, M.D.    US Abdomen Complete  09/18/2012  *RADIOLOGY REPORT*  Clinical Data:  Abdominal pain  COMPLETE ABDOMINAL ULTRASOUND  Comparison:  September 06, 2012  Findings:  Mild ascites is noted.  Gallbladder:  Multiple gallstones are noted.  Gallbladder wall thickening is noted, but this may be due to adjacent ascites or hepatocellular disease.  Common bile duct:  Measures 4.2 mm which is within normal limits.  Liver:  Nodular margins are noted with increased echogenicity consistent with history of hepatic cirrhosis. No focal mass is noted, although correlation with the alpha-fetoprotein level is recommended given the presence of cirrhosis.  IVC:  Not visualized due to overlying bowel gas.  Pancreas:  Not visualized due to overlying bowel gas.  Spleen:  Measures 6.7 cm which is within normal limits.  Right Kidney:  Measures 10.1 cm which is within normal limits.  Left Kidney:  Measures 10.2 cm which is within normal limits.  Abdominal aorta:  No aneurysm identified.  IMPRESSION: Mild ascites.   Cholelithiasis with gallbladder wall thickening; this most likely is due to ascites or adjacent hepatocellular disease.  Hepatic cirrhosis is noted and correlation with alpha- fetoprotein level is recommended.   Original Report Authenticated By: Lupita Raider.,  M.D.    US Abdomen Complete  09/06/2012  *RADIOLOGY REPORT*  Clinical Data:  Cirrhosis, ascites  COMPLETE ABDOMINAL ULTRASOUND  Comparison:  None.  Findings:  Gallbladder:  Multiple gallstones are seen lying dependently within an otherwise normal-appearing gallbladder.  Negative sonographic Murphy's sign.  Common bile duct:  Normal in size measuring 4.6 mm in diameter.  Liver:  There is marked heterogeneity of the hepatic parenchyma. Note is made of a nodular contour of the liver edge. There is an ill-defined area of mixed echogenicity involving the posterior aspect of the right lobe of the liver (image 31).  No definite intrahepatic ductal dilatation.  Large volume intra-abdominal ascites.  IVC:  Obscured by bowel gas  Pancreas:  Obscured by bowel gas  Spleen:  Normal in size measuring 6.8 cm in length  Right Kidney:  Normal cortical thickness, echogenicity and size, measuring 9.8 cm in length.  No focal renal lesions.  No echogenic renal stones.  No urinary obstruction.  Left Kidney:  Normal cortical thickness, echogenicity and size, measuring 9.9 cm in length.  No focal renal lesions.  No echogenic renal stones.  No urinary obstruction.  Abdominal aorta:  Obscured by bowel gas.  IMPRESSION:  1.  Shrunken, nodular appearance of the liver with large volume ascites, findings compatible with provided history of cirrhosis.  2.  Ill-defined area of mixed echogenicity involving the posterior aspect of the right lobe of the liver, suboptimally evaluated on this examination, though a discrete hepatic mass is not excluded. Further evaluation  with alpha-fetoprotein level and abdominal MRI is recommended. 3.  Cholelithiasis without evidence of cholecystitis.   This was made a call report.   Original Report Authenticated By: Tacey Ruiz, MD    Dg Chest Right Decubitus  09/03/2012  *RADIOLOGY REPORT*  Clinical Data: Right pleural effusion.  CHEST - RIGHT DECUBITUS  Comparison: 09/01/2012  Findings: A right lateral decubitus view of the chest demonstrates no definite layering effusion. Right lateral basilar opacity is noted and could represent consolidation versus loculated pleural fluid.  IMPRESSION: No evidence of free flowing effusion.  Right lateral basilar opacity again identified - question consolidation versus loculated pleural fluid.   Original Report Authenticated By: Harmon Pier, M.D.    Nm Pulmonary Perf And Vent  09/18/2012  *RADIOLOGY REPORT*  Clinical Data:  Shortness of breath.  NUCLEAR MEDICINE VENTILATION - PERFUSION LUNG SCAN  Technique:  Wash-in, equilibrium, and wash-out phase ventilation images were obtained using Tc-DTPA.  Perfusion images were obtained in multiple projections after intravenous injection of Tc-84m MAA.  Radiopharmaceuticals:  40 mCi Tc-DTPA and 6.0 mCi Tc-12m MAA.  Comparison:  Chest radiograph 09/17/2012.  Findings: There is heterogeneous uptake on perfusion images without discrete wedge-shaped defect.  Photopenia at the right lung base corresponds to a pleural effusion on yesterday's chest radiograph.  Ventilation images show patchiness bilaterally.  IMPRESSION: Low-intermediate probability for pulmonary embolus.   Original Report Authenticated By: Leanna Battles, M.D.    US Paracentesis  09/15/2012  *RADIOLOGY REPORT*  Clinical Data: Congestive heart failure, chronic kidney disease, cirrhosis, recurrent ascites  ULTRASOUND GUIDED PARACENTESIS  Comparison:  Previous paracentesis  An ultrasound guided paracentesis was thoroughly discussed with the patient and questions answered.  The benefits, risks, alternatives and complications were also discussed.  The patient understands and wishes to proceed with the procedure.  Written  consent was obtained.  Ultrasound was performed to localize and mark an adequate pocket of fluid in the right lower quadrant of the abdomen.  The area was then prepped and draped in the normal sterile fashion.  1% Lidocaine was used for local anesthesia.  Under ultrasound guidance a 19 gauge Yueh catheter was introduced.  Paracentesis was performed.  The catheter was removed and a dressing applied.  Complications:  None  Findings:  A total of approximately 4.3 liters of clear yellow fluid was removed.  A fluid sample was sent for laboratory analysis.  IMPRESSION: Successful ultrasound guided paracentesis yielding 4.3 liters of ascites.  Read by Brayton El PA-C   Original Report Authenticated By: Irish Lack, M.D.    US Paracentesis  09/07/2012  *RADIOLOGY REPORT*  Clinical Data: CHF, chronic kidney disease, cirrhosis, ascites. Request is made for diagnostic and therapeutic paracentesis up to 6 liters.  ULTRASOUND GUIDED DIAGNOSTIC AND THERAPEUTIC  PARACENTESIS  An ultrasound guided paracentesis was thoroughly discussed with the patient and questions answered.  The benefits, risks, alternatives and complications were also discussed.  The patient understands and wishes to proceed with the procedure.  Written consent was obtained.  Ultrasound was performed to localize and mark an adequate pocket of fluid in the right lower quadrant of the abdomen.  The area was then prepped and draped in the normal sterile fashion.  1% Lidocaine was used for local anesthesia.  Under ultrasound guidance a 19 gauge Yueh catheter was introduced.  Paracentesis was performed.  The catheter was removed and a dressing applied.  Complications:  none  Findings:  A total of approximately 6 liters of clear, yellow fluid was removed.  A fluid sample was sent for laboratory analysis.  IMPRESSION: Successful ultrasound guided diagnostic and therapeutic paracentesis yielding 6 liters of ascites.  Read by: Jeananne Rama, P.A.-C   Original  Report Authenticated By: Tacey Ruiz, MD    Dg Chest Port 1 View  09/01/2012  *RADIOLOGY REPORT*  Clinical Data: CHF, cough, shortness of breath.  PORTABLE CHEST - 1 VIEW  Comparison: 10/12/2007  Findings: Prominent cardiomediastinal contours.  Central vascular congestion.  Moderate right pleural effusion and associated airspace opacity.  Mild left lung base opacity.  Degraded by rotation.  Status post median sternotomy and CABG.  Evidence of prior vertebroplasties.  No definite pneumothorax.  IMPRESSION: Right pleural effusion and associated airspace opacity; atelectasis versus pneumonia.  Mild left lung base opacity, favor atelectasis.  Prominent cardiomediastinal contours status post median sternotomy and CABG.   Original Report Authenticated By: Jearld Lesch, M.D.    Dg Abd Portable 1v  09/01/2012  *RADIOLOGY REPORT*  Clinical Data: Recent capsule endoscopy.  Evaluate for retained capsule.  PORTABLE ABDOMEN - 1 VIEW  Comparison: None.  Findings: The bowel gas pattern is within normal limits.  No retained capsule camera identified.  Surgical clips and staples noted, as well as vascular calcifications.  IMPRESSION: No acute findings.  No retained capsule camera identified.   Original Report Authenticated By: Myles Rosenthal, M.D.    Mr Card Morphology W/o Cm  09/04/2012  *RADIOLOGY REPORT*  Clinical Data: Cardiomyopathy, unable to assess LV function by echo.  MR CARDIA MORPHOLOGY WITHOUT CONTRAST  GE 1.5 T magnet with dedicated cardiac coil.  FIESTA sequences for function and morphology.  Free-breathing sequences were used.  No contrast was used due to renal dysfunction.  EF was calculated at a dedicated workstation.  Contrast:  Comparison: None.  Findings: Difficult study as patient had a hard time holding his breath.  Free-breathing sequences were used.  Normal left ventricular size and wall thickness.  The mid to apical anterior, mid to apical anteroseptal, and mid to apical inferior walls appeared  moderately to severely hypokinetic. EF was calculated to be 37%.  Normal right ventricular size and systolic function. Moderate left atrial enlargement.  Mild to moderate right atrial enlargement.  There appeared to be mild mitral regurgitation though flow sequences to quantify were not done.  Trileaflet aortic valve without significant stenosis.  Measurements:  LV EDV 193 mL  LV SV 71 mL  LV EF 37%  IMPRESSION: Normal LV size with moderate systolic dysfunction, EF 37%.  Wall motion abnormalities as noted above.  Technically difficult study due to poor breath-holding.   Original Report Authenticated By: Marca Ancona     Microbiology: No results found for this or any previous visit (from the past 240 hour(s)).   Labs: Basic Metabolic Panel:  Recent Labs Lab 09/17/12 1749 09/17/12 1822 09/18/12 0220 09/20/12 1410 09/21/12 0450  NA 134* 135 133* 138 137  K 4.5 4.4 3.8 3.2* 3.2*  CL 97 97 94* 101 101  CO2 27 30 30 29 28   GLUCOSE 72 67* 125* 78 78  BUN 56* 55* 51* 39* 36*  CREATININE 2.22* 2.26* 2.05* 1.97* 1.96*  CALCIUM 8.7 8.8 8.6 8.4 8.3*  MG  --   --  2.5  --   --    Liver Function Tests:  Recent Labs Lab 09/17/12 1822 09/18/12 0220 09/20/12 1410 09/21/12 0450  AST 20 18 19 20   ALT 9 8 8 8   ALKPHOS 113 109 94 96  BILITOT 0.3  0.3 0.3 0.2*  PROT 6.0 6.0 5.5* 5.5*  ALBUMIN 2.3* 2.3* 2.0* 2.1*   No results found for this basename: LIPASE, AMYLASE,  in the last 168 hours No results found for this basename: AMMONIA,  in the last 168 hours CBC:  Recent Labs Lab 09/17/12 1749 09/17/12 1822 09/18/12 0220 09/20/12 1410 09/21/12 0450  WBC 8.2 7.5 7.4 7.6 7.2  NEUTROABS  --  4.3  --   --   --   HGB 8.3* 8.9* 9.1* 8.2* 8.2*  HCT 25.2* 27.3* 28.3* 25.3* 25.9*  MCV 84.0 86.1 86.0 86.1 86.6  PLT 310 300 270 267 266   Cardiac Enzymes:  Recent Labs Lab 09/17/12 1800 09/18/12 0220 09/18/12 0410 09/18/12 1218  TROPONINI <0.30 <0.30 <0.30 <0.30   BNP: BNP (last 3  results)  Recent Labs  09/01/12 0430 09/17/12 1719  PROBNP 10584.0* 11010.0*   CBG:  Recent Labs Lab 09/20/12 1611 09/20/12 2050 09/21/12 0616 09/21/12 0639 09/21/12 1120  GLUCAP 108* 125* 68* 89 145*       Signed:  Dewane Timson  [facility] 09/22/2012, 3:49 PM

## 2012-09-22 NOTE — Telephone Encounter (Signed)
Spoke with pt. Scheduled p hosp appt for pt.

## 2012-09-22 NOTE — Telephone Encounter (Signed)
New Problem:    Patient called in wanting to speak with you about appointments and stuff.  Please call back.

## 2012-09-23 ENCOUNTER — Telehealth: Payer: Self-pay | Admitting: Physician Assistant

## 2012-09-23 NOTE — Telephone Encounter (Signed)
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  He was seen by Dr. Shirlee Latch in early march for a/c systolic CHF.  He was seen in the office on 3/27 and Lasix was increased due to increasing volume.  He was readmitted with increasing abdominal girth and just d/c yesterday. He called today and notes he gained 4 lbs over night. Feels good.  Denies dyspnea, orthopnea, CP, syncope.  Wt 179 lbs today. Reviewed d/c notes.  Spoke with Dr. Marca Ancona. Recommend continuing current dose of Lasix (80 mg bid).  If weight higher tomorrow, he will increase Lasix to 120 mg bid x 2 days. He knows to call back if symptoms worsening. I advised him to call the office Monday for sooner f/u with Dr. Marca Ancona. Tereso Newcomer, PA-C  9:28 AM 09/23/2012

## 2012-09-24 ENCOUNTER — Telehealth: Payer: Self-pay | Admitting: Physician Assistant

## 2012-09-24 NOTE — Telephone Encounter (Signed)
[name] is a 67 y.o. male whom I spoke with yesterday regarding volume overload. His weight was up 4 lbs.  We asked him to continue his current dose of Lasix and increase his dose if his weight increased more. He called today b/c his abdomen feels somewhat more tight. He has a hx of ascites s/p prior tap.  His weight is actually down 1 lb.   I advised him to increase Lasix to 120 mg bid for today only.  He should resume 80 mg bid tomorrow.  I will fwd to Dr. Laurey Morale RN to get the patient scheduled for office visit tomorrow for further evaluation. Tereso Newcomer, PA-C  9:26 AM 09/24/2012

## 2012-09-24 NOTE — Telephone Encounter (Signed)
Needs office appt asap

## 2012-09-25 ENCOUNTER — Ambulatory Visit (INDEPENDENT_AMBULATORY_CARE_PROVIDER_SITE_OTHER): Payer: No Typology Code available for payment source | Admitting: Cardiology

## 2012-09-25 ENCOUNTER — Ambulatory Visit (HOSPITAL_COMMUNITY)
Admission: RE | Admit: 2012-09-25 | Discharge: 2012-09-25 | Disposition: A | Discharge status: Home / Self Care | Payer: PRIVATE HEALTH INSURANCE | Source: Ambulatory Visit | Attending: Cardiology | Admitting: Cardiology

## 2012-09-25 ENCOUNTER — Encounter: Payer: Self-pay | Admitting: Cardiology

## 2012-09-25 ENCOUNTER — Encounter: Payer: Self-pay | Admitting: Internal Medicine

## 2012-09-25 VITALS — BP 130/70 | HR 81 | Ht 67.0 in | Wt 179.0 lb

## 2012-09-25 DIAGNOSIS — N185 Chronic kidney disease, stage 5: Secondary | ICD-10-CM

## 2012-09-25 DIAGNOSIS — I2510 Atherosclerotic heart disease of native coronary artery without angina pectoris: Secondary | ICD-10-CM

## 2012-09-25 DIAGNOSIS — R188 Other ascites: Secondary | ICD-10-CM | POA: Insufficient documentation

## 2012-09-25 DIAGNOSIS — K922 Gastrointestinal hemorrhage, unspecified: Secondary | ICD-10-CM

## 2012-09-25 DIAGNOSIS — E785 Hyperlipidemia, unspecified: Secondary | ICD-10-CM

## 2012-09-25 DIAGNOSIS — I5022 Chronic systolic (congestive) heart failure: Secondary | ICD-10-CM

## 2012-09-25 DIAGNOSIS — I509 Heart failure, unspecified: Secondary | ICD-10-CM

## 2012-09-25 DIAGNOSIS — K7460 Unspecified cirrhosis of liver: Secondary | ICD-10-CM

## 2012-09-25 DIAGNOSIS — I658 Occlusion and stenosis of other precerebral arteries: Secondary | ICD-10-CM

## 2012-09-25 MED ORDER — POTASSIUM CHLORIDE CRYS ER 20 MEQ PO TBCR: 20.0000 meq | EXTENDED_RELEASE_TABLET | Freq: Two times a day (BID) | ORAL | Status: DC

## 2012-09-25 MED ORDER — SPIRONOLACTONE 25 MG PO TABS: 25.0000 mg | ORAL_TABLET | Freq: Every day | ORAL | Status: DC

## 2012-09-25 MED ORDER — POTASSIUM CHLORIDE CRYS ER 20 MEQ PO TBCR: 20.0000 meq | EXTENDED_RELEASE_TABLET | Freq: Every day | ORAL | Status: DC

## 2012-09-25 MED ORDER — HYDRALAZINE HCL 25 MG PO TABS: 25.0000 mg | ORAL_TABLET | Freq: Three times a day (TID) | ORAL | Status: DC

## 2012-09-25 MED ORDER — HYDRALAZINE HCL 25 MG PO TABS: 12.5000 mg | ORAL_TABLET | Freq: Three times a day (TID) | ORAL | Status: DC

## 2012-09-25 NOTE — Patient Instructions (Addendum)
Your provider reccommends that you have a paracentesis today  You have been referred to gastroenterology  Your physician has requested that you have a carotid duplex. This test is an ultrasound of the carotid arteries in your neck. It looks at blood flow through these arteries that supply the brain with blood. Allow one hour for this exam. There are no restrictions or special instructions.  Your physician has recommended you make the following change in your medication: START Spironolactone 25 mg daily, DECREASE your Potassium to once daily,  CHANGE your Hydralazine to 12.5 mg three times daily  Your physician recommends that you return for lab work in: one week (bmet, bnp)  Your physician recommends that you schedule a follow-up appointment in: next week with Tereso Newcomer and 1 month with Dr Shirlee Latch

## 2012-09-25 NOTE — Procedures (Signed)
Successful US guided paracentesis from RLQ.  Yielded 4L of clear yellow fluid.  No immediate complications.  Pt tolerated well.   Specimen was not sent for labs.  Brayton El PA-C 09/25/2012 3:41 PM

## 2012-09-25 NOTE — Telephone Encounter (Signed)
Pt was called and appt was scheduled for 09/25/12 at 12:45pm.

## 2012-09-26 ENCOUNTER — Telehealth: Payer: Self-pay | Admitting: Internal Medicine

## 2012-09-26 DIAGNOSIS — R188 Other ascites: Secondary | ICD-10-CM | POA: Insufficient documentation

## 2012-09-26 MED ORDER — COLCHICINE 0.6 MG PO TABS: ORAL_TABLET | ORAL | Status: DC

## 2012-09-26 NOTE — Progress Notes (Signed)
Patient ID: [name], male   DOB: 1946-01-09, 67 y.o.   MRN: [id number] PCP Dr. 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  I initially saw him in the hospital 3/14.  He was admitted with acute on chronic systolic CHF.  Cardiac MRI showed EF 37% (echo was difficult study).  He was started on milrinone and diuresed while in the hospital.  RHC on milrinone showed relatively compensated filling pressures and he was converted over to po Lasix.  He was noted to have significant ascites and had a therapeutic paracentesis while in the hospital.  Imaging showed cirrhosis. Hepatitis panel and AFP were negative.  He also was anemic and was followed by GI with EGD and APC of GAVE.  I saw him on 3/26, and he had re-developed significant abdominal swelling.  I sent him for a 2nd US-guided paracentesis which was done on 3/28 with removal of 4.3 L ascites.  On 3/30, he was re-admitted with ongoing dyspnea and RUQ pain.  He had a workup for cholecystitis which showed cholelithiasis but not cholecystitis.  He was diuresed a small amount and discharged home.  Cardiology was not consulted this admission.   He is now back home with his wife.  Weight is stable compared to last appointment.  Overall, his breathing is stable.  He can walk about 100 yards before getting short of breath.  No chest pain.  No orthopnea/PND.  He has followup with GI in a couple of weeks.  Unfortunately, he feels like his abdomen is swelling up again.   Labs (3/14): K 4, creatinine 2.3, HCT 25.7 Labs (4/14): K 3.2, creatinine 1.97, LFTs normal, albumin 2.1, HCT 25.9  ECG: NSR with low voltage P waves, old inferior and anterolateral MIs  Past Medical History:  1. Type II diabetes  2. HTN  3. Hyperlipidemia  4. CAD:  - CABG 1999  - PCI 2007 to SVG-OM  - Last cath 2008 showed patent LIMA-LAD, SVG-OM, and SVG-PDA. Patient had PTCA to PLV at that time.   5. Systolic CHF: Echo in 2009 showed EF 50-55%. ? EF 40% on echo subsequently in [place]. Echo in 3/14 at [facility] was a very difficult study.  Cardiac MRI (without contrast given elevated creatinine) showed EF 37% with mid-apical anterior, anteroseptal, and inferior moderate to severe hypokinesis.  RHC (3/14) with mean RA 7, PA 31/19, mean PCWP 21, CI 3.66 (on milrinone).   6. Obesity  7. CKD  8. Cirrhosis: Possibly due to NASH.  No h/o ETOH.  Hepatitis panel and AFP negative in 3/14. He has ascites with prior paracentesis.  9. GAVE with anemia: EGD with APC in 3/14.  He is on PPI.  10. CKD 11. H/o CEA  SH: Married, retired, lives in [place].  He no longer smokes.   FH: CAD  ROS: All systems reviewed and negative except as per HPI.   Current Outpatient Prescriptions  Medication Sig Dispense Refill  . aspirin EC 81 MG tablet Take 81 mg by mouth daily.      . B Complex-C-Folic Acid (B COMPLEX-VITAMIN C-FOLIC ACID) 1 MG tablet Take 1 tablet by mouth daily.       . carvedilol (COREG) 6.25 MG tablet Take 1 tablet (6.25 mg total) by mouth 2 (two) times daily.  60 tablet  3  . cholecalciferol (VITAMIN D) 1000 UNITS tablet Take 1,000 Units by mouth daily.      Marland Kitchen  Cyanocobalamin (VITAMIN B-12 PO) Take 1 tablet by mouth daily.      Marland Kitchen docusate sodium (COLACE) 100 MG capsule Take 100 mg by mouth at bedtime.      . ferrous sulfate 325 (65 FE) MG tablet Take 325 mg by mouth daily.       . furosemide (LASIX) 80 MG tablet Take 1 tablet (80 mg total) by mouth 2 (two) times daily.  60 tablet  6  . insulin glargine (LANTUS) 100 UNIT/ML injection Inject 18 Units into the skin every morning.      . insulin lispro (HUMALOG) 100 UNIT/ML injection Inject 2-5 Units into the skin 3 (three) times daily before meals. Based on sliding scale      . isosorbide mononitrate (IMDUR) 30 MG 24 hr tablet Take 1 tablet (30 mg total) by mouth daily.  30 tablet  0  . Multiple Vitamin (MULITIVITAMIN WITH MINERALS) TABS Take 1  tablet by mouth daily.      . nitroGLYCERIN (NITROSTAT) 0.4 MG SL tablet Place 0.4 mg under the tongue every 5 (five) minutes as needed for chest pain. If pain continues after 3 doses call 911      . ONE TOUCH ULTRA TEST test strip       . oxyCODONE (OXY IR/ROXICODONE) 5 MG immediate release tablet Take 5 mg by mouth every 4 (four) hours as needed for pain.      . pantoprazole (PROTONIX) 40 MG tablet Take 1 tablet (40 mg total) by mouth 2 (two) times daily.  60 tablet  0  . polyethylene glycol (MIRALAX / GLYCOLAX) packet Take 17 g by mouth daily as needed (for constipation).       . potassium chloride SA (K-DUR,KLOR-CON) 20 MEQ tablet Take 1 tablet (20 mEq total) by mouth daily.  30 tablet  3  . simvastatin (ZOCOR) 20 MG tablet Take 20 mg by mouth every evening.      . sucralfate (CARAFATE) 1 G tablet Take 1 g by mouth 5 (five) times daily.      . hydrALAZINE (APRESOLINE) 25 MG tablet Take 0.5 tablets (12.5 mg total) by mouth 3 (three) times daily.  270 tablet  3  . spironolactone (ALDACTONE) 25 MG tablet Take 1 tablet (25 mg total) by mouth daily.  90 tablet  3   No current facility-administered medications for this visit.    BP 130/70  Pulse 81  Ht 5\' 7"  (1.702 m)  Wt 179 lb (81.194 kg)  BMI 28.03 kg/m2  SpO2 97% General: NAD Neck: JVP 8 cm, no thyromegaly or thyroid nodule.  Lungs: Clear to auscultation bilaterally with normal respiratory effort. CV: Nondisplaced PMI.  Heart regular S1/S2, no S3/S4, no murmur.  1+ edema 1/3 up lower legs bilaterally.  No carotid bruit.  Normal pedal pulses.  Abdomen: Soft, nontender, no hepatosplenomegaly, distended with fluid wave.  Neurologic: Alert and oriented x 3.  Psych: Normal affect. Extremities: No clubbing or cyanosis.   Assessment/Plan: 1. CHF: Chronic systolic CHF, NYHA class III symptoms.  EF 37% with wall motion abnormalities on cardiac MRI (echo was difficult study).  JVP is at most mildly elevated today. - Continue Lasix at 80 mg  bid.  - Will need BMET and BNP in 1 week. - Continue Imdur and restart hydralazine at a lower dose - 12.5 mg bid - since BP has been running on the low side at times (not on ACEI at this time with elevated creatinine).  - Continue current Coreg.  2. Cirrhosis: Suspect NASH.  Workup negative for viral hepatitis and never a drinker.  He has ascites on exam again and abdomen is distended and uncomfortable. - Will arrange for therapeutic paracentesis by IR.   - He has GI followup. - Will carefully add spironolactone to try to improve ascites removal.  I will decrease KCl to 20 mEq once daily and will check BMET next week.   3. Anemia: Suspect upper GI bleeding from GAVE.  Had APC in hospital.  CBC has been stable.  Will followup with GI.  4. CKD: Suspect there is a cardiorenal component.  Will have to follow creatinine carefully with diuresis.  I will arrange followup with nephrology.  5. CAD: No chest pain.  At this point, I do not think there is indication for functional study. He would not be a good cath candidate given his creatinine.  I have stopped ranolazine given his low GFR (he has been started on Imdur so hopefully will not have angina).  Continue ASA, statin.  6. Hyperlipidemia: Will check lipids with next labs.  7. H/o CEA: Will get carotid dopplers.     Marca Ancona 09/26/2012

## 2012-09-26 NOTE — Telephone Encounter (Signed)
Spoke with pt and he is aware of Dr Alford Highland recommendations for gout

## 2012-09-26 NOTE — Telephone Encounter (Signed)
Per Dr Almon Hercules should avoid NSAIDS.

## 2012-09-26 NOTE — Telephone Encounter (Signed)
Pt called complaining of abdominal discomfort after taking his first dose of spironolactone, which was started today.  I told him to hold his dose for tomorrow, and that we would touch base with him tomorrow to recommend an alternative med.

## 2012-09-26 NOTE — Telephone Encounter (Signed)
Pt states he developed pain in his toes last night. He has been treated for gout in the past and these symptoms are similar to past symptoms when he had gout. Marland Kitchen Per Dr Almon Hercules can take colchicine 0.6mg  bid for 1 day, then daily until pain has resolved.

## 2012-09-26 NOTE — Telephone Encounter (Signed)
New Prob   Pt is experiencing gout again and has been in some pain. Would like to know if he can go back on medication for gout. Would like to speak to nurse.

## 2012-09-27 ENCOUNTER — Telehealth: Payer: Self-pay | Admitting: *Deleted

## 2012-09-27 NOTE — Telephone Encounter (Signed)
[name]     FW: CKA    [name]     Sent: Wed September 27, 2012 11:19 AM  [name]  MRN: [id number] DOB: 07-Sep-1945    Pt Work: [phone] Pt Home: [phone]           Message    Schedule for 10-23-12 @ 10:30  With Lowell Guitar

## 2012-09-28 ENCOUNTER — Ambulatory Visit: Payer: No Typology Code available for payment source | Admitting: Internal Medicine

## 2012-09-29 ENCOUNTER — Telehealth: Payer: Self-pay | Admitting: Cardiology

## 2012-09-29 NOTE — Telephone Encounter (Signed)
New Problem:     Patient called in wanting to stop one of his medications because his BP is still low.  Please call back.

## 2012-09-29 NOTE — Telephone Encounter (Signed)
Called patient back. He reports to [place] that his BP yesterday was 110/62 with HR of 80 and today the BP is 88/50 with HR of 80. No complaints of dizziness. Readings were done by home care nurse. Patient states that he is going to hold the Hydralazine for the next few days because of low BP. (Was stopped in the hospital and then restarted back this week after office visit.) Advised him to call back on Monday 4/14 with BP reading after the home nurse visit. Patient verbalized understanding.

## 2012-10-03 NOTE — Telephone Encounter (Signed)
Spoke with patient who was told to call back yesterday and report weight and BP.  Patient states he has gained 1 lb (today weighs 176, weighed 175 yesterday) and that his BP is 117/71.  Patient states that he is feeling better and will continue to monitor.  Patient has appointment with Tereso Newcomer, PA-C on 4/17.  Patient states he is taking his medications as directed and verbalized agreement to call us if he has additional concerns before Thursday.

## 2012-10-03 NOTE — Telephone Encounter (Signed)
LMTCB

## 2012-10-03 NOTE — Telephone Encounter (Signed)
New problem   Pt stated he gaining weight and he need to talk to a nurse concerning this matter. Please call pt.

## 2012-10-03 NOTE — Telephone Encounter (Signed)
Follow-up:    Patient returned your call.  Please call back. 

## 2012-10-04 ENCOUNTER — Telehealth: Payer: Self-pay | Admitting: Cardiology

## 2012-10-04 NOTE — Telephone Encounter (Signed)
Needs BMET.  Needs appt with GI for cirrhosis management.  Probably does not need to go to the ER.  Can arrange paracentesis again if he thinks his abdomen is enlarging again and weight is going up. Would start setting this up prior to appt with Lorin Picket and then [place] can determine if it is definitely needed.

## 2012-10-04 NOTE — Telephone Encounter (Signed)
New problem    Pt has fluid around stomach

## 2012-10-04 NOTE — Telephone Encounter (Signed)
Pt clarified he has a hx of the same issue, had fluid drawn off in the past via [place], denies SOB, noted 4-5 days of fluid around the right side, denies chest pain, no problems urinating, also notes right leg is also swelling, edema in right leg reduces at night with propping up leg,pt wants to advise Tereso Newcomer prior to his apt tomorrow that he wants the fluid to come off, normally performed at [facility], pt denies any further weight gain since yesterday, still states he has gained 4-5 pounds in one week, (see phone note from 09-29-12)pt advised that Alben Spittle is off this afternoon and that he will review the note in the morning prior to his office visit to make any changes to medications per offered to send note to another physician per Shirlee Latch not in office, pt declined offer to send concern to any other doctors for review, reiterated to pt the importance to be evaluated in the ED if his current sxs worsen, pt understood. Will forward message to [facility].

## 2012-10-05 ENCOUNTER — Ambulatory Visit (INDEPENDENT_AMBULATORY_CARE_PROVIDER_SITE_OTHER): Payer: PRIVATE HEALTH INSURANCE | Admitting: *Deleted

## 2012-10-05 ENCOUNTER — Ambulatory Visit (INDEPENDENT_AMBULATORY_CARE_PROVIDER_SITE_OTHER): Payer: PRIVATE HEALTH INSURANCE | Admitting: Physician Assistant

## 2012-10-05 ENCOUNTER — Encounter: Payer: Self-pay | Admitting: Physician Assistant

## 2012-10-05 ENCOUNTER — Encounter (INDEPENDENT_AMBULATORY_CARE_PROVIDER_SITE_OTHER): Payer: PRIVATE HEALTH INSURANCE

## 2012-10-05 VITALS — BP 118/68 | HR 79 | Ht 67.0 in | Wt 177.8 lb

## 2012-10-05 DIAGNOSIS — I6529 Occlusion and stenosis of unspecified carotid artery: Secondary | ICD-10-CM

## 2012-10-05 DIAGNOSIS — I2510 Atherosclerotic heart disease of native coronary artery without angina pectoris: Secondary | ICD-10-CM

## 2012-10-05 DIAGNOSIS — E785 Hyperlipidemia, unspecified: Secondary | ICD-10-CM

## 2012-10-05 DIAGNOSIS — N185 Chronic kidney disease, stage 5: Secondary | ICD-10-CM

## 2012-10-05 DIAGNOSIS — I658 Occlusion and stenosis of other precerebral arteries: Secondary | ICD-10-CM

## 2012-10-05 DIAGNOSIS — D649 Anemia, unspecified: Secondary | ICD-10-CM

## 2012-10-05 DIAGNOSIS — I10 Essential (primary) hypertension: Secondary | ICD-10-CM

## 2012-10-05 DIAGNOSIS — R188 Other ascites: Secondary | ICD-10-CM

## 2012-10-05 DIAGNOSIS — K7460 Unspecified cirrhosis of liver: Secondary | ICD-10-CM

## 2012-10-05 DIAGNOSIS — I5022 Chronic systolic (congestive) heart failure: Secondary | ICD-10-CM

## 2012-10-05 LAB — BASIC METABOLIC PANEL
BUN: 27 mg/dL — ABNORMAL HIGH (ref 6–23)
Calcium: 8.3 mg/dL — ABNORMAL LOW (ref 8.4–10.5)
Creatinine, Ser: 1.8 mg/dL — ABNORMAL HIGH (ref 0.4–1.5)
GFR: 41.4 mL/min — ABNORMAL LOW (ref >60.00)

## 2012-10-05 LAB — BRAIN NATRIURETIC PEPTIDE: Pro B Natriuretic peptide (BNP): 875 pg/mL — ABNORMAL HIGH (ref 0.0–100.0)

## 2012-10-05 NOTE — Patient Instructions (Addendum)
YOU HAVE AN APPT. WITH DR. PYRTLE 10/27/12 @ 1:30 PLEASE ARRIVE 15 MINUTES EARLY FOR PAPERWORK  LAB TODAY; BMET  YOU ARE SCHEDULED AT [facility] 10/06/12 @ 10:45 FOR A PARACENTESIS TO BE PERFORMED.  TODAY AND TOMORROW MORNING YOU ARE TO TAKE 120 MG OF LASIX AS WELL AS AN EXTRA POTASSIUM TONIGHT.  STARTING TOMORROW AFTERNOON YOU WILL RESUME THE LASIX 80 MG TWICE DAILY AND YOUR REGULAR DOSE OF POTASSIUM   YOU HAVE A FOLLOW UP APPT WITH [place] 10/16/12 @ 3 PM

## 2012-10-05 NOTE — Progress Notes (Signed)
[address], [address] [place], [place]  [address] Phone: [phone] Fax:  [phone]  Date:  10/05/2012   ID:  [name], [name] Feb 10, 1946, MRN [id number]  PCP:  Dina Rich, MD  Primary Cardiologist:  Dr. Marca Ancona     History of Present Illness: [name] is a 67 y.o. male who returns for f/u on CHF.  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  He was initially seen by cardiology in the hospital 3/14. He was admitted with a/c systolic CHF. Cardiac MRI showed EF 37% (echo was difficult study). He was started on milrinone and diuresed while in the hospital. RHC on milrinone showed relatively compensated filling pressures and he was converted over to po Lasix. He was noted to have significant ascites and had a therapeutic paracentesis while in the hospital. Imaging showed cirrhosis. Hepatitis panel and AFP were negative. He also was anemic and was followed by GI with EGD and APC of GAVE.  Dr. Shirlee Latch saw him on 3/26, and he had re-developed significant abdominal swelling and he was sent for a 2nd US-guided paracentesis which was done on 3/28 with removal of 4.3 L ascites. On 3/30, he was re-admitted with ongoing dyspnea and RUQ pain. He had a workup for cholecystitis which showed cholelithiasis but not cholecystitis. He was diuresed a small amount and discharged home. Cardiology was not consulted this admission.  He saw Dr. Shirlee Latch last on 09/26/12.  Breathing was stable.  Hydralazine was added back at low dose.  He was noted to have recurrent ascites and he was referred for another paracentesis.  3rd U/S guided paracentesis was done 09/25/12 yielding 4L of ascitic fluid.  Dr. Shirlee Latch did add Spironolactone with plans for f/u BMET today.  Of note, he sees GI next month.  He was also referred to nephrology.  Of note, f/u carotid dopplers were done today.    Since last seen, he has noted an increase in his  abdominal girth. He feels that he is "full of fluid" again. He is requesting another paracentesis. We have already scheduled this for tomorrow.  He feels that his dyspnea is unchanged. He probably describes NYHA class IIb-3 symptoms. He sleeps on 2-3 pillows chronically without significant change recently. He denies PND.  LE edema seems to be fairly stable. He denies chest pain or syncope.   Labs (3/14): K 4, creatinine 2.3, HCT 25.7  Labs (4/14): K 3.2, creatinine 1.97, LFTs normal, albumin 2.1, HCT 25.9    Wt Readings from Last 3 Encounters:  10/05/12 177 lb 12.8 oz (80.65 kg)  09/25/12 179 lb (81.194 kg)  09/21/12 173 lb (78.472 kg)     Past Medical History:  1. Type II diabetes  2. HTN  3. Hyperlipidemia  4. CAD:  - CABG 1999  - PCI 2007 to SVG-OM  - Last cath 2008 showed patent LIMA-LAD, SVG-OM, and SVG-PDA. Patient had PTCA to PLV at that time.  5. Systolic CHF: Echo in 2009 showed EF 50-55%. ? EF 40% on echo subsequently in [place]. Echo in 3/14 at [facility] was a very difficult study. Cardiac MRI (without contrast given elevated creatinine) showed EF 37% with mid-apical anterior, anteroseptal, and inferior moderate to severe hypokinesis. RHC (3/14) with mean RA 7, PA 31/19, mean PCWP 21, CI 3.66 (on milrinone).  6. Obesity  7. CKD  8. Cirrhosis: Possibly due to NASH. No h/o ETOH. Hepatitis panel  and AFP negative in 3/14. He has ascites with prior paracentesis.  9. GAVE with anemia: EGD with APC in 3/14. He is on PPI.  10. CKD  11. H/o CEA   Current Outpatient Prescriptions  Medication Sig Dispense Refill  . aspirin EC 81 MG tablet Take 81 mg by mouth daily.      . B Complex-C-Folic Acid (B COMPLEX-VITAMIN C-FOLIC ACID) 1 MG tablet Take 1 tablet by mouth daily.       . carvedilol (COREG) 6.25 MG tablet Take 1 tablet (6.25 mg total) by mouth 2 (two) times daily.  60 tablet  3  . cholecalciferol (VITAMIN D) 1000 UNITS tablet Take 1,000 Units by mouth daily.      . colchicine 0.6 MG  tablet 1 two times a day for one day, then 1 daily until toe pain has resolved.  15 tablet  0  . Cyanocobalamin (VITAMIN B-12 PO) Take 1 tablet by mouth daily.      Marland Kitchen docusate sodium (COLACE) 100 MG capsule Take 100 mg by mouth at bedtime.      . ferrous sulfate 325 (65 FE) MG tablet Take 325 mg by mouth daily.       . furosemide (LASIX) 80 MG tablet Take 1 tablet (80 mg total) by mouth 2 (two) times daily.  60 tablet  6  . insulin glargine (LANTUS) 100 UNIT/ML injection Inject 10 Units into the skin every morning.       . isosorbide mononitrate (IMDUR) 30 MG 24 hr tablet Take 1 tablet (30 mg total) by mouth daily.  30 tablet  0  . Multiple Vitamin (MULITIVITAMIN WITH MINERALS) TABS Take 1 tablet by mouth daily.      . nitroGLYCERIN (NITROSTAT) 0.4 MG SL tablet Place 0.4 mg under the tongue every 5 (five) minutes as needed for chest pain. If pain continues after 3 doses call 911      . ONE TOUCH ULTRA TEST test strip       . oxyCODONE (OXY IR/ROXICODONE) 5 MG immediate release tablet Take 5 mg by mouth every 4 (four) hours as needed for pain.      . pantoprazole (PROTONIX) 40 MG tablet Take 1 tablet (40 mg total) by mouth 2 (two) times daily.  60 tablet  0  . polyethylene glycol (MIRALAX / GLYCOLAX) packet Take 17 g by mouth daily as needed (for constipation).       . potassium chloride SA (K-DUR,KLOR-CON) 20 MEQ tablet Take 1 tablet (20 mEq total) by mouth daily.  30 tablet  3  . simvastatin (ZOCOR) 20 MG tablet Take 20 mg by mouth every evening.      Marland Kitchen spironolactone (ALDACTONE) 25 MG tablet Take 1 tablet (25 mg total) by mouth daily.  90 tablet  3  . sucralfate (CARAFATE) 1 G tablet Take 1 g by mouth 5 (five) times daily.       No current facility-administered medications for this visit.    Allergies:    Allergies  Allergen Reactions  . Morphine And Related Other (See Comments)    Shakes & hallucinations    Social History:  The patient  reports that he has never smoked. He does not  have any smokeless tobacco history on file. He reports that he does not drink alcohol or use illicit drugs.   ROS:  Please see the history of present illness.   He has a non-productive cough.  No hemoptysis.   All other systems reviewed and negative.  PHYSICAL EXAM: VS:  BP 118/68  Pulse 79  Ht 5\' 7"  (1.702 m)  Wt 177 lb 12.8 oz (80.65 kg)  BMI 27.84 kg/m2 Well nourished, well developed, in no acute distress HEENT: normal Neck: no JVD at 90 Cardiac:  normal S1, S2; RRR; no murmur Lungs:  Decreased breath sounds bilaterally, no wheezing, rhonchi or rales Abd: distended Ext: 1-2+ bilateral LE edema, right greater than left Skin: warm and dry Neuro:  CNs 2-12 intact, no focal abnormalities noted  EKG:  NSR, HR 79, normal axis, first degree AV block, poor R wave progression, nonspecific ST-T wave changes, no change from prior tracing     ASSESSMENT AND PLAN:  1. Chronic Systolic CHF:  He is unable to lay up on the exam table today. I do not think his JVD is elevated at 90. He does have worsening ascites.  I will increase his Lasix to 120 mg this PM and again tomorrow AM.  Take one extra tab of K+ 20 mEq.   2. Cirrhosis:  As noted, he has worsening ascites.  Plan on U/S guided paracentesis with IR again tomorrow.  He sees GI in several weeks.  If creatinine and K+ stable, consider increasing spironolactone to 50 mg QD. 3. CKD:  F/u BMET pending from today.  4. CAD:  No angina.  Continue ASA and statin.   5. Carotid Stenosis, s/p CEA:  F/u dopplers done today. 6. Hyperlipidemia:  Continue statin.    7. Disposition:  F/u with me in 2 weeks and keep f/u with Dr. Marca Ancona in May.  Signed, Tereso Newcomer, PA-C  2:45 PM 10/05/2012

## 2012-10-06 ENCOUNTER — Ambulatory Visit (HOSPITAL_COMMUNITY)
Admission: RE | Admit: 2012-10-06 | Discharge: 2012-10-06 | Disposition: A | Discharge status: Home / Self Care | Payer: PRIVATE HEALTH INSURANCE | Source: Ambulatory Visit | Attending: Cardiology | Admitting: Cardiology

## 2012-10-06 DIAGNOSIS — R188 Other ascites: Secondary | ICD-10-CM

## 2012-10-06 NOTE — Procedures (Signed)
Successful US guided paracentesis from RLQ.  Yielded 3.5L of clear yellow fluid.  No immediate complications.  Pt tolerated well.   Specimen was not sent for labs.  Brayton El PA-C 10/06/2012 12:33 PM

## 2012-10-09 NOTE — Telephone Encounter (Signed)
See OV note 10/05/12 Tereso Newcomer, PA-C  8:54 PM 10/09/2012

## 2012-10-10 ENCOUNTER — Telehealth: Payer: Self-pay | Admitting: Cardiology

## 2012-10-10 NOTE — Telephone Encounter (Signed)
New problem   Pt want to know if he can take an enemia. Please call pt

## 2012-10-10 NOTE — Telephone Encounter (Signed)
Spoke with patient.

## 2012-10-10 NOTE — Telephone Encounter (Signed)
Reviewed with Dr Shirlee Latch. Pt should avoid using enemas--he can use colace.

## 2012-10-10 NOTE — Telephone Encounter (Signed)
Pt can also use dulcolax per Dr Shirlee Latch. LMTCB for pt.

## 2012-10-10 NOTE — Telephone Encounter (Signed)
New problem ° ° °Per pt returning your call °

## 2012-10-10 NOTE — Telephone Encounter (Signed)
See phone note 09/29/12

## 2012-10-12 ENCOUNTER — Telehealth: Payer: Self-pay | Admitting: Nurse Practitioner

## 2012-10-12 NOTE — Telephone Encounter (Signed)
Pt called in this evening.  She was feeling weak earlier and her pcp had home health draw a cbc.  This apparently came back with a hgb of 8.1 and she was advised to inform us.  Her last hgb in Epic was 8.2 earlier this month.  She is no longer feeling weak and just wanted to make sure that her hgb was stable.  I advised that it was.

## 2012-10-15 ENCOUNTER — Telehealth: Payer: Self-pay | Admitting: Physician Assistant

## 2012-10-15 NOTE — Telephone Encounter (Signed)
Pt called this evening with concerns of his BP being too low and requesting whether to take his evening BP meds. He reports readings of 108-113/60-70 this evening. Feels well, just a little tired. Reassured that these are good BP readings. BP in the office earlier this month was 118/68. Stated this seems to be his normal range. He takes Coreg 6.25 mg BID. Advised it was okay to take this as scheduled this evening. He asked about taking 1/2 tab tonight, stated this would be okay also. He understood and agreed.   Jacqulyn Bath, PA-C 10/15/2012 9:05 PM

## 2012-10-16 ENCOUNTER — Encounter: Payer: Self-pay | Admitting: Physician Assistant

## 2012-10-16 ENCOUNTER — Ambulatory Visit: Payer: No Typology Code available for payment source | Admitting: Cardiology

## 2012-10-16 ENCOUNTER — Ambulatory Visit (INDEPENDENT_AMBULATORY_CARE_PROVIDER_SITE_OTHER): Payer: PRIVATE HEALTH INSURANCE | Admitting: Physician Assistant

## 2012-10-16 ENCOUNTER — Telehealth: Payer: Self-pay | Admitting: Cardiology

## 2012-10-16 VITALS — BP 106/63 | HR 76 | Ht 67.0 in | Wt 176.0 lb

## 2012-10-16 DIAGNOSIS — R188 Other ascites: Secondary | ICD-10-CM

## 2012-10-16 DIAGNOSIS — K7460 Unspecified cirrhosis of liver: Secondary | ICD-10-CM

## 2012-10-16 DIAGNOSIS — E785 Hyperlipidemia, unspecified: Secondary | ICD-10-CM

## 2012-10-16 DIAGNOSIS — I509 Heart failure, unspecified: Secondary | ICD-10-CM

## 2012-10-16 DIAGNOSIS — R9431 Abnormal electrocardiogram [ECG] [EKG]: Secondary | ICD-10-CM

## 2012-10-16 DIAGNOSIS — I5022 Chronic systolic (congestive) heart failure: Secondary | ICD-10-CM

## 2012-10-16 DIAGNOSIS — I2510 Atherosclerotic heart disease of native coronary artery without angina pectoris: Secondary | ICD-10-CM

## 2012-10-16 MED ORDER — SPIRONOLACTONE 25 MG PO TABS: 50.0000 mg | ORAL_TABLET | Freq: Every day | ORAL | Status: DC

## 2012-10-16 NOTE — Patient Instructions (Addendum)
INCREASE SPIRONOLACTONE 50 MG DAILY  STOP POTASSIUM  KEEP YOUR APPT WITH DR. Margretta Sidle 10/27/12  KEEP YOU APPT WITH DR. Shirlee Latch 11/16/12

## 2012-10-16 NOTE — Progress Notes (Signed)
[address] [place], [place]  [address] Phone: [phone] Fax:  [phone]  Date:  10/16/2012   ID:  [name], [name] Sep 21, 1945, MRN [id number]  PCP:  Dina Rich, MD  Primary Cardiologist:  Dr. Marca Ancona     History of Present Illness: [name] is a 67 y.o. male who returns for f/u on CHF.  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  He was initially seen by Dr. Shirlee Latch in the hospital 3/14. He was admitted with a/c systolic CHF. Cardiac MRI showed EF 37% (echo was difficult study). He was started on milrinone and diuresed while in the hospital. RHC on milrinone showed relatively compensated filling pressures and he was converted over to po Lasix. He was noted to have significant ascites and had a therapeutic paracentesis while in the hospital. Imaging showed cirrhosis. Hepatitis panel and AFP were negative. He also was anemic and was followed by GI with EGD and APC of GAVE.  Dr. Shirlee Latch saw him on 3/26, and he had re-developed significant abdominal swelling and he was sent for a 2nd US-guided paracentesis which was done on 3/28 with removal of 4.3 L ascites. On 3/30, he was re-admitted with ongoing dyspnea and RUQ pain. He had a workup for cholecystitis which showed cholelithiasis but not cholecystitis. He was diuresed a small amount and discharged home. Cardiology was not consulted this admission.  He saw Dr. Shirlee Latch last on 09/26/12.  Breathing was stable.  Hydralazine was added back at low dose.  He was noted to have recurrent ascites and he was referred for another paracentesis.  3rd U/S guided paracentesis was done 09/25/12 yielding 4L of ascitic fluid.  Dr. Shirlee Latch did add Spironolactone with plans for f/u BMET today.  Of note, he sees GI next month.  He was also referred to nephrology and sees them next week.     I saw him 4/17 and he had increased ascites.  We sent him for a 4th U/S  guided paracentesis and he had 3.5 L drained.  Since then, he feels like his abdomen is distended again.  He notes increased DOE.  Sleeps on 3-4 pillows chronically.  No PND.  No chest pain.  No syncope.  He notes non-productive cough.    Labs (3/14): K 4, creatinine 2.3, HCT 25.7  Labs (4/14): K 3.2, creatinine 1.97=>1.8, LFTs normal, albumin 2.1, HCT 25.9, BNP 875  Wt Readings from Last 3 Encounters:  10/16/12 176 lb (79.833 kg)  10/05/12 177 lb 12.8 oz (80.65 kg)  09/25/12 179 lb (81.194 kg)     Past Medical History:  1. Type II diabetes  2. HTN  3. Hyperlipidemia  4. CAD:  - CABG 1999  - PCI 2007 to SVG-OM  - Last cath 2008 showed patent LIMA-LAD, SVG-OM, and SVG-PDA. Patient had PTCA to PLV at that time.  5. Systolic CHF: Echo in 2009 showed EF 50-55%. ? EF 40% on echo subsequently in [place]. Echo in 3/14 at [facility] was a very difficult study. Cardiac MRI (without contrast given elevated creatinine) showed EF 37% with mid-apical anterior, anteroseptal, and inferior moderate to severe hypokinesis. RHC (3/14) with mean RA 7, PA 31/19, mean PCWP 21, CI 3.66 (on milrinone).  6. Obesity  7. CKD  8. Cirrhosis: Possibly due to NASH. No h/o ETOH. Hepatitis panel and AFP negative in 3/14. He has ascites with prior paracentesis.  9. GAVE  with anemia: EGD with APC in 3/14. He is on PPI.  10. CKD  11. H/o CEA; Dopplers 4/14: 40-59% RICA.   Current Outpatient Prescriptions  Medication Sig Dispense Refill  . aspirin EC 81 MG tablet Take 81 mg by mouth daily.      . B Complex-C-Folic Acid (B COMPLEX-VITAMIN C-FOLIC ACID) 1 MG tablet Take 1 tablet by mouth daily.       . carvedilol (COREG) 6.25 MG tablet Take 1 tablet (6.25 mg total) by mouth 2 (two) times daily.  60 tablet  3  . cholecalciferol (VITAMIN D) 1000 UNITS tablet Take 1,000 Units by mouth daily.      . colchicine 0.6 MG tablet 1 two times a day for one day, then 1 daily until toe pain has resolved.  15 tablet  0  . Cyanocobalamin  (VITAMIN B-12 PO) Take 1 tablet by mouth daily.      Marland Kitchen docusate sodium (COLACE) 100 MG capsule Take 100 mg by mouth at bedtime.      . ferrous sulfate 325 (65 FE) MG tablet Take 325 mg by mouth daily.       . furosemide (LASIX) 80 MG tablet Take 1 tablet (80 mg total) by mouth 2 (two) times daily.  60 tablet  6  . insulin glargine (LANTUS) 100 UNIT/ML injection Inject 10 Units into the skin every morning.       . isosorbide mononitrate (IMDUR) 30 MG 24 hr tablet Take 1 tablet (30 mg total) by mouth daily.  30 tablet  0  . Multiple Vitamin (MULITIVITAMIN WITH MINERALS) TABS Take 1 tablet by mouth daily.      . nitroGLYCERIN (NITROSTAT) 0.4 MG SL tablet Place 0.4 mg under the tongue every 5 (five) minutes as needed for chest pain. If pain continues after 3 doses call 911      . ONE TOUCH ULTRA TEST test strip       . oxyCODONE (OXY IR/ROXICODONE) 5 MG immediate release tablet Take 5 mg by mouth every 4 (four) hours as needed for pain.      . pantoprazole (PROTONIX) 40 MG tablet Take 1 tablet (40 mg total) by mouth 2 (two) times daily.  60 tablet  0  . polyethylene glycol (MIRALAX / GLYCOLAX) packet Take 17 g by mouth daily as needed (for constipation).       . potassium chloride SA (K-DUR,KLOR-CON) 20 MEQ tablet Take 1 tablet (20 mEq total) by mouth daily.  30 tablet  3  . promethazine (PHENERGAN) 25 MG tablet As needed      . simvastatin (ZOCOR) 20 MG tablet Take 20 mg by mouth every evening.      Marland Kitchen spironolactone (ALDACTONE) 25 MG tablet Take 1 tablet (25 mg total) by mouth daily.  90 tablet  3  . sucralfate (CARAFATE) 1 G tablet Take 1 g by mouth 5 (five) times daily.       No current facility-administered medications for this visit.    Allergies:    Allergies  Allergen Reactions  . Morphine And Related Other (See Comments)    Shakes & hallucinations    Social History:  The patient  reports that he has never smoked. He does not have any smokeless tobacco history on file. He reports that  he does not drink alcohol or use illicit drugs.   ROS:  Please see the history of present illness.   All other systems reviewed and negative.   PHYSICAL EXAM: VS:  BP 106/63  Pulse 76  Ht 5\' 7"  (1.702 m)  Wt 176 lb (79.833 kg)  BMI 27.56 kg/m2 Well nourished, well developed, in no acute distress HEENT: normal Neck: no JVD at 90 Cardiac:  normal S1, S2; RRR; no murmur Lungs:  Decreased breath sounds bilaterally, no wheezing, rhonchi or rales Abd: distended Ext: trace bilateral LE edema, right greater than left Skin: warm and dry Neuro:  CNs 2-12 intact, no focal abnormalities noted  EKG:  NSR, HR 76, normal axis, first degree AV block, poor R wave progression, nonspecific ST-T wave changes, no change from prior tracing     ASSESSMENT AND PLAN:  1. Chronic Systolic CHF:  He does have worsening ascites, again.  I do not think increasing his Lasix will help any.  He could not tolerate Hydralazine due to low BP.  D/w Dr. Shirlee Latch.  Will increase his Spironolactone to 50 mg QD.  D/c K+.  Check BMET in 5 days. 2. Cirrhosis:  As noted, he has worsening ascites.  Plan on U/S guided paracentesis with IR again tomorrow.  He sees GI next week.    3. CKD:  F/u BMET as planned.  He sees nephrology next week.  4. CAD:  No angina.  Continue ASA and statin.   5. Carotid Stenosis, s/p CEA:  Recent dopplers stable.  F/u planned in 1 year. 6. Hyperlipidemia:  Continue statin.    7. Disposition:  F/u with Dr. Marca Ancona in May as planned.  Luna Glasgow, PA-C  4:27 PM 10/16/2012

## 2012-10-16 NOTE — Telephone Encounter (Deleted)
New Problem: ° ° ° °Patient called in wanting to speak with you.  Please call back. °

## 2012-10-17 ENCOUNTER — Ambulatory Visit (HOSPITAL_COMMUNITY)
Admission: RE | Admit: 2012-10-17 | Discharge: 2012-10-17 | Disposition: A | Discharge status: Home / Self Care | Payer: PRIVATE HEALTH INSURANCE | Source: Ambulatory Visit | Attending: Physician Assistant | Admitting: Physician Assistant

## 2012-10-17 VITALS — BP 100/56

## 2012-10-17 DIAGNOSIS — R188 Other ascites: Secondary | ICD-10-CM | POA: Insufficient documentation

## 2012-10-17 DIAGNOSIS — N189 Chronic kidney disease, unspecified: Secondary | ICD-10-CM | POA: Insufficient documentation

## 2012-10-17 DIAGNOSIS — I509 Heart failure, unspecified: Secondary | ICD-10-CM | POA: Insufficient documentation

## 2012-10-17 NOTE — Procedures (Signed)
US guided therapeutic paracentesis performed yielding 3 liters yellow fluid. No immediate complications. 

## 2012-10-20 ENCOUNTER — Encounter: Payer: Self-pay | Admitting: Cardiology

## 2012-10-23 ENCOUNTER — Encounter: Payer: Self-pay | Admitting: Cardiology

## 2012-10-24 ENCOUNTER — Encounter: Payer: Self-pay | Admitting: Internal Medicine

## 2012-10-24 NOTE — Telephone Encounter (Signed)
Per Dr Wilmon Pali to order paracentesis. Pt is aware that I will ask [facility] to call him tomorrow to arrange paracentesis.

## 2012-10-24 NOTE — Telephone Encounter (Signed)
Pt states his abdomen is distended, about the same as when he saw Tereso Newcomer last week.  He states he has only gained about 2 pounds, but he needs to have fluid removed from his abdomen. I will review with Dr Shirlee Latch.

## 2012-10-24 NOTE — Telephone Encounter (Signed)
New problem   Pt want to talk to a nurse concerning the fluid that he has around his stomach. Please call pt

## 2012-10-25 ENCOUNTER — Ambulatory Visit (HOSPITAL_COMMUNITY)
Admission: RE | Admit: 2012-10-25 | Discharge: 2012-10-25 | Disposition: A | Discharge status: Home / Self Care | Payer: PRIVATE HEALTH INSURANCE | Source: Ambulatory Visit | Attending: Cardiology | Admitting: Cardiology

## 2012-10-25 VITALS — BP 116/62

## 2012-10-25 DIAGNOSIS — I5022 Chronic systolic (congestive) heart failure: Secondary | ICD-10-CM

## 2012-10-25 DIAGNOSIS — I509 Heart failure, unspecified: Secondary | ICD-10-CM | POA: Insufficient documentation

## 2012-10-25 DIAGNOSIS — R188 Other ascites: Secondary | ICD-10-CM | POA: Insufficient documentation

## 2012-10-25 DIAGNOSIS — N189 Chronic kidney disease, unspecified: Secondary | ICD-10-CM | POA: Insufficient documentation

## 2012-10-25 DIAGNOSIS — K7460 Unspecified cirrhosis of liver: Secondary | ICD-10-CM | POA: Insufficient documentation

## 2012-10-25 NOTE — Procedures (Signed)
US guided therapeutic paracentesis performed yielding 2 liters clear, yellow fluid. No immediate complications.

## 2012-10-27 ENCOUNTER — Other Ambulatory Visit (INDEPENDENT_AMBULATORY_CARE_PROVIDER_SITE_OTHER): Payer: PRIVATE HEALTH INSURANCE

## 2012-10-27 ENCOUNTER — Encounter: Payer: Self-pay | Admitting: Internal Medicine

## 2012-10-27 ENCOUNTER — Ambulatory Visit (INDEPENDENT_AMBULATORY_CARE_PROVIDER_SITE_OTHER): Payer: PRIVATE HEALTH INSURANCE | Admitting: Internal Medicine

## 2012-10-27 VITALS — BP 104/58 | HR 84 | Ht 64.25 in | Wt 168.1 lb

## 2012-10-27 DIAGNOSIS — K31819 Angiodysplasia of stomach and duodenum without bleeding: Secondary | ICD-10-CM

## 2012-10-27 DIAGNOSIS — I509 Heart failure, unspecified: Secondary | ICD-10-CM

## 2012-10-27 DIAGNOSIS — R188 Other ascites: Secondary | ICD-10-CM

## 2012-10-27 DIAGNOSIS — K7460 Unspecified cirrhosis of liver: Secondary | ICD-10-CM

## 2012-10-27 DIAGNOSIS — D649 Anemia, unspecified: Secondary | ICD-10-CM

## 2012-10-27 LAB — CBC
HCT: 26.4 % — ABNORMAL LOW (ref 39.0–52.0)
Hemoglobin: 8.6 g/dL — ABNORMAL LOW (ref 13.0–17.0)
MCV: 81.9 fl (ref 78.0–100.0)
RDW: 17.9 % — ABNORMAL HIGH (ref 11.5–14.6)

## 2012-10-27 LAB — IBC PANEL: Iron: 18 ug/dL — ABNORMAL LOW (ref 42–165)

## 2012-10-27 NOTE — Patient Instructions (Addendum)
Your physician has requested that you go to the basement for the following lab work before leaving today: cbc, iron studies, B-12                                               We are excited to introduce MyChart, a new best-in-class service that provides you online access to important information in your electronic medical record. We want to make it easier for you to view your health information - all in one secure location - when and where you need it. We expect MyChart will enhance the quality of care and service we provide.  When you register for MyChart, you can:    View your test results.    Request appointments and receive appointment reminders via email.    Request medication renewals.    View your medical history, allergies, medications and immunizations.    Communicate with your physician's office through a password-protected site.    Conveniently print information such as your medication lists.  To find out if MyChart is right for you, please talk to a member of our clinical staff today. We will gladly answer your questions about this free health and wellness tool.  If you are age 28 or older and want a member of your family to have access to your record, you must provide written consent by completing a proxy form available at our office. Please speak to our clinical staff about guidelines regarding accounts for patients younger than age 76.  As you activate your MyChart account and need any technical assistance, please call the MyChart technical support line at [phone]) or email your question to [email or web address]. If you email your question(s), please include your name, a return phone number and the best time to reach you.  If you have non-urgent health-related questions, you can send a message to our office through MyChart at [email or web address]. If you have a medical emergency, call 911.  Thank you for using MyChart as your new health and  wellness resource!   MyChart licensed from [employer],  [id number]. Patents Pending.

## 2012-10-27 NOTE — Progress Notes (Signed)
Patient ID: [name], male   DOB: [birth year], 67 y.o.   MRN: [id number] HPI: [name] is a 67 y.o. 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  He is with his wife today. He overall feels better than when I last saw him during his hospitalization in March 2014. He's had no evidence of further bleeding including no bright red blood per rectum or melena. He has been having a hard time taking his oral iron to 2 worsening constipation. He's had approximately 3 paracenteses since his hospitalization in March. He is 4.3 L removed on 09/15/2012, 4 L removed 09/25/2012, and 2 L on 10/23/2012. He reports his ascites has acute reaccumulated very slowly compared to prior. He denies abdominal pain. He is eating well. He continues to be very fatigued. He denies chest pain or dyspnea today. Recently his prolactin was increased from 25-50 mg daily. He continues on Lasix 80 mg twice daily.  Labs (3/14): K 4, creatinine 2.3, HCT 25.7  Labs (4/14): K 3.2, creatinine 1.97=>1.8, LFTs normal, albumin 2.1, HCT 25.9, BNP 875   Patient Active Problem List   Diagnosis Date Noted  . Ascites 09/26/2012  . Hypokalemia 09/20/2012  . Gall stones 09/19/2012  . Shortness of breath 09/17/2012  . Chronic systolic CHF (congestive heart failure) 09/14/2012  . Cirrhosis 09/09/2012  . 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 Anemia 09/01/2012  . Renal failure (ARF), acute on chronic 09/01/2012  . Pleural effusion, right 09/01/2012  . Diabetes type 2, uncontrolled 09/01/2012  . HTN (hypertension) 09/01/2012  . Hyperlipidemia 09/01/2012  . CAD (coronary artery disease), S/P CABG & stents 09/01/2012  . Systolic CHF, acute on chronic 09/01/2012  . Chronic LBP 09/01/2012     Past Surgical History  Procedure Laterality Date  . Coronary artery bypass graft    . Carotid endarterectomy    . Cardiac catheterization    . Shoulder surgery    . Esophagogastroduodenoscopy N/A 09/07/2012    Procedure: ESOPHAGOGASTRODUODENOSCOPY (EGD);  Surgeon: Beverley Fiedler, MD;  Location: [facility] ENDOSCOPY;  Service: Gastroenterology;  Laterality: N/A;  . Esophagogastroduodenoscopy N/A 09/08/2012    Procedure: ESOPHAGOGASTRODUODENOSCOPY (EGD);  Surgeon: Beverley Fiedler, MD;  Location: [facility] ENDOSCOPY;  Service: Gastroenterology;  Laterality: N/A;    Current Outpatient Prescriptions  Medication Sig Dispense Refill  . aspirin EC 81 MG tablet Take 81 mg by mouth daily.      . B Complex-C-Folic Acid (B COMPLEX-VITAMIN C-FOLIC ACID) 1 MG tablet Take 1 tablet by mouth daily.       . carvedilol (COREG) 6.25 MG tablet Take 1 tablet (6.25 mg total) by mouth 2 (two) times daily.  60 tablet  3  . cholecalciferol (VITAMIN D) 1000 UNITS tablet Take 1,000 Units by mouth daily.      . colchicine 0.6 MG tablet 1 two times a day for one day, then 1 daily until toe pain has resolved.  15 tablet  0  . Cyanocobalamin (VITAMIN B-12 PO) Take 1 tablet by mouth daily.      Marland Kitchen docusate sodium (COLACE) 100 MG capsule Take 100 mg by mouth at bedtime.      . ferrous sulfate 325 (65 FE) MG tablet Take 325 mg by mouth daily.       . furosemide (  LASIX) 80 MG tablet Take 1 tablet (80 mg total) by mouth 2 (two) times daily.  60 tablet  6  . insulin glargine (LANTUS) 100 UNIT/ML injection Inject 10 Units into the skin every morning.       . isosorbide mononitrate (IMDUR) 30 MG 24 hr tablet Take 1 tablet (30 mg total) by mouth daily.  30 tablet  0  . Multiple Vitamin (MULITIVITAMIN WITH MINERALS) TABS Take 1 tablet by mouth daily.      . nitroGLYCERIN (NITROSTAT) 0.4 MG SL tablet Place 0.4 mg under the tongue every 5 (five) minutes as needed for chest pain. If pain continues after 3 doses call 911      . ONE TOUCH ULTRA TEST  test strip       . oxyCODONE (OXY IR/ROXICODONE) 5 MG immediate release tablet Take 5 mg by mouth every 4 (four) hours as needed for pain.      . pantoprazole (PROTONIX) 40 MG tablet Take 1 tablet (40 mg total) by mouth 2 (two) times daily.  60 tablet  0  . polyethylene glycol (MIRALAX / GLYCOLAX) packet Take 17 g by mouth daily as needed (for constipation).       . promethazine (PHENERGAN) 25 MG tablet As needed      . simvastatin (ZOCOR) 20 MG tablet Take 20 mg by mouth every evening.      Marland Kitchen spironolactone (ALDACTONE) 25 MG tablet Take 2 tablets (50 mg total) by mouth daily.  60 tablet  11  . sucralfate (CARAFATE) 1 G tablet Take 1 g by mouth 5 (five) times daily.       No current facility-administered medications for this visit.    Allergies  Allergen Reactions  . Morphine And Related Other (See Comments)    Shakes & hallucinations    Family History  Problem Relation Age of Onset  . Heart disease Father   . Heart disease Brother   . Diabetes Brother   . Kidney disease Brother     diabetes    History  Substance Use Topics  . Smoking status: Never Smoker   . Smokeless tobacco: Never Used  . Alcohol Use: No    ROS: As per history of present illness, otherwise negative  BP 104/58  Pulse 84  Ht 5' 4.25" (1.632 m)  Wt 168 lb 2 oz (76.261 kg)  BMI 28.63 kg/m2 Constitutional: Chronically ill-appearing male in no acute distress HEENT: Normocephalic and atraumatic. Oropharynx is clear and moist. No oropharyngeal exudate. Conjunctivae are normal. Edentulous No scleral icterus. Cardiovascular: Normal rate, regular rhythm and intact distal pulses.  Pulmonary/chest: Effort normal and breath sounds normal. No wheezing, rales or rhonchi. Abdominal: Soft, well-healed midline scar, liver edge palpable 3-4 cm below the right costal margin, probable low-volume ascites, nontender, nondistended. Bowel sounds active throughout.  Extremities: no clubbing, cyanosis, or edema Neurological:  Alert and oriented to person place and time. No asterixis Skin: Skin is warm and dry. No rashes noted. Psychiatric: Normal mood and affect. Behavior is normal.  RELEVANT LABS AND IMAGING: CBC    Component Value Date/Time   WBC 7.2 09/21/2012 0450   RBC 2.99* 09/21/2012 0450   HGB 8.2* 09/21/2012 0450   HCT 25.9* 09/21/2012 0450   PLT 266 09/21/2012 0450   MCV 86.6 09/21/2012 0450   MCH 27.4 09/21/2012 0450   MCHC 31.7 09/21/2012 0450   RDW 15.5 09/21/2012 0450   LYMPHSABS 2.1 09/17/2012 1822   MONOABS 0.9 09/17/2012 1822   EOSABS 0.1  09/17/2012 1822   BASOSABS 0.0 09/17/2012 1822    CMP     Component Value Date/Time   NA 137 10/05/2012 1410   K 4.5 10/05/2012 1410   CL 99 10/05/2012 1410   CO2 29 10/05/2012 1410   GLUCOSE 168* 10/05/2012 1410   BUN 27* 10/05/2012 1410   CREATININE 1.8* 10/05/2012 1410   CALCIUM 8.3* 10/05/2012 1410   PROT 5.5* 09/21/2012 0450   ALBUMIN 2.1* 09/21/2012 0450   AST 20 09/21/2012 0450   ALT 8 09/21/2012 0450   ALKPHOS 96 09/21/2012 0450   BILITOT 0.2* 09/21/2012 0450   GFRNONAA 34* 09/21/2012 0450   GFRAA 40* 09/21/2012 0450   CT ABDOMEN AND PELVIS WITHOUT CONTRAST   Technique:  Multidetector CT imaging of the abdomen and pelvis was performed following the standard protocol without intravenous contrast.   Comparison: Ultrasound 09/06/2012   Findings: There is a is moderate right effusion.  There is right basilar atelectasis and potential consolidation.  Left base is clear.  No pericardial fluid.   The liver has a shrunken nodular contour.  The liver cannot be adequately reassessed for mass without contrast.  No discrete mass identified.  There are gallstones in the gallbladder which is nondistended.  The pancreas is atrophic.  Spleen, adrenal glands, kidneys are normal.   Stomach, small bowel, colon grossly normal.   There is a moderate volume intraperitoneal free fluid seen along the pericolic gutters and surrounding the liver and spleen.   There is a moderate  volume of free fluid the pelvis.  Post hysterectomy anatomy.  Bladder appears normal. Review of  bone windows demonstrates no aggressive osseous lesions.   IMPRESSION:   1.  The liver is not adequately evaluated without IV contrast. Liver is nodular and shrunken node consistent with cirrhosis. Cannot exclude an occult mass with the degree of nodularity. Consider MRI which has better soft tissue contrast if the patient cannot receive IV contrast in the  future. 2.  Moderate volume ascites  likely relates to portal hypertension/cirrhosis. 3.  Cholelithiasis without cholecystitis. 4.  Consolidation versus atelectasis at the right lung base with small effusions.  COMPLETE ABDOMINAL ULTRASOUND   Comparison:  September 06, 2012   Findings:  Mild ascites is noted.   Gallbladder:  Multiple gallstones are noted.  Gallbladder wall thickening is noted, but this may be due to adjacent ascites or hepatocellular disease.   Common bile duct:  Measures 4.2 mm which is within normal limits.   Liver:  Nodular margins are noted with increased echogenicity consistent with history of hepatic cirrhosis. No focal mass is noted, although correlation with the alpha-fetoprotein level is recommended given the presence of cirrhosis.   IVC:  Not visualized due to overlying bowel gas.   Pancreas:  Not visualized due to overlying bowel gas.   Spleen:  Measures 6.7 cm which is within normal limits.   Right Kidney:  Measures 10.1 cm which is within normal limits.   Left Kidney:  Measures 10.2 cm which is within normal limits.   Abdominal aorta:  No aneurysm identified.   IMPRESSION: Mild ascites.  Cholelithiasis with gallbladder wall thickening; this most likely is due to ascites or adjacent hepatocellular disease.  Hepatic cirrhosis is noted and correlation with alpha- fetoprotein level is recommended.     ASSESSMENT/PLAN:  67 y.o. 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  1.  GAVE with anemia -- no overt GI bleeding and his hemoglobin has been stable over the last couple of months which argues against significant ongoing GI blood loss. He last antral ablation was March 2014.  Today we will check CBC and iron studies. If his iron remains low, I recommend IV iron which we will attempt to arrange at the [facility]. This is much closer to his home and will be easier for him to access. I would like for him to continue twice-daily PPI for now. If there is evidence for recurrent GI bleeding, or worsening anemia, EGD with repeat APC would be appropriate  2.  Cirrhosis -- from a liver standpoint he has been stable. I feel that the majority of his ascites his cardiac driven. There is no evidence for thrombocytopenia, hepatic encephalopathy, or coagulopathy to suggest decompensation or significant portal hypertension. I have recommended a continued low sodium diet. He can continue periodic paracentesis if necessary and will continue on Lasix 80 mg twice daily and Aldactone 50 mg daily. His Aldactone was just recently increased, thus I did not increase it further today.  We certainly have room to increase the Aldactone in the future if necessary for further fluid control. --We discussed hepatitis A and B. vaccination today, and right now he does not think he can transport to and from his home for this series of injections --Variceal screening, EGD with no esophageal varices 09/08/2012 --HCC screening -- ultrasound and CT scan from March without specific hepatic lesions (CT was without IV contrast due to renal insufficiency).  Would consider repeat cross-sectional imaging with MRI October or November 2014  3. CHF -- followed by Dr. Shirlee Latch and cardiology, he  has followup later this month

## 2012-10-30 ENCOUNTER — Telehealth: Payer: Self-pay | Admitting: Internal Medicine

## 2012-10-30 NOTE — Telephone Encounter (Signed)
Informed pt of results and the need for IV Iron. [facility] did nnot call back and since I spoke with them, I will make the referral and fax the info in am. Pt stated understanding.

## 2012-10-31 NOTE — Telephone Encounter (Signed)
Barbara at [facility] states she received all info and the notes will be reviewed and they will contact the pt.

## 2012-10-31 NOTE — Telephone Encounter (Signed)
Notes Recorded by Lily Lovings, RN on 10/27/2012 at 4:44 PM Called [facility] and left message for Britta Mccreedy to call back regarding new pt referral for IV Iron. Phone number [phone]. Notes Recorded by Beverley Fiedler, MD on 10/27/2012 at 4:35 PM Anemia with hemoglobin 8.6, stable, which likely indicates no significant GI bleeding Iron is low, please refer to [facility] for IV iron infusion           Per Britta Mccreedy at [facility], go to the web site and complete the referral and fax info to [phone]; done. Called [facility] to see if everything is in order and they don't open until 09:00am.

## 2012-10-31 NOTE — Telephone Encounter (Signed)
Pt called to check on her B12 and BUN/CREAT; pt given results with discussion on continuing improvement in BUN/CREAT. Pt stated understanding. Left message for Britta Mccreedy at [facility] to call back.

## 2012-11-01 ENCOUNTER — Telehealth: Payer: Self-pay | Admitting: Internal Medicine

## 2012-11-01 NOTE — Telephone Encounter (Signed)
Pt reports he can't get IV Iron until next week and I informed him that is good, surprised it isn't a longer delay. Pt states he is very weak. Pt is worried his fluid is building up again; states he feels the " pouch in his stomach where the water is". His last paracentesis was 10/25/12 and he seems to last about 2 weeks before he is due again. He is on Lasix 80 mg BID and Aldactone 25 mg BID which was last changed by Tereso Newcomer, [facility] on 10/16/12. Informed pt I will send Dr Rhea Belton a note and he states he may be able to wait a little longer before another paracentesis. Please advise if anything needs to be done. Thanks.

## 2012-11-01 NOTE — Telephone Encounter (Signed)
Informed pt of Dr Lauro Franklin recommendations; he will call Cardiology when he feels he needs another paracentesis. Will forward note to Dr Shirlee Latch and Tereso Newcomer, PA C. Pt stated understanding.

## 2012-11-01 NOTE — Telephone Encounter (Signed)
Continue low-sodium diet At this point, we will wait on iron infusion which should make his hemoglobin better over time He will likely require another paracentesis, cardiology had been arranging this previously? Does he have a date scheduled? He can the reschedule for paracentesis when symptoms warrant

## 2012-11-12 ENCOUNTER — Telehealth: Payer: Self-pay | Admitting: Cardiology

## 2012-11-12 DIAGNOSIS — R188 Other ascites: Secondary | ICD-10-CM

## 2012-11-12 NOTE — Telephone Encounter (Signed)
The patient called stating that he again had ascites and that he wanted to have a paracentesis again on Tuesday.  He did not want to come to the hospital and refused the ER although I suggested this and he understands the risks of not seeking immediate treatment.  I will pass this message along to Dr. Shirlee Latch and staff.  The patient understands that they are not in the office until Tuesday owing to the [facility].

## 2012-11-13 NOTE — Telephone Encounter (Signed)
Let's try to arrange for paracentesis for hime.

## 2012-11-14 ENCOUNTER — Telehealth: Payer: Self-pay | Admitting: Cardiology

## 2012-11-14 DIAGNOSIS — R188 Other ascites: Secondary | ICD-10-CM

## 2012-11-14 MED ORDER — SPIRONOLACTONE 25 MG PO TABS: 50.0000 mg | ORAL_TABLET | Freq: Every day | ORAL | Status: DC

## 2012-11-14 NOTE — Addendum Note (Signed)
Addended by: Jacqlyn Krauss on: 11/14/2012 07:37 AM   Modules accepted: Orders

## 2012-11-14 NOTE — Telephone Encounter (Signed)
Paracentesis has been ordered, to [facility] to schedule. Pt aware.

## 2012-11-14 NOTE — Telephone Encounter (Signed)
New Prob     Requesting new prescription of SPRONOLACTONE.

## 2012-11-14 NOTE — Telephone Encounter (Signed)
New problem    Per pt has fluid on his stomach and wants to be seen today

## 2012-11-15 ENCOUNTER — Ambulatory Visit (HOSPITAL_COMMUNITY)
Admission: RE | Admit: 2012-11-15 | Discharge: 2012-11-15 | Disposition: A | Discharge status: Home / Self Care | Payer: PRIVATE HEALTH INSURANCE | Source: Ambulatory Visit | Attending: Cardiology | Admitting: Cardiology

## 2012-11-15 DIAGNOSIS — R188 Other ascites: Secondary | ICD-10-CM | POA: Insufficient documentation

## 2012-11-16 ENCOUNTER — Encounter: Payer: Self-pay | Admitting: Cardiology

## 2012-11-16 ENCOUNTER — Ambulatory Visit (INDEPENDENT_AMBULATORY_CARE_PROVIDER_SITE_OTHER): Payer: PRIVATE HEALTH INSURANCE | Admitting: Cardiology

## 2012-11-16 VITALS — BP 126/62 | HR 81 | Ht 64.75 in | Wt 171.0 lb

## 2012-11-16 DIAGNOSIS — N189 Chronic kidney disease, unspecified: Secondary | ICD-10-CM | POA: Insufficient documentation

## 2012-11-16 DIAGNOSIS — I509 Heart failure, unspecified: Secondary | ICD-10-CM

## 2012-11-16 DIAGNOSIS — K31819 Angiodysplasia of stomach and duodenum without bleeding: Secondary | ICD-10-CM

## 2012-11-16 DIAGNOSIS — R0602 Shortness of breath: Secondary | ICD-10-CM

## 2012-11-16 DIAGNOSIS — K7460 Unspecified cirrhosis of liver: Secondary | ICD-10-CM

## 2012-11-16 DIAGNOSIS — I2510 Atherosclerotic heart disease of native coronary artery without angina pectoris: Secondary | ICD-10-CM

## 2012-11-16 DIAGNOSIS — I5022 Chronic systolic (congestive) heart failure: Secondary | ICD-10-CM

## 2012-11-16 LAB — BASIC METABOLIC PANEL
Calcium: 8.5 mg/dL (ref 8.4–10.5)
Creatinine, Ser: 1.5 mg/dL (ref 0.4–1.5)
GFR: 51.75 mL/min — ABNORMAL LOW (ref >60.00)
Glucose, Bld: 156 mg/dL — ABNORMAL HIGH (ref 70–99)
Sodium: 135 mEq/L (ref 135–145)

## 2012-11-16 LAB — LIPID PANEL
HDL: 36.7 mg/dL — ABNORMAL LOW (ref >39.00)
Total CHOL/HDL Ratio: 2
Triglycerides: 110 mg/dL (ref 0.0–149.0)
VLDL: 22 mg/dL (ref 0.0–40.0)

## 2012-11-16 NOTE — Progress Notes (Signed)
Patient ID: [name], male   DOB: 19-Jun-1946, 67 y.o.   MRN: [id number] PCP Dr. 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  I initially saw him in the hospital 3/14.  He was admitted with acute on chronic systolic CHF.  Cardiac MRI showed EF 37% (echo was difficult study).  He was started on milrinone and diuresed while in the hospital.  RHC on milrinone showed relatively compensated filling pressures and he was converted over to po Lasix.  He was noted to have significant ascites and had a therapeutic paracentesis while in the hospital.  Imaging showed cirrhosis. Hepatitis panel and AFP were negative.  He also was anemic and was followed by GI with EGD and APC of GAVE.  I saw him on 3/26, and he had re-developed significant abdominal swelling.  I sent him for a 2nd US-guided paracentesis which was done on 3/28 with removal of 4.3 L ascites.  On 3/30, he was re-admitted with ongoing dyspnea and RUQ pain.  He had a workup for cholecystitis which showed cholelithiasis but not cholecystitis. He has been having serial paracenteses since that time.  I have titrated up his Lasix and spironolactone.  Last paracentesis this week was unsuccessful (not as much ascites as in the past). He saw [name] recently with GI.  Ascites thought to be predominantly cardiac-driven.    [name] seems to be doing better.  He thinks spironolactone has slowed the rate of abdominal swelling.  He has been using his walker when leaving the house but does not think he really needs it.  He is not very active but denies exertional dyspnea.  He sleeps on 2 pillows chronically.  No PND.  No chest pain.  I tried him on hydralazine/nitrates in the past given low EF and renal dysfunction but he did not tolerate it due to hypotension.  Wieght is down 5 lbs since last appointment.   Labs (3/14): K 4, creatinine 2.3, HCT 25.7 Labs (4/14): K 3.2,  creatinine 1.97, LFTs normal, albumin 2.1, HCT 25.9 Labs (5/14): K 4.5, creatinine 1.4  Past Medical History:  1. Type II diabetes  2. HTN  3. Hyperlipidemia  4. CAD:  - CABG 1999  - PCI 2007 to SVG-OM  - Last cath 2008 showed patent LIMA-LAD, SVG-OM, and SVG-PDA. Patient had PTCA to PLV at that time.  5. Systolic CHF: Echo in 2009 showed EF 50-55%. ? EF 40% on echo subsequently in [place]. Echo in 3/14 at [facility] was a very difficult study.  Cardiac MRI (without contrast given elevated creatinine) showed EF 37% with mid-apical anterior, anteroseptal, and inferior moderate to severe hypokinesis.  RHC (3/14) with mean RA 7, PA 31/19, mean PCWP 21, CI 3.66 (on milrinone).   6. Obesity  7. CKD  8. 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  No h/o ETOH.  Hepatitis panel and AFP negative in 3/14. He has had ascites requiring multiple paracenteses.  9. GAVE with anemia: EGD with APC in 3/14.  He is on PPI.  10. CKD 11. H/o CEA: carotid dopplers (4/14) with 40-59% RICA stenosis.   SH: Married, retired, lives in [place].  He no longer smokes.   FH: CAD  ROS: All systems reviewed and negative except as per HPI.   Current Outpatient Prescriptions  Medication Sig Dispense Refill  . aspirin EC 81 MG tablet Take 81 mg by mouth daily.      Marland Kitchen  B Complex-C-Folic Acid (B COMPLEX-VITAMIN C-FOLIC ACID) 1 MG tablet Take 1 tablet by mouth daily.       . carvedilol (COREG) 6.25 MG tablet Take 1 tablet (6.25 mg total) by mouth 2 (two) times daily.  60 tablet  3  . cholecalciferol (VITAMIN D) 1000 UNITS tablet Take 1,000 Units by mouth daily.      . colchicine 0.6 MG tablet 1 two times a day for one day, then 1 daily until toe pain has resolved.  15 tablet  0  . Cyanocobalamin (VITAMIN B-12 PO) Take 1 tablet by mouth daily.      Marland Kitchen docusate sodium (COLACE) 100 MG capsule Take 100 mg by mouth at bedtime.      . ferrous sulfate 325 (65 FE) MG tablet Take 325 mg by mouth daily.       . furosemide  (LASIX) 80 MG tablet Take 1 tablet (80 mg total) by mouth 2 (two) times daily.  60 tablet  6  . insulin glargine (LANTUS) 100 UNIT/ML injection Inject 10 Units into the skin every morning.       . isosorbide mononitrate (IMDUR) 30 MG 24 hr tablet Take 1 tablet (30 mg total) by mouth daily.  30 tablet  0  . Multiple Vitamin (MULITIVITAMIN WITH MINERALS) TABS Take 1 tablet by mouth daily.      . nitroGLYCERIN (NITROSTAT) 0.4 MG SL tablet Place 0.4 mg under the tongue every 5 (five) minutes as needed for chest pain. If pain continues after 3 doses call 911      . ONE TOUCH ULTRA TEST test strip       . oxyCODONE (OXY IR/ROXICODONE) 5 MG immediate release tablet Take 5 mg by mouth every 4 (four) hours as needed for pain.      . pantoprazole (PROTONIX) 40 MG tablet Take 1 tablet (40 mg total) by mouth 2 (two) times daily.  60 tablet  0  . polyethylene glycol (MIRALAX / GLYCOLAX) packet Take 17 g by mouth daily as needed (for constipation).       . promethazine (PHENERGAN) 25 MG tablet As needed      . simvastatin (ZOCOR) 20 MG tablet Take 20 mg by mouth every evening.      Marland Kitchen spironolactone (ALDACTONE) 25 MG tablet Take 2 tablets (50 mg total) by mouth daily.  60 tablet  11  . sucralfate (CARAFATE) 1 G tablet Take 1 g by mouth 5 (five) times daily.       No current facility-administered medications for this visit.    BP 126/62  Pulse 81  Ht 5' 4.75" (1.645 m)  Wt 171 lb (77.565 kg)  BMI 28.66 kg/m2  SpO2 96% General: NAD Neck: JVP 7 cm, no thyromegaly or thyroid nodule.  Lungs: Decreased breath sounds right base.  CV: Nondisplaced PMI.  Heart regular S1/S2, no S3/S4, no murmur.  1+ edema 1/3 up right lower leg.  No carotid bruit.  Normal pedal pulses.  Abdomen: Soft, nontender, no hepatosplenomegaly, very mild distention.   Neurologic: Alert and oriented x 3.  Psych: Normal affect. Extremities: No clubbing or cyanosis.   Assessment/Plan: 1. CHF: Chronic systolic CHF, NYHA class II-III  symptoms (improved). EF 37% with wall motion abnormalities on cardiac MRI (echo was difficult study).  JVP does not seem elevated and abdomen does not appear particularly distended, weight is down => improved volume status.  - Continue Lasix at 80 mg bid and spironolactone 50 mg daily.  - Will need BMET  and BNP today.  - Holding off on ACEI with CKD.  He did not tolerate hydralazine/nitrates due to hypotension.  - Continue current Coreg.   2. 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  Workup negative for viral hepatitis and never a drinker.  He has required multiple paracenteses due to ascites and abdominal distention.  Spironolactone initiation seems to be helping to control ascites.   3. Anemia: Suspect upper GI bleeding from GAVE.  Had APC in hospital.  CBC has been stable.  Has followup with GI.  4. CKD: Suspect there is a cardiorenal component.  Will have to follow creatinine carefully with diuresis. He follows with nephrology.  5. CAD: No chest pain.  At this point, I do not think there is indication for functional study. He would not be a good cath candidate given his creatinine.  I stopped ranolazine in the past given his low GFR (he has been started on Imdur so hopefully will not have angina).  Continue ASA, statin.  6. Hyperlipidemia: Will check lipids today.  7. Carotid stenosis: Repeat dopplers in 4/15.     Followup in 1 month.    Marca Ancona 11/16/2012

## 2012-11-16 NOTE — Patient Instructions (Addendum)
Your physician recommends that you have  lab work today--BMET/BNP/lipid profile   Your physician recommends that you schedule a follow-up appointment in: 1 month with Dr Shirlee Latch.

## 2012-11-25 ENCOUNTER — Telehealth: Payer: Self-pay | Admitting: Gastroenterology

## 2012-11-25 NOTE — Telephone Encounter (Signed)
Called c/o nausea and vomiting.  Denies abdominal pain, diarrhea, fever. Advised to take liquids, call PCP if symptoms do not subside over next 6 hours.

## 2012-11-26 ENCOUNTER — Telehealth: Payer: Self-pay | Admitting: Gastroenterology

## 2012-11-26 NOTE — Telephone Encounter (Signed)
Patient called twice because of nausea and vomiting. He denies abdominal pain, fever or diarrhea. I spoke with him the day and again in the evening. Since symptoms continued into the evening I instructed him to go to the emergency room for evaluation and therapy.

## 2012-11-27 NOTE — Telephone Encounter (Signed)
lmom for pt to call back. No record of ER visits here and I called [facility] and no ER visits this weekend either.

## 2012-11-27 NOTE — Telephone Encounter (Signed)
Pt reports he did not go to an ER. He vomited 4 times Friday PM and today still feels nauseated at times. He had 2 BMs today, but not watery stools. I offered to order Zofran since he states the Phenergan makes him sleepy, but he states he will try Pepto Bismol. Warned pt his stools may be dark with the pepto. Offered him an appt, but he states he will just try the Pepto.

## 2012-11-27 NOTE — Telephone Encounter (Signed)
lmom for pt to call back

## 2012-11-27 NOTE — Telephone Encounter (Signed)
CB, please check to see if he went to outside ED and if so what was done. Thanks

## 2012-11-28 ENCOUNTER — Other Ambulatory Visit: Payer: Self-pay | Admitting: Cardiology

## 2012-11-29 ENCOUNTER — Other Ambulatory Visit: Payer: Self-pay | Admitting: *Deleted

## 2012-11-30 ENCOUNTER — Encounter: Payer: Self-pay | Admitting: Cardiology

## 2012-12-01 ENCOUNTER — Ambulatory Visit: Payer: PRIVATE HEALTH INSURANCE | Admitting: Cardiology

## 2012-12-01 ENCOUNTER — Encounter: Payer: Self-pay | Admitting: Cardiology

## 2012-12-01 ENCOUNTER — Ambulatory Visit (INDEPENDENT_AMBULATORY_CARE_PROVIDER_SITE_OTHER): Payer: PRIVATE HEALTH INSURANCE | Admitting: Cardiology

## 2012-12-01 VITALS — BP 118/74 | HR 78 | Ht 67.0 in | Wt 163.0 lb

## 2012-12-01 DIAGNOSIS — E785 Hyperlipidemia, unspecified: Secondary | ICD-10-CM

## 2012-12-01 DIAGNOSIS — M545 Low back pain: Secondary | ICD-10-CM

## 2012-12-01 DIAGNOSIS — N189 Chronic kidney disease, unspecified: Secondary | ICD-10-CM

## 2012-12-01 DIAGNOSIS — G8929 Other chronic pain: Secondary | ICD-10-CM

## 2012-12-01 DIAGNOSIS — R188 Other ascites: Secondary | ICD-10-CM

## 2012-12-01 DIAGNOSIS — I5022 Chronic systolic (congestive) heart failure: Secondary | ICD-10-CM

## 2012-12-01 DIAGNOSIS — I2510 Atherosclerotic heart disease of native coronary artery without angina pectoris: Secondary | ICD-10-CM

## 2012-12-01 DIAGNOSIS — I509 Heart failure, unspecified: Secondary | ICD-10-CM

## 2012-12-01 DIAGNOSIS — N179 Acute kidney failure, unspecified: Secondary | ICD-10-CM

## 2012-12-01 MED ORDER — SPIRONOLACTONE 25 MG PO TABS: 25.0000 mg | ORAL_TABLET | Freq: Two times a day (BID) | ORAL | Status: DC

## 2012-12-01 NOTE — Patient Instructions (Addendum)
Keep scheduled appointment with Dr.McLean on December 21, 2012  Your physician has recommended you make the following change in your medication:  Increase spironolactone to 25 mg by mouth twice daily  Call our office if your weight is going up or you start retaining fluid.

## 2012-12-03 NOTE — Progress Notes (Signed)
Patient ID: [name], male   DOB: [birth year], 67 y.o.   MRN: [id number] PCP Dr. 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  I initially saw him in the hospital 3/14.  He was admitted with acute on chronic systolic CHF.  Cardiac MRI showed EF 37% (echo was difficult study).  He was started on milrinone and diuresed while in the hospital.  RHC on milrinone showed relatively compensated filling pressures and he was converted over to po Lasix.  He was noted to have significant ascites and had a therapeutic paracentesis while in the hospital.  Imaging showed cirrhosis. Hepatitis panel and AFP were negative.  He also was anemic and was followed by GI with EGD and APC of GAVE.  I saw him on 3/26, and he had re-developed significant abdominal swelling.  I sent him for a 2nd US-guided paracentesis which was done on 3/28 with removal of 4.3 L ascites.  On 3/30, he was re-admitted with ongoing dyspnea and RUQ pain.  He had a workup for cholecystitis which showed cholelithiasis but not cholecystitis. He has been having serial paracenteses since that time (though none in the last several weeks).  I have titrated up his Lasix and spironolactone. He saw Dr. Rhea Belton recently with GI.  Ascites thought to be predominantly cardiac-driven.    Since his last appointment here, Mr Barnier seems to have developed a GI virus with vomiting for several days.  This has resolved.  However, he has lost 8 lbs.  He saw his PCP and was noted to be mildly hypotensive with SBP in 80s-90s.  Imdur was stopped, Coreg was decreased to 3.125 mg bid, and Lasix and spironolactone were cut in half to 40 mg bid and 25 mg daily.   BP today is 118/74.  SBP at home has been in the 100s-110s.  No chest pain.  No exertional dyspnea.  Abdomen is not distended.    Labs (3/14): K 4, creatinine 2.3, HCT 25.7 Labs (4/14): K 3.2, creatinine 1.97, LFTs normal, albumin  2.1, HCT 25.9 Labs (5/14): K 4.5, creatinine 1.4 Labs (5/14): LDL 32, HDL 37 Labs (6/14): K 4.2, creatinine 1.5, LFTs normal  Past Medical History:  1. Type II diabetes  2. HTN  3. Hyperlipidemia  4. CAD:  - CABG 1999  - PCI 2007 to SVG-OM  - Last cath 2008 showed patent LIMA-LAD, SVG-OM, and SVG-PDA. Patient had PTCA to PLV at that time.  5. Systolic CHF: Echo in 2009 showed EF 50-55%. ? EF 40% on echo subsequently in [place]. Echo in 3/14 at [facility] was a very difficult study.  Cardiac MRI (without contrast given elevated creatinine) showed EF 37% with mid-apical anterior, anteroseptal, and inferior moderate to severe hypokinesis.  RHC (3/14) with mean RA 7, PA 31/19, mean PCWP 21, CI 3.66 (on milrinone).   6. Obesity  7. CKD  8. 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  No h/o ETOH.  Hepatitis panel and AFP negative in 3/14. He has had ascites requiring multiple paracenteses.  9. GAVE with anemia: EGD with APC in 3/14.  He is on PPI.  10. CKD 11. H/o CEA: carotid dopplers (4/14) with 40-59% RICA stenosis.   SH: Married, retired, lives in [place].  He no longer smokes.   FH: CAD  ROS: All systems reviewed and negative except as per HPI.   Current Outpatient Prescriptions  Medication Sig Dispense Refill  .  aspirin EC 81 MG tablet Take 81 mg by mouth daily.      . B Complex-C-Folic Acid (B COMPLEX-VITAMIN C-FOLIC ACID) 1 MG tablet Take 1 tablet by mouth daily.       . carvedilol (COREG) 3.125 MG tablet Take 3.125 mg by mouth 2 (two) times daily with a meal.      . cholecalciferol (VITAMIN D) 1000 UNITS tablet Take 1,000 Units by mouth daily.      . colchicine 0.6 MG tablet 1 two times a day for one day, then 1 daily until toe pain has resolved.  15 tablet  0  . Cyanocobalamin (VITAMIN B-12 PO) Take 1 tablet by mouth daily.      Marland Kitchen docusate sodium (COLACE) 100 MG capsule Take 100 mg by mouth at bedtime.      . furosemide (LASIX) 40 MG tablet Take 40 mg by mouth 2 (two)  times daily.      . insulin glargine (LANTUS) 100 UNIT/ML injection Inject 20 Units into the skin nightly.      . Multiple Vitamin (MULITIVITAMIN WITH MINERALS) TABS Take 1 tablet by mouth daily.      . nitroGLYCERIN (NITROSTAT) 0.4 MG SL tablet Place 0.4 mg under the tongue every 5 (five) minutes as needed for chest pain. If pain continues after 3 doses call 911      . ONE TOUCH ULTRA TEST test strip       . oxyCODONE (OXY IR/ROXICODONE) 5 MG immediate release tablet Take 5 mg by mouth every 4 (four) hours as needed for pain.      . pantoprazole (PROTONIX) 40 MG tablet Take 1 tablet (40 mg total) by mouth 2 (two) times daily.  60 tablet  0  . polyethylene glycol (MIRALAX / GLYCOLAX) packet Take 17 g by mouth daily as needed (for constipation).       . promethazine (PHENERGAN) 25 MG tablet Take 25 mg by mouth every 6 (six) hours as needed for nausea.      . simvastatin (ZOCOR) 20 MG tablet 20 mg. Take 1 tablet (20 mg total) by mouth nightly.      Marland Kitchen spironolactone (ALDACTONE) 25 MG tablet Take 1 tablet (25 mg total) by mouth 2 (two) times daily.  60 tablet  6  . sucralfate (CARAFATE) 1 G tablet Take 1 g by mouth 5 (five) times daily.      . ferrous sulfate 325 (65 FE) MG tablet Take 325 mg by mouth daily.       . isosorbide mononitrate (IMDUR) 30 MG 24 hr tablet Take 1 tablet (30 mg total) by mouth daily.  30 tablet  0   No current facility-administered medications for this visit.    BP 118/74  Pulse 78  Ht 5\' 7"  (1.702 m)  Wt 163 lb (73.936 kg)  BMI 25.52 kg/m2 General: NAD Neck: JVP 8 cm, no thyromegaly or thyroid nodule.  Lungs: Decreased breath sounds right base.  CV: Nondisplaced PMI.  Heart regular S1/S2, no S3/S4, 2/6 early SEM.  No edema.  No carotid bruit.  Normal pedal pulses.  Abdomen: Soft, nontender, no hepatosplenomegaly, very mild distention.   Neurologic: Alert and oriented x 3.  Psych: Normal affect. Extremities: No clubbing or cyanosis.   Assessment/Plan: 1. CHF:  Chronic systolic CHF, NYHA class II-III symptoms (improved). EF 37% with wall motion abnormalities on cardiac MRI (echo was difficult study).  JVP does not seem significantly elevated today and abdomen does not appear particularly distended.  Weight  is down 8 lbs in the setting of a GI illness.   - Holding off on ACEI with CKD.  He did not tolerate hydralazine/nitrates due to hypotension.  - Continue Coreg at lower dose, 3.125 mg bid. - Continue Lasix at 40 mg bid for now but increase spironolactone back to 50 mg daily.  - Will need close followup to make sure he does not develop significant volume overload with lower Lasix.  He will followup in 7/3 for re-evaluation of volume status and will likely titrate Coreg back up to 6.25 mg bid at that time.   2. 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  Workup negative for viral hepatitis and never a drinker.  He has required multiple paracenteses due to ascites and abdominal distention.  Spironolactone initiation seems to be helping to control ascites.   3. Anemia: Suspect upper GI bleeding from GAVE.  Had APC in hospital.  CBC has been stable.  Has followup with GI.  4. CKD: Suspect there is a cardiorenal component.  Will have to follow creatinine carefully with diuresis. He follows with nephrology.  5. CAD: No chest pain.  At this point, I do not think there is indication for functional study. He would not be a good cath candidate given his creatinine.  I stopped ranolazine in the past given his low GFR. Continue ASA, statin.  He is off Imdur with no chest pain, so I do not think he needs to restart it.  6. Hyperlipidemia: Good lipids on statin.  7. Carotid stenosis: Repeat dopplers in 4/15.      Marca Ancona 12/03/2012

## 2012-12-06 ENCOUNTER — Other Ambulatory Visit: Payer: Self-pay | Admitting: *Deleted

## 2012-12-06 MED ORDER — CARVEDILOL 3.125 MG PO TABS: 3.1250 mg | ORAL_TABLET | Freq: Two times a day (BID) | ORAL | Status: DC

## 2012-12-18 ENCOUNTER — Telehealth: Payer: Self-pay | Admitting: Cardiology

## 2012-12-18 NOTE — Telephone Encounter (Signed)
LMTCB

## 2012-12-18 NOTE — Telephone Encounter (Signed)
Would have him continue current doses but take pm dose earlier in the afternoon.

## 2012-12-18 NOTE — Telephone Encounter (Signed)
New Problem  Pt wants to know can he back off on his diuretic pill. He said he is up all night long using the bathroom.

## 2012-12-18 NOTE — Telephone Encounter (Signed)
Spoke with patient. Pt is taking spironolactone 25mg  bid and lasix 40mg  bid. Pt is asking if he can decrease one of these medications because he was up all night last night going to the bathroom. He denies swelling in feet or abdomen. He states his weight is stable at 161 lbs. I will forward to Dr Shirlee Latch for review.

## 2012-12-18 NOTE — Telephone Encounter (Signed)
Pt advised,verbalized understanding. 

## 2012-12-21 ENCOUNTER — Ambulatory Visit: Payer: PRIVATE HEALTH INSURANCE | Admitting: Cardiology

## 2013-01-29 ENCOUNTER — Other Ambulatory Visit: Payer: Self-pay | Admitting: Cardiology

## 2013-02-05 ENCOUNTER — Telehealth: Payer: Self-pay | Admitting: Cardiology

## 2013-02-05 NOTE — Telephone Encounter (Signed)
Pt fell and broke three ribs this am, BP running 125/79 , pls call

## 2013-02-05 NOTE — Telephone Encounter (Signed)
Reviewed with Dr Darvin Neighbours recommended pt hold coreg until office visit 02/07/13. After further review with Dr Shirlee Latch- pt will hold coreg tonight, check BP tomorrow and take coreg if BP is OK tomorrow.

## 2013-02-05 NOTE — Telephone Encounter (Signed)
Pt states his BP this AM was 89/73. He was advised not to take coreg this AM. Pt fell earlier today, went to ED and was told he had broken 3 ribs. Pt states BP now 122/78.

## 2013-02-07 ENCOUNTER — Ambulatory Visit: Payer: PRIVATE HEALTH INSURANCE | Admitting: Cardiology

## 2013-02-20 ENCOUNTER — Other Ambulatory Visit: Payer: Self-pay | Admitting: Gastroenterology

## 2013-02-20 ENCOUNTER — Telehealth: Payer: Self-pay | Admitting: Internal Medicine

## 2013-02-20 MED ORDER — PANTOPRAZOLE SODIUM 40 MG PO TBEC: 40.0000 mg | DELAYED_RELEASE_TABLET | Freq: Two times a day (BID) | ORAL | Status: DC

## 2013-02-20 MED ORDER — HYOSCYAMINE SULFATE 0.125 MG SL SUBL: 0.1250 mg | SUBLINGUAL_TABLET | SUBLINGUAL | Status: DC | PRN

## 2013-02-20 NOTE — Telephone Encounter (Signed)
Sent protonix to pt's pharmacy

## 2013-04-17 ENCOUNTER — Other Ambulatory Visit: Payer: Self-pay | Admitting: Internal Medicine

## 2013-04-24 ENCOUNTER — Telehealth: Payer: Self-pay

## 2013-04-24 NOTE — Telephone Encounter (Signed)
New problem    C/O back itching . Patient stated PCP was  Not contacted but wanted Dr. Shirlee Latch to know what he can prescribe .

## 2013-04-30 ENCOUNTER — Other Ambulatory Visit: Payer: Self-pay | Admitting: Cardiology

## 2013-04-30 NOTE — Telephone Encounter (Signed)
I attempted to contact pt last week and was unable to reach him by telephone.

## 2013-05-02 NOTE — Telephone Encounter (Signed)
Pt states itching is better

## 2013-05-21 ENCOUNTER — Telehealth: Payer: Self-pay | Admitting: Cardiology

## 2013-05-21 NOTE — Telephone Encounter (Signed)
New Problem:   Pt is requesting a call from [place]. Pt states he will give more details when the nurse calls.

## 2013-05-21 NOTE — Telephone Encounter (Signed)
Attempted to contact pt--mailbox not set up.

## 2013-05-21 NOTE — Telephone Encounter (Signed)
I received a page from the answering service that said the patient had called back because he hadn't gotten a call back from the nurse. See Anne's note about his mailbox being full. The first time I called, his wife said I needed to call a different number and gave me the same phone number (that was given to answering service) to try again. I called it and it gave a message saying his voicemail was not set up so I could not leave a message. I called the same number a 3rd time and his wife answered again. She said he wasn't currently at the house but thinks he may have had questions about his medications. He was trying to reach [place] earlier today. I instructed her to have him contact us back tonight if he needs assistance, or to call office back tomorrow when they open to speak to [place]. She verbalized understanding and gratitude. Dayna Dunn PA-C

## 2013-05-22 ENCOUNTER — Telehealth: Payer: Self-pay | Admitting: Cardiology

## 2013-05-22 NOTE — Telephone Encounter (Signed)
New message     C/o swelling in feet---pt states he has called several times and cannot get anyone to return his call--he want to talk to a nurse

## 2013-05-22 NOTE — Telephone Encounter (Signed)
Can take extra Lasix this afternoon, keep right foot elevated, I need to see him in the office, it has been a while.

## 2013-05-22 NOTE — Telephone Encounter (Signed)
I called number listed for patient [phone], patient's wife answered and states she is not currently with the patient and to call him on [phone]. I called the number given and got message that patient's VM has not been set up yet and the patient did not answer.  I will try back later.

## 2013-05-22 NOTE — Telephone Encounter (Signed)
Returned call to patient's wife.Dr.Mclean advised take a extra lasix this afternoon,keep right foot elevated.Appointment scheduled with Dr.Mclean 07/02/13 at 4:00 pm.Wife wanted to wait until after first of year.Stated they were in the process of moving and everything in a mess.

## 2013-05-22 NOTE — Telephone Encounter (Signed)
Spoke with patient who c/o right foot swelling x 2-3 days; patient c/o redness to the site but denies fluid seepage or pain.  Patient states Dr. Sol Passer, PCP reduced his Spironolactone and Lasix a few weeks ago, he thinks because of low BP.  Patient reports that he is currently taking Lasix 40 mg QD and Spironolactone 25 mg QD.  Patient denies chest discomfort or SOB.  Patient states he has not gained any weight, states he has lost 17 lb on low sodium diet.  Patient states he is propping up his foot when he sits and he does not think that it is helping.  I advised patient that I will send message to Dr. Shirlee Latch for advice.  Patient verbalized agreement and understanding.

## 2013-05-30 ENCOUNTER — Other Ambulatory Visit: Payer: Self-pay

## 2013-05-30 ENCOUNTER — Telehealth: Payer: Self-pay | Admitting: Cardiology

## 2013-05-30 MED ORDER — SUCRALFATE 1 G PO TABS: 1.0000 g | ORAL_TABLET | Freq: Every day | ORAL | Status: AC

## 2013-05-30 NOTE — Telephone Encounter (Deleted)
error 

## 2013-06-20 ENCOUNTER — Encounter: Payer: Self-pay | Admitting: *Deleted

## 2013-06-20 ENCOUNTER — Other Ambulatory Visit: Payer: Self-pay | Admitting: *Deleted

## 2013-06-20 DIAGNOSIS — N183 Chronic kidney disease, stage 3 (moderate): Secondary | ICD-10-CM

## 2013-07-02 ENCOUNTER — Ambulatory Visit: Payer: PRIVATE HEALTH INSURANCE | Admitting: Cardiology

## 2013-07-05 ENCOUNTER — Other Ambulatory Visit: Payer: Self-pay | Admitting: *Deleted

## 2013-07-05 MED ORDER — FUROSEMIDE 40 MG PO TABS: 40.0000 mg | ORAL_TABLET | Freq: Two times a day (BID) | ORAL | Status: DC

## 2013-08-07 ENCOUNTER — Other Ambulatory Visit: Payer: Self-pay | Admitting: Cardiology

## 2013-08-08 ENCOUNTER — Other Ambulatory Visit: Payer: Self-pay | Admitting: *Deleted

## 2013-08-08 MED ORDER — FUROSEMIDE 40 MG PO TABS: 40.0000 mg | ORAL_TABLET | Freq: Two times a day (BID) | ORAL | Status: DC

## 2013-08-12 IMAGING — US US PARACENTESIS
1 series · 7 of 7 positions shown · non-contrast
Comparison: Previous paracentesis

CLINICAL DATA: Recurrent ascites

ULTRASOUND GUIDED PARACENTESIS

[Series 1: us paracentesis · 0.32mm/px · 7 of 7 slices shown]
[im 1/7]
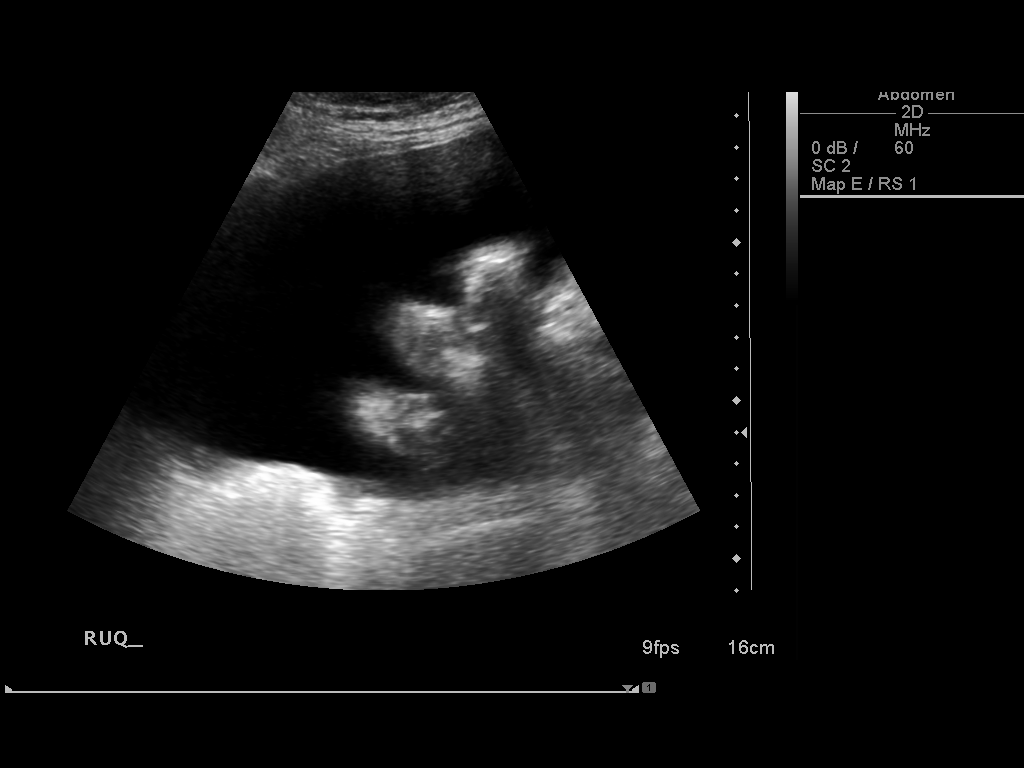
[im 2/7]
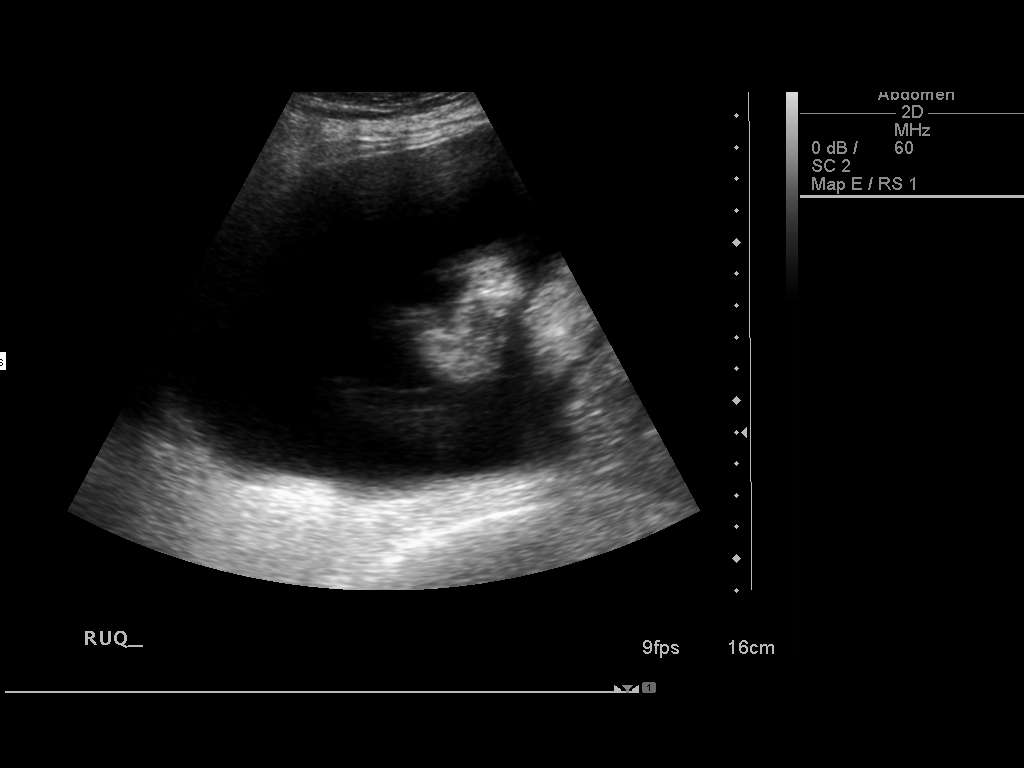
[im 3/7]
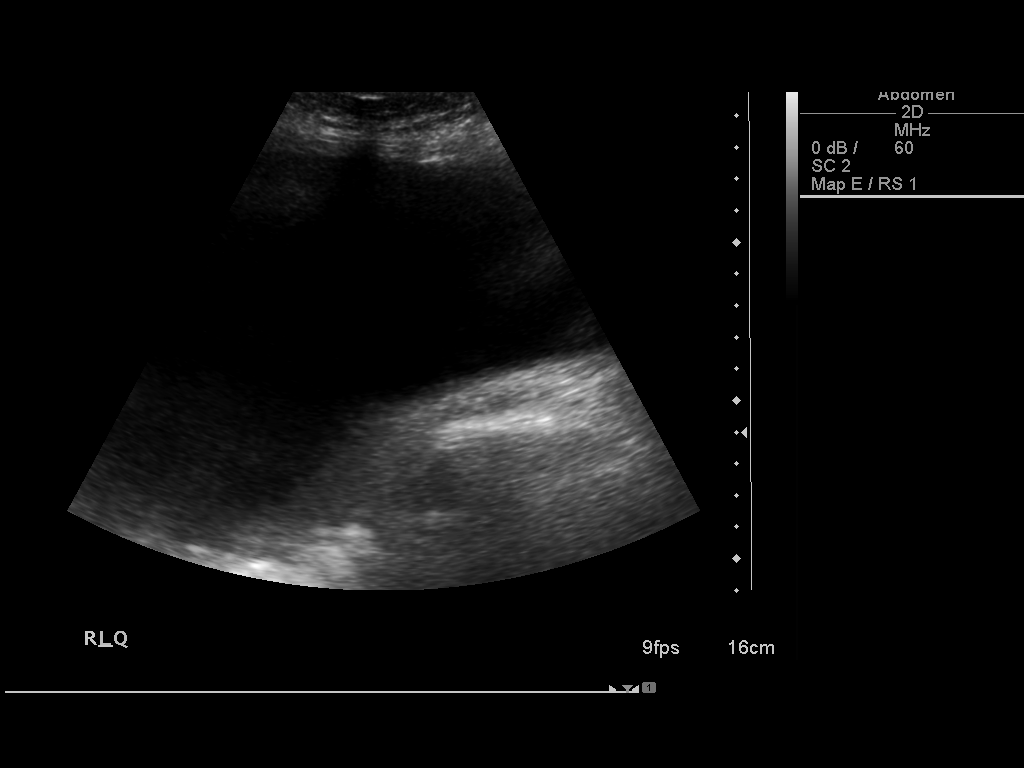
[im 4/7]
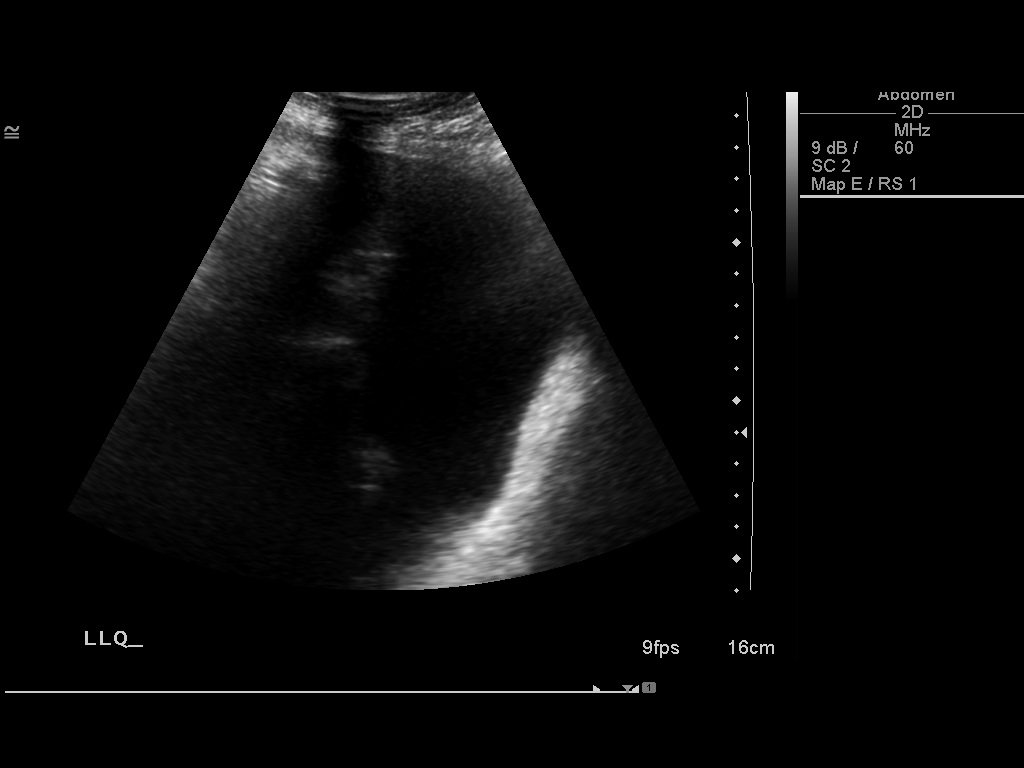
[im 5/7]
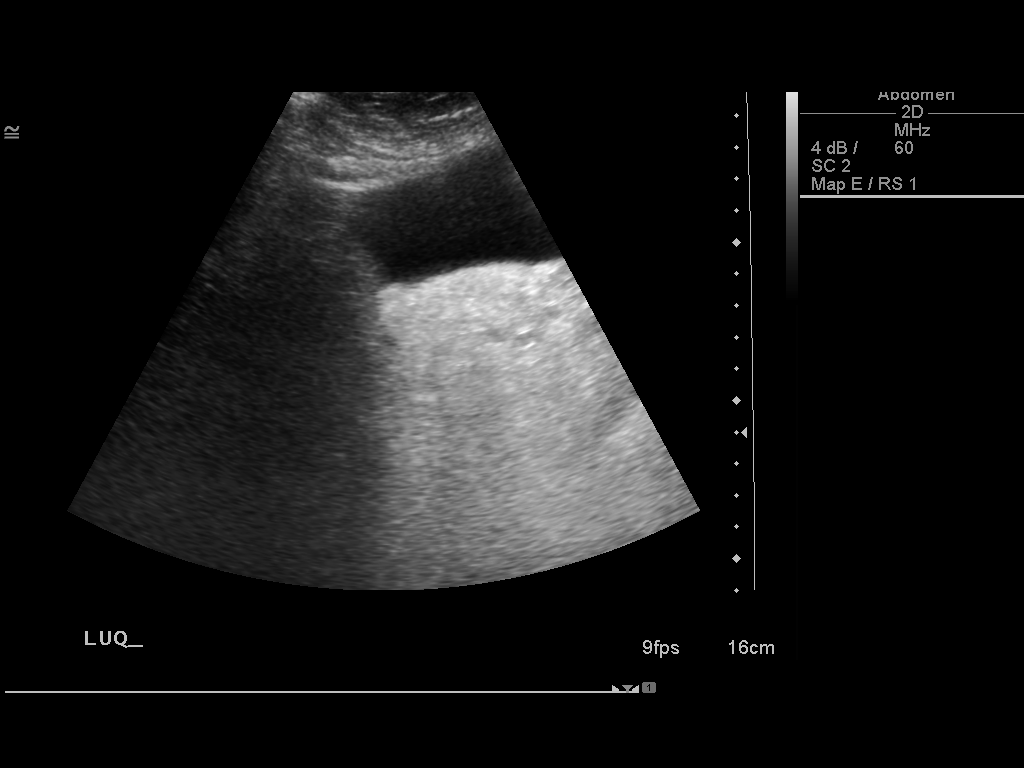
[im 6/7]
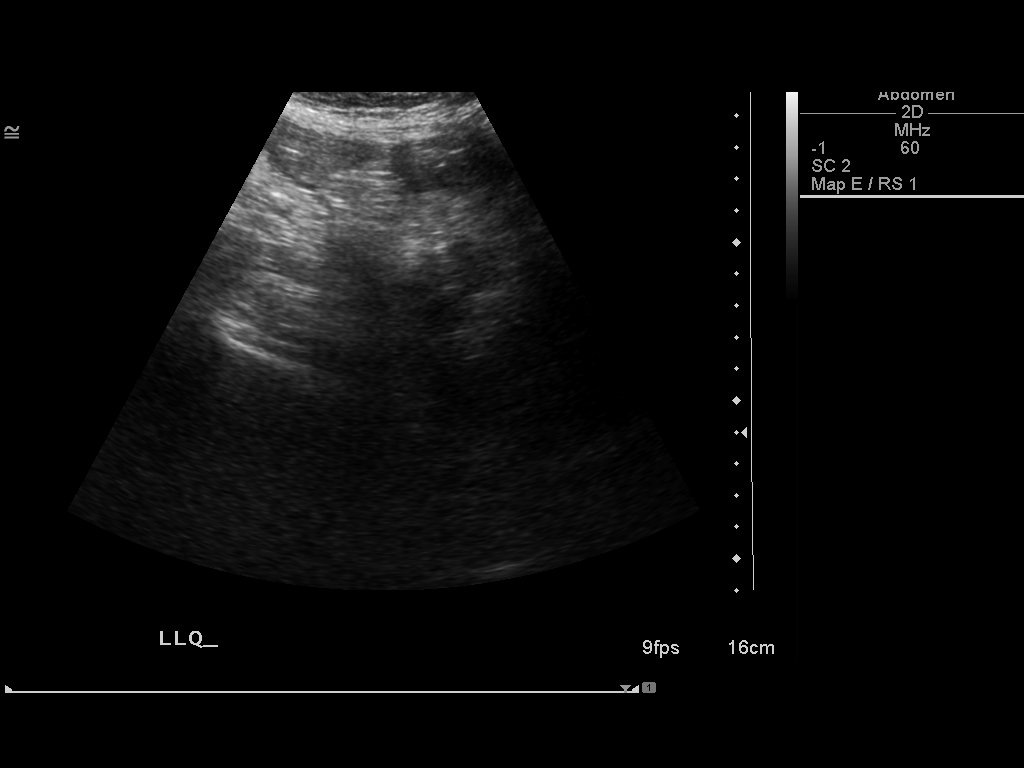
[im 7/7]
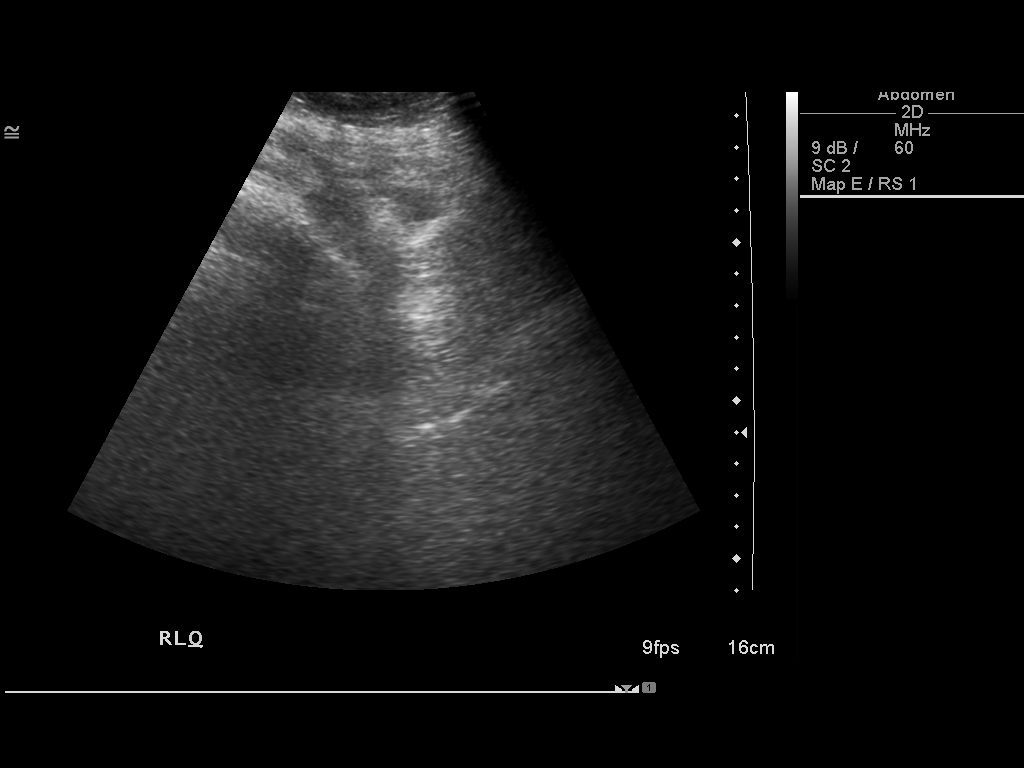

[7 of 7 positions shown; findings below may reference images not displayed]

An ultrasound guided paracentesis was thoroughly discussed with the
patient and questions answered.  The benefits, risks, alternatives
and complications were also discussed.  The patient understands and
wishes to proceed with the procedure.  Written consent was
obtained.

Ultrasound was performed to localize and mark an adequate pocket of
fluid in the right lower quadrant of the abdomen.  The area was
then prepped and draped in the normal sterile fashion.  1%
Lidocaine was used for local anesthesia.  Under ultrasound guidance
a 19 gauge Yueh catheter was introduced.  Paracentesis was
performed.  The catheter was removed and a dressing applied.

Complications:  None
FINDINGS: A total of approximately 4 liters of clear yellow fluid
was removed.  A fluid sample was not sent for laboratory analysis.
IMPRESSION: Successful ultrasound guided paracentesis yielding 4 liters of
ascites.

Read by Quirijn Amazigh

## 2013-08-22 ENCOUNTER — Other Ambulatory Visit: Payer: Self-pay | Admitting: Internal Medicine

## 2013-08-22 ENCOUNTER — Ambulatory Visit (INDEPENDENT_AMBULATORY_CARE_PROVIDER_SITE_OTHER): Payer: PRIVATE HEALTH INSURANCE | Admitting: Cardiology

## 2013-08-22 ENCOUNTER — Encounter: Payer: Self-pay | Admitting: Cardiology

## 2013-08-22 VITALS — BP 123/71 | HR 89 | Ht 66.0 in | Wt 157.0 lb

## 2013-08-22 DIAGNOSIS — N179 Acute kidney failure, unspecified: Secondary | ICD-10-CM

## 2013-08-22 DIAGNOSIS — N189 Chronic kidney disease, unspecified: Secondary | ICD-10-CM

## 2013-08-22 DIAGNOSIS — E785 Hyperlipidemia, unspecified: Secondary | ICD-10-CM

## 2013-08-22 DIAGNOSIS — E876 Hypokalemia: Secondary | ICD-10-CM

## 2013-08-22 DIAGNOSIS — I5022 Chronic systolic (congestive) heart failure: Secondary | ICD-10-CM

## 2013-08-22 DIAGNOSIS — I2510 Atherosclerotic heart disease of native coronary artery without angina pectoris: Secondary | ICD-10-CM

## 2013-08-22 DIAGNOSIS — I509 Heart failure, unspecified: Secondary | ICD-10-CM

## 2013-08-22 MED ORDER — FUROSEMIDE 40 MG PO TABS: ORAL_TABLET | ORAL | Status: DC

## 2013-08-22 NOTE — Patient Instructions (Signed)
Increase lasix (furosemide) to 60mg  two times a day. This will be 1 and 1/2 of a 40mg  tablet two times a day.   Your physician recommends that you have lab work today--BMET/Lipid profile.   Your physician recommends that you return for lab work in: 2 weeks--BMET. I have given you an order for this. Please fax the results to Dr McLean--[phone]  You have been referred to Dr Tyrone SageGerhardt for evaluation of protruding sternotomy wire.    Your physician recommends that you schedule a follow-up appointment in: 2 months with Dr Shirlee LatchMcLean in the [facility].

## 2013-08-23 ENCOUNTER — Telehealth: Payer: Self-pay

## 2013-08-23 ENCOUNTER — Telehealth: Payer: Self-pay | Admitting: Cardiology

## 2013-08-23 LAB — BASIC METABOLIC PANEL
BUN: 25 mg/dL — ABNORMAL HIGH (ref 6–23)
CO2: 31 meq/L (ref 19–32)
Calcium: 9 mg/dL (ref 8.4–10.5)
Chloride: 103 mEq/L (ref 96–112)
Creatinine, Ser: 1.3 mg/dL (ref 0.4–1.5)
GFR: 57.04 mL/min — AB (ref >60.00)
GLUCOSE: 149 mg/dL — AB (ref 70–99)
Potassium: 3.8 mEq/L (ref 3.5–5.1)
SODIUM: 140 meq/L (ref 135–145)

## 2013-08-23 LAB — LIPID PANEL
Cholesterol: 89 mg/dL (ref 0–200)
HDL: 34.4 mg/dL — AB (ref >39.00)
LDL Cholesterol: 44 mg/dL (ref 0–99)
Total CHOL/HDL Ratio: 3
Triglycerides: 55 mg/dL (ref 0.0–149.0)
VLDL: 11 mg/dL (ref 0.0–40.0)

## 2013-08-23 NOTE — Telephone Encounter (Signed)
Follow up     Pt calling back as to why his appt with surgeon - Dr. Tyrone SageGerhardt has not been set up . From office visit on 3/4 .

## 2013-08-23 NOTE — Telephone Encounter (Signed)
Per [facility] office Coordinator/ [facility]: We received the referral on Mr. Raymond KohBarbrey for his protruding sternal wires. At this time we are unable to schedule his appointment due to missing information in EPIC. Dr. Alford HighlandMcLean's note from the visit yesterday is not complete. If you could please let the patient as well as Raymond LatchMcLean know. As soon as the note is in EPIC the appointment will be arranged.  Pt is aware that the [facility] office will call him for an appointment after getting the information needed.

## 2013-08-23 NOTE — Telephone Encounter (Signed)
Attempted to call pt x 2 home phone is not set up to receive message at this time.

## 2013-08-23 NOTE — Telephone Encounter (Signed)
Pt called regarding the referral for his protruding sternal wires.  At this time we are unable to schedule his appointment due to missing information in EPIC.  Dr. Alford HighlandMcLean's note from the visit yesterday is not completed. As soon as the note is in EPIC the appointment will be arranged.

## 2013-08-23 NOTE — Telephone Encounter (Signed)
New problem   Pt stated someone need to call him to sched an appt with heart surgeon. Please call pt.

## 2013-08-23 NOTE — Progress Notes (Signed)
Patient ID: [name], male   DOB: Nov 30, 1945, 68 y.o.   MRN: [id number] PCP Dr. 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  I initially saw him in the hospital 3/14.  He was admitted with acute on chronic systolic CHF.  Cardiac MRI showed EF 37% (echo was difficult study).  He was started on milrinone and diuresed while in the hospital.  RHC on milrinone showed relatively compensated filling pressures and he was converted over to po Lasix.  He was noted to have significant ascites and had a therapeutic paracentesis while in the hospital.  Imaging showed cirrhosis. Hepatitis panel and AFP were negative.  He also was anemic and was followed by GI with EGD and APC of GAVE.  I saw him on 3/26, and he had re-developed significant abdominal swelling.  I sent him for a 2nd US-guided paracentesis which was done on 3/28 with removal of 4.3 L ascites.  On 3/30, he was re-admitted with ongoing dyspnea and RUQ pain.  He had a workup for cholecystitis which showed cholelithiasis but not cholecystitis. He continued to have serial paracenteses in 2014 for ascites but has not had one recently.  Ascites thought to be predominantly cardiac-driven.    Since his last appointment here, [name] has been stable.  He stopped Coreg due to low blood pressure and dizziness.  This has resolved.  He has no abdominal swelling.  He is unsteady on his feet and uses a walker.  He is short of breath chronically after walking 100 feet.  No orthopnea.  No BRBPR.  He has also had a protruding sternal wire for a number of weeks.  It has been red and draining.  He has taken antibiotics.   ECG: NSR, LAFB, old inferior MI, old anterior MI.   Labs (3/14): K 4, creatinine 2.3, HCT 25.7 Labs (4/14): K 3.2, creatinine 1.97, LFTs normal, albumin 2.1, HCT 25.9 Labs (5/14): K 4.5, creatinine 1.4 Labs (5/14): LDL 32, HDL 37 Labs (6/14): K 4.2, creatinine  1.5, LFTs normal   Past Medical History:  1. Type II diabetes  2. HTN  3. Hyperlipidemia  4. CAD:  - CABG 1999  - PCI 2007 to SVG-OM  - Last cath 2008 showed patent LIMA-LAD, SVG-OM, and SVG-PDA. Patient had PTCA to PLV at that time.  5. Systolic CHF: Echo in 2009 showed EF 50-55%. ? EF 40% on echo subsequently in [place]. Echo in 3/14 at [facility] was a very difficult study.  Cardiac MRI (without contrast given elevated creatinine) showed EF 37% with mid-apical anterior, anteroseptal, and inferior moderate to severe hypokinesis.  RHC (3/14) with mean RA 7, PA 31/19, mean PCWP 21, CI 3.66 (on milrinone).   6. Obesity  7. CKD  8. 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  No h/o ETOH.  Hepatitis panel and AFP negative in 3/14. He has had ascites requiring multiple paracenteses.  9. GAVE with anemia: EGD with APC in 3/14.  He is on PPI.  10. CKD 11. H/o CEA: carotid dopplers (4/14) with 40-59% RICA stenosis.   SH: Married, retired, lives in [place].  He no longer smokes.   FH: CAD  ROS: All systems reviewed and negative except as per HPI.   Current Outpatient Prescriptions  Medication Sig Dispense Refill  . aspirin EC 81 MG tablet Take 81 mg by mouth daily.      . B Complex-C-Folic Acid (  B COMPLEX-VITAMIN C-FOLIC ACID) 1 MG tablet Take 1 tablet by mouth daily.       . cholecalciferol (VITAMIN D) 1000 UNITS tablet Take 1,000 Units by mouth daily.      . Cyanocobalamin (VITAMIN B-12 PO) Take 1 tablet by mouth daily.      Marland Kitchen docusate sodium (COLACE) 100 MG capsule Take 100 mg by mouth at bedtime.      . insulin glargine (LANTUS) 100 UNIT/ML injection Inject 20 Units into the skin nightly.      . Multiple Vitamin (MULITIVITAMIN WITH MINERALS) TABS Take 1 tablet by mouth daily.      . nitroGLYCERIN (NITROSTAT) 0.4 MG SL tablet Place 0.4 mg under the tongue every 5 (five) minutes as needed for chest pain. If pain continues after 3 doses call 911      . ONE TOUCH ULTRA TEST test strip        . oxyCODONE (OXY IR/ROXICODONE) 5 MG immediate release tablet Take 5 mg by mouth every 4 (four) hours as needed for pain.      . pantoprazole (PROTONIX) 40 MG tablet TAKE 1 TABLET TWICE DAILY  60 tablet  2  . polyethylene glycol (MIRALAX / GLYCOLAX) packet Take 17 g by mouth daily as needed (for constipation).       Melburn Popper ointment daily as needed.      . simvastatin (ZOCOR) 20 MG tablet 20 mg. Take 1 tablet (20 mg total) by mouth nightly.      Marland Kitchen spironolactone (ALDACTONE) 25 MG tablet Take 25 mg by mouth once.      . sucralfate (CARAFATE) 1 G tablet Take 1 tablet (1 g total) by mouth 5 (five) times daily.  150 tablet  0  . ferrous sulfate 325 (65 FE) MG tablet Take 325 mg by mouth daily.       . furosemide (LASIX) 40 MG tablet 1 and 1/2 tablets (60mg ) two times a day  90 tablet  3   No current facility-administered medications for this visit.    BP 123/71  Pulse 89  Ht 5\' 6"  (1.676 m)  Wt 71.215 kg (157 lb)  BMI 25.35 kg/m2 General: NAD Neck: JVP 8-9 cm, no thyromegaly or thyroid nodule.  Lungs: Crackles 1/3 up lung fields bilaterally.  CV: Nondisplaced PMI.  Heart regular S1/S2, no S3/S4, 2/6 early SEM.  1+ edema 1/2 up lower legs bilaterally.  No carotid bruit.   Abdomen: Soft, nontender, no hepatosplenomegaly, no distention.   Neurologic: Alert and oriented x 3.  Psych: Normal affect. Extremities: No clubbing or cyanosis.   Assessment/Plan: 1. CHF: Chronic systolic CHF, NYHA class III symptoms. EF 37% with wall motion abnormalities on cardiac MRI in 3/14 (echo was difficult study).  He does appear volume overloaded on exam.  - Holding off on ACEI with CKD.  He did not tolerate hydralazine/nitrates due to hypotension.  - He is off Coreg due to orthostatic symptoms. SBP in 90s at home often, so will not restart at this time.  - Continue spironolactone at current dose.  - Increase Lasix to 60 mg bid. - BMET today and in 2 wks.  2. Cirrhosis: Suspect NASH versus secondary to  right heart failure.  Workup negative for viral hepatitis and never a drinker.  He has required multiple paracenteses due to ascites and abdominal distention.  Spironolactone initiation seems to have helped control ascites.  He has not required a paracentesis in months.  3. Anemia: Suspect upper GI bleeding from GAVE.  Had APC in hospital.  CBC has been stable.  Has followup with GI.  4. CKD: Suspect there is a cardiorenal component.  Will have to follow creatinine carefully with diuresis. He follows with nephrology.  5. CAD: No chest pain.  At this point, I do not think there is indication for functional study. He would not be a good cath candidate given his creatinine.   - Continue ASA, statin.   6. Hyperlipidemia: Good lipids on statin in 5/14. Repeat lipids today.  7. Carotid stenosis: Repeat dopplers in 4/15.    8. Protruding sternal wire with drainage and redness. He has been on antibiotics.  He wants to go back to see Dr. Tyrone Sage.  I will arrange.    Marca Ancona 08/23/2013

## 2013-08-24 NOTE — Progress Notes (Signed)
Pt.notified

## 2013-08-24 NOTE — Telephone Encounter (Signed)
Appt with Dr Tyrone SageGerhardt   has [facility] per [facility].

## 2013-08-27 ENCOUNTER — Encounter: Payer: PRIVATE HEALTH INSURANCE | Admitting: Cardiothoracic Surgery

## 2013-08-27 ENCOUNTER — Telehealth: Payer: Self-pay | Admitting: Cardiology

## 2013-08-27 NOTE — Telephone Encounter (Signed)
New message    Patient calling C/O swelling in right leg.

## 2013-08-27 NOTE — Telephone Encounter (Signed)
Since office visit with Dr Shirlee LatchMcLean 08/22/13 pt states breathing is better, no weight gain, left leg swelling improved but right leg swelling is no better and may be a little worse. Pt states he is avoiding salt and keeping his right leg elevated some during the day. I will forward to Dr Shirlee LatchMcLean for review.

## 2013-08-28 NOTE — Telephone Encounter (Signed)
Reviewed with Dr Era SkeenMcLean--no changes recommended at this time.

## 2013-09-11 IMAGING — US US PARACENTESIS
1 series · 10 of 10 positions shown · non-contrast
Comparison: none

CLINICAL DATA: CHF, cirrhosis, chronic kidney disease, recurrent
ascites.  Request is made for therapeutic paracentesis.

[Series 1: us paracentesis · 0.32mm/px · 10 of 10 slices shown]
[im 1/10]
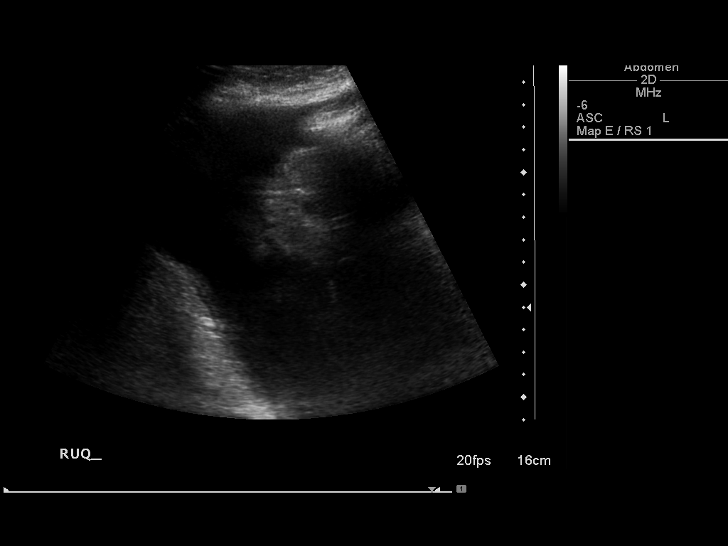
[im 2/10]
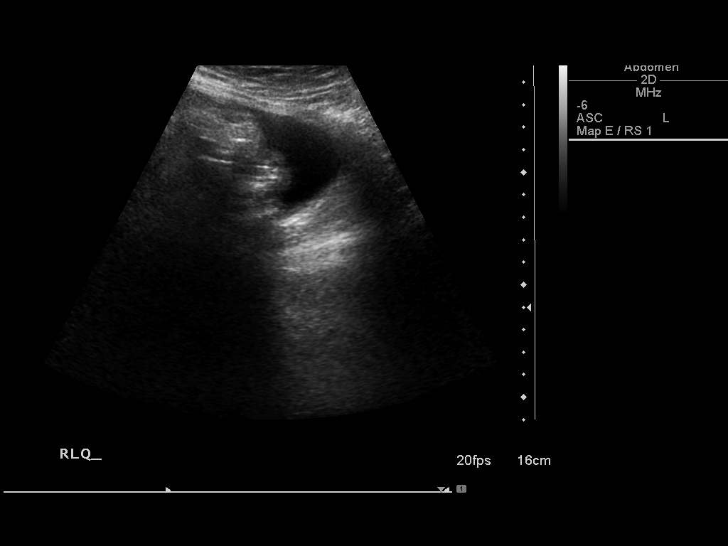
[im 3/10]
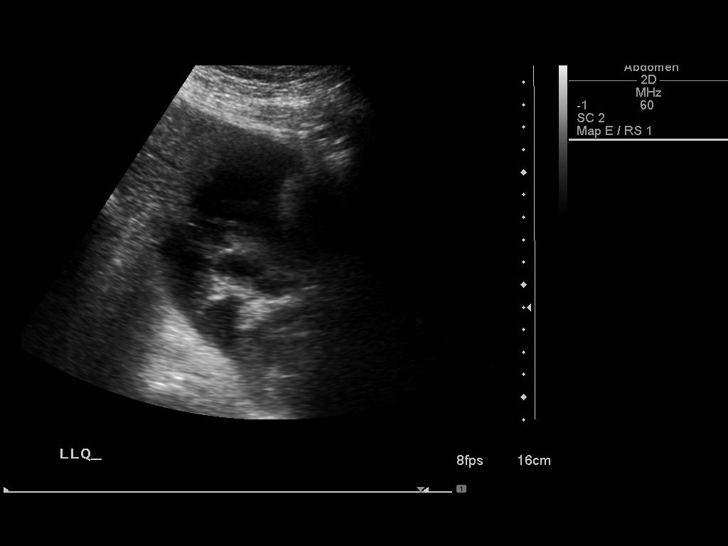
[im 4/10]
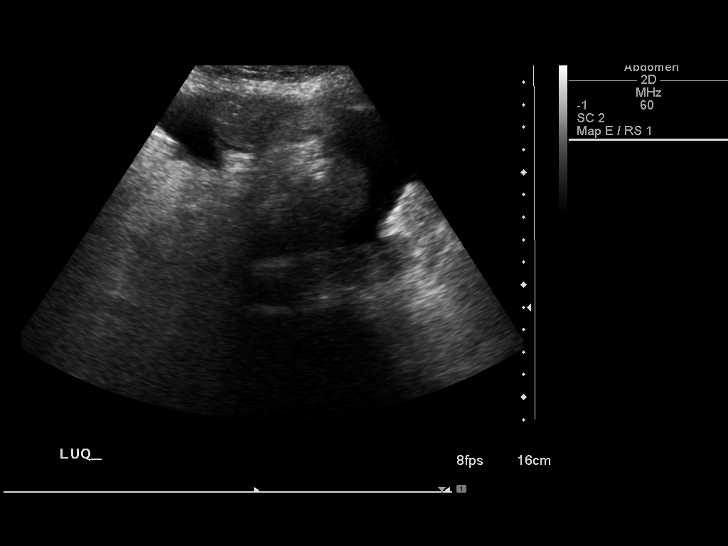
[im 5/10]
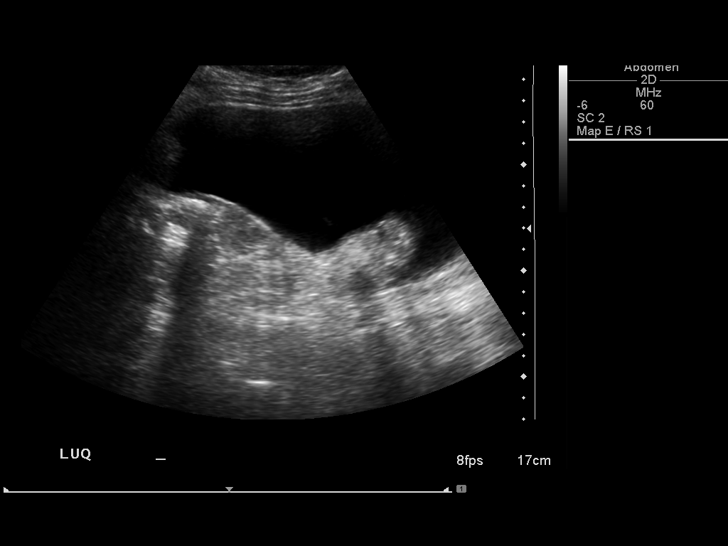
[im 6/10]
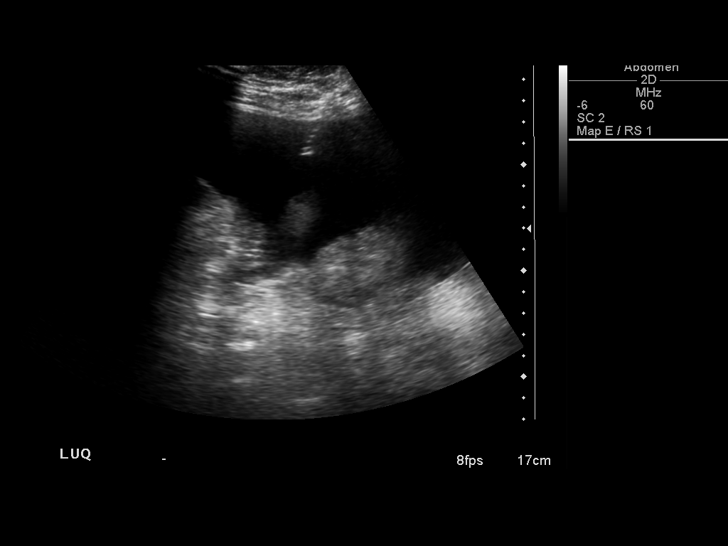
[im 7/10]
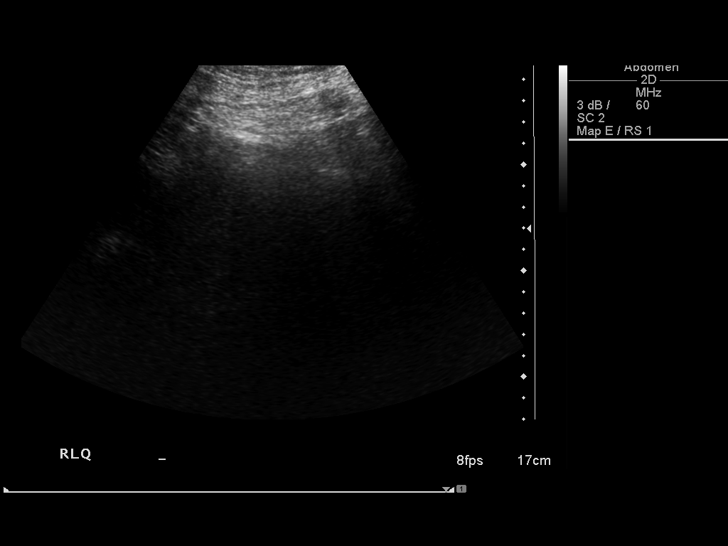
[im 8/10]
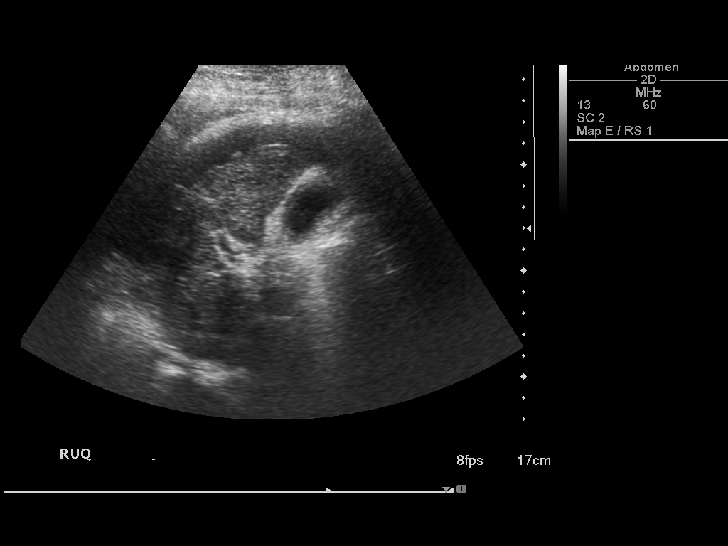
[im 9/10]
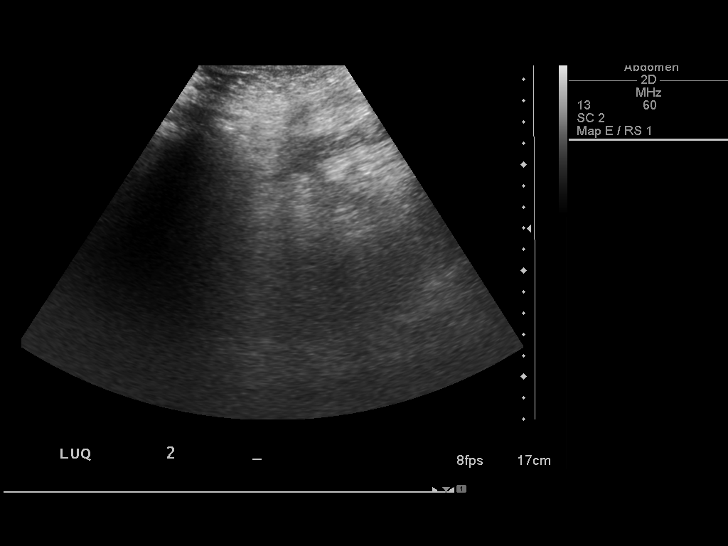
[im 10/10]
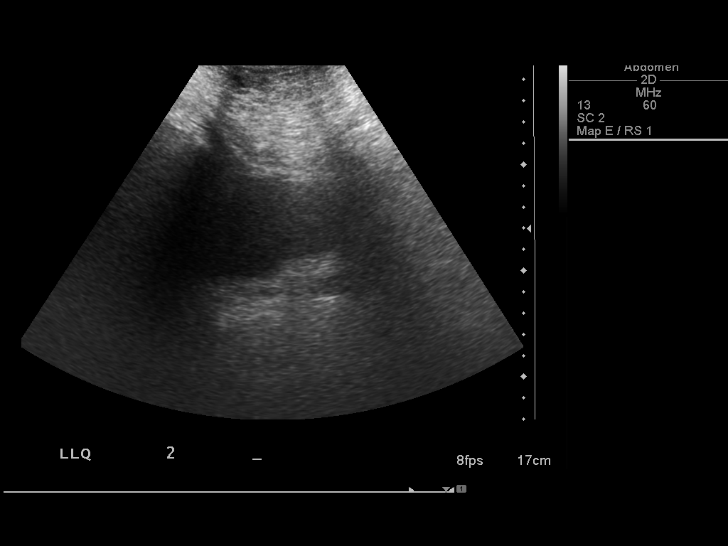

[10 of 10 positions shown; findings below may reference images not displayed]

ULTRASOUND GUIDED THERAPEUTIC  PARACENTESIS

An ultrasound guided paracentesis was thoroughly discussed with the
patient and questions answered.  The benefits, risks, alternatives
and complications were also discussed.  The patient understands and
wishes to proceed with the procedure.  Written consent was
obtained.

Ultrasound was performed to localize and mark an adequate pocket of
fluid in the left lower quadrant of the abdomen.  The area was then
prepped and draped in the normal sterile fashion.  1% Lidocaine was
used for local anesthesia.  Under ultrasound guidance a 19 gauge
Yueh catheter was introduced.  Paracentesis was performed.  The
catheter was removed and a dressing applied.

Complications:  none
FINDINGS: A total of approximately 2 liters of clear, yellow fluid
was removed.
IMPRESSION: Successful ultrasound guided therapeutic  paracentesis yielding 2
liters of ascites.

## 2013-09-17 ENCOUNTER — Telehealth: Payer: Self-pay | Admitting: Cardiology

## 2013-09-17 NOTE — Telephone Encounter (Signed)
New problem    Pt need to talk to nurse concerning a problem he as, but wouldn't say what. please call pt.

## 2013-09-17 NOTE — Telephone Encounter (Signed)
Tried to call patient x 2, not accepting calls at this time per recorder. Called work number, wrong number.

## 2013-09-18 NOTE — Telephone Encounter (Signed)
"  Not accepting calls at this time."

## 2013-09-19 NOTE — Telephone Encounter (Signed)
"  Not accepting calls at this time."

## 2013-09-20 ENCOUNTER — Ambulatory Visit
Admission: RE | Admit: 2013-09-20 | Discharge: 2013-09-20 | Disposition: A | Discharge status: Home / Self Care | Payer: PRIVATE HEALTH INSURANCE | Source: Ambulatory Visit | Attending: Cardiothoracic Surgery | Admitting: Cardiothoracic Surgery

## 2013-09-20 ENCOUNTER — Institutional Professional Consult (permissible substitution) (INDEPENDENT_AMBULATORY_CARE_PROVIDER_SITE_OTHER): Payer: PRIVATE HEALTH INSURANCE | Admitting: Cardiothoracic Surgery

## 2013-09-20 ENCOUNTER — Other Ambulatory Visit: Payer: Self-pay | Admitting: *Deleted

## 2013-09-20 ENCOUNTER — Encounter: Payer: Self-pay | Admitting: Cardiothoracic Surgery

## 2013-09-20 ENCOUNTER — Inpatient Hospital Stay
Admission: RE | Admit: 2013-09-20 | Discharge: 2013-09-20 | Disposition: A | Discharge status: Home / Self Care | Payer: Self-pay | Source: Ambulatory Visit | Attending: Cardiothoracic Surgery | Admitting: Cardiothoracic Surgery

## 2013-09-20 ENCOUNTER — Other Ambulatory Visit: Payer: Self-pay

## 2013-09-20 VITALS — BP 130/72 | HR 96 | Resp 20 | Ht 66.0 in | Wt 156.0 lb

## 2013-09-20 DIAGNOSIS — T8189XA Other complications of procedures, not elsewhere classified, initial encounter: Secondary | ICD-10-CM

## 2013-09-20 DIAGNOSIS — Z951 Presence of aortocoronary bypass graft: Secondary | ICD-10-CM

## 2013-09-20 DIAGNOSIS — IMO0002 Reserved for concepts with insufficient information to code with codable children: Secondary | ICD-10-CM

## 2013-09-20 NOTE — Progress Notes (Signed)
[address]       [place] [address]             [phone]                    [name] [facility] Medical Record #[id number] Date of Birth: 1946/03/02  Referring: Laurey Morale, MD Primary Care: Dina Rich, MD  Chief Complaint:    Chief Complaint  Patient presents with  . Incisional Pain    C/O proturding sternal wires, HX of CABG .sent for cxr    History of Present Illness:    [name] 68 y.o. male is seen in the office  today for protruding sternal wire. Patient had coronary artery bypass grafting by me in 1993 and again in 1999. He has had significant weight loss recently and her at least several months a sternal wire which is protruded through the skin      Current Activity/ Functional Status:  Patient is not independent with mobility/ambulation, transfers, ADL's, IADL's.   Zubrod Score: At the time of surgery this patient's most appropriate activity status/level should be described as: []     0    Normal activity, no symptoms []     1    Restricted in physical strenuous activity but ambulatory, able to do out light work []     2    Ambulatory and capable of self care, unable to do work activities, up and about               >50 % of waking hours                              [x]     3    Only limited self care, in bed greater than 50% of waking hours []     4    Completely disabled, no self care, confined to bed or chair []     5    Moribund   Past Medical History  Diagnosis Date  . Coronary artery disease   . Diabetes mellitus   . Hypercholesteremia   . Shortness of breath   . CHF (congestive heart failure)   . HTN (hypertension)   . GERD (gastroesophageal reflux disease)   . Cirrhosis 09/09/2012  . 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 Gall stones 09/19/2012  . Chronic systolic CHF (congestive heart failure) 09/14/2012    Past Surgical History  Procedure Laterality Date  . Coronary artery bypass graft      . Carotid endarterectomy    . Cardiac catheterization    . Shoulder surgery    . Esophagogastroduodenoscopy N/A 09/07/2012    Procedure: ESOPHAGOGASTRODUODENOSCOPY (EGD);  Surgeon: Beverley Fiedler, MD;  Location: [facility] ENDOSCOPY;  Service: Gastroenterology;  Laterality: N/A;  . Esophagogastroduodenoscopy N/A 09/08/2012    Procedure: ESOPHAGOGASTRODUODENOSCOPY (EGD);  Surgeon: Beverley Fiedler, MD;  Location: [facility] ENDOSCOPY;  Service: Gastroenterology;  Laterality: N/A;    Family History  Problem Relation Age of Onset  . Heart disease Father   . Heart disease Brother   . Diabetes Brother   . Kidney disease Brother     diabetes    History   Social History  . Marital Status: Married    Spouse Name: N/A    Number of Children: 3  . Years of Education: N/A   Occupational History  . retired    Social History Main  Topics  . Smoking status: Never Smoker   . Smokeless tobacco: Never Used  . Alcohol Use: No  . Drug Use: No  . Sexual Activity: No   Other Topics Concern  . Not on file   Social History Narrative  . No narrative on file    History  Smoking status  . Never Smoker   Smokeless tobacco  . Never Used   History  Alcohol Use No     Allergies  Allergen Reactions  . Morphine And Related Other (See Comments)    Shakes & hallucinations    Current Outpatient Prescriptions  Medication Sig Dispense Refill  . aspirin EC 81 MG tablet Take 81 mg by mouth daily.      . B Complex-C-Folic Acid (B COMPLEX-VITAMIN C-FOLIC ACID) 1 MG tablet Take 1 tablet by mouth daily.       . cholecalciferol (VITAMIN D) 1000 UNITS tablet Take 1,000 Units by mouth daily.      . Cyanocobalamin (VITAMIN B-12 PO) Take 1 tablet by mouth daily.      Marland Kitchen docusate sodium (COLACE) 100 MG capsule Take 100 mg by mouth at bedtime.      . furosemide (LASIX) 40 MG tablet 1 and 1/2 tablets (60mg ) two times a day  90 tablet  3  . insulin glargine (LANTUS) 100 UNIT/ML injection Inject 20 Units into the skin nightly.       . Multiple Vitamin (MULITIVITAMIN WITH MINERALS) TABS Take 1 tablet by mouth daily.      . nitroGLYCERIN (NITROSTAT) 0.4 MG SL tablet Place 0.4 mg under the tongue every 5 (five) minutes as needed for chest pain. If pain continues after 3 doses call 911      . ONE TOUCH ULTRA TEST test strip       . oxyCODONE (OXY IR/ROXICODONE) 5 MG immediate release tablet Take 5 mg by mouth every 4 (four) hours as needed for pain.      . pantoprazole (PROTONIX) 40 MG tablet TAKE 1 TABLET TWICE DAILY  60 tablet  2  . polyethylene glycol (MIRALAX / GLYCOLAX) packet Take 17 g by mouth daily as needed (for constipation).       Melburn Popper ointment daily as needed.      . simvastatin (ZOCOR) 20 MG tablet 20 mg. Take 1 tablet (20 mg total) by mouth nightly.      Marland Kitchen spironolactone (ALDACTONE) 25 MG tablet Take 25 mg by mouth once.      . sucralfate (CARAFATE) 1 G tablet Take 1 tablet (1 g total) by mouth 5 (five) times daily.  150 tablet  0  . ferrous sulfate 325 (65 FE) MG tablet Take 325 mg by mouth daily.        No current facility-administered medications for this visit.     Review of Systems:     Cardiac Review of Systems: Y or N  Chest Pain [  y  ]  Resting SOB [ y  ] Exertional SOB  [ y ]  Pollyann Kennedy Cove.Etienne  ]   Pedal Edema Cove.Etienne   ]    Palpitations Cove.Etienne  ] Syncope  Milo.Brash  ]   Presyncope [ n  ]  General Review of Systems: [Y] = yes [  ]=no Constitional: recent weight change [  ];  Wt loss over the last 3 months [   ] anorexia [  ]; fatigue [  ]; nausea [  ]; night sweats [  ]; fever [  ];  or chills [  ];          Dental: poor dentition[  ]; Last Dentist visit:   Eye : blurred vision [  ]; diplopia [   ]; vision changes [  ];  Amaurosis fugax[  ]; Resp: cough [  ];  wheezing[  ];  hemoptysis[  ]; shortness of breath[  ]; paroxysmal nocturnal dyspnea[  ]; dyspnea on exertion[  ]; or orthopnea[  ];  GI:  gallstones[  ], vomiting[  ];  dysphagia[  ]; melena[  ];  hematochezia [  ]; heartburn[  ];   Hx of  Colonoscopy[   ]; GU: kidney stones [  ]; hematuria[  ];   dysuria [  ];  nocturia[  ];  history of     obstruction [  ]; urinary frequency [  ]             Skin: rash, swelling[  ];, hair loss[  ];  peripheral edema[  ];  or itching[  ]; Musculosketetal: myalgias[  ];  joint swelling[  ];  joint erythema[  ];  joint pain[  ];  back pain[  ];  Heme/Lymph: bruising[  ];  bleeding[  ];  anemia[  ];  Neuro: TIA[  ];  headaches[  ];  stroke[  ];  vertigo[  ];  seizures[  ];   paresthesias[  ];  difficulty walking[  ];  Psych:depression[  ]; anxiety[  ];  Endocrine: diabetes[  ];  thyroid dysfunction[  ];  Immunizations: Flu up to date [  ]; Pneumococcal up to date [  ];  Other:  Physical Exam: BP 130/72  Pulse 96  Resp 20  Ht 5\' 6"  (1.676 m)  Wt 156 lb (70.761 kg)  BMI 25.19 kg/m2  SpO2 96%  PHYSICAL EXAMINATION:  General appearance: alert, cooperative, appears older than stated age, cachectic and no distress Neurologic: intact Heart: regular rate and rhythm, S1, S2 normal, no murmur, click, rub or gallop Lungs: diminished breath sounds bibasilar Abdomen: soft, non-tender; bowel sounds normal; no masses,  no organomegaly Extremities: extremities normal, atraumatic, no cyanosis or edema Wound: Sternal wound is examined in the midportion is a sternal wire which is pursuing to the skin with surrounding erythema   Diagnostic Studies & Laboratory data:     Recent Radiology Findings:   Dg Chest 2 View  09/20/2013   CLINICAL DATA:  Protrusion of sternal wires, some chest pain  EXAM: CHEST  2 VIEW  COMPARISON:  Chest x-ray of 09/17/2012, and CT chest of 09/17/2012  FINDINGS: Chronic changes are stable with pleural thickening at the right lung base and blunted right costophrenic angle. There is volume loss on the right is well. Multiple right rib fractures again are noted as demonstrated on prior CT at least involving the right sixth, seventh, eighth, ninth, and tenth ribs laterally. No pneumothorax is noted.  The left lung is clear. Mild cardiomegaly is stable. At least 1 of the median sternotomy sutures does extend anteriorly toward the skin surface.  IMPRESSION: 1. At least 1 sternal suture wire does extend toward the skin surface anteriorly. 2. Probable chronic change at the right lung base with pleural thickening and volume loss. 3. Multiple right rib fractures as noted on prior CT.   Electronically Signed   By: Dwyane DeePaul  Barry M.D.   On: 09/20/2013 16:00   Dg Chest 2 View  09/20/2013   This is the back office imaging final text. See progress note  for physician's narrative and interpretation.      Recent Lab Findings: Lab Results  Component Value Date   WBC 8.1 10/27/2012   HGB 8.6 Repeated and verified X2.* 10/27/2012   HCT 26.4* 10/27/2012   PLT 333.0 10/27/2012   GLUCOSE 149* 08/22/2013   CHOL 89 08/22/2013   TRIG 55.0 08/22/2013   HDL 34.40* 08/22/2013   LDLCALC 44 08/22/2013   ALT 8 09/21/2012   AST 20 09/21/2012   NA 140 08/22/2013   K 3.8 08/22/2013   CL 103 08/22/2013   CREATININE 1.3 08/22/2013   BUN 25* 08/22/2013   CO2 31 08/22/2013   TSH 1.237 09/01/2012   INR 0.97 09/18/2012   HGBA1C 6.3* 09/17/2012      Assessment / Plan:      Patient now moderately cachectic and with protruding sternal wire with necrosis of the overlying skin. I recommended to the patient that we remove the protruding sternal wire he is agreeable with this we will arrange for Monday, April 6.      .  Delight Ovens MD      [address] [place] [place] [address] Office [phone]   Beeper [phone]  09/20/2013 5:14 PM

## 2013-09-21 ENCOUNTER — Encounter (HOSPITAL_COMMUNITY): Payer: Self-pay | Admitting: *Deleted

## 2013-09-23 MED ORDER — DEXTROSE 5 % IV SOLN
1.5000 g | INTRAVENOUS | Status: AC
  Administered 2013-09-24: 1.5 g via INTRAVENOUS
  Filled 2013-09-23: qty 1.5

## 2013-09-24 ENCOUNTER — Encounter (HOSPITAL_COMMUNITY): Payer: PRIVATE HEALTH INSURANCE | Admitting: Anesthesiology

## 2013-09-24 ENCOUNTER — Ambulatory Visit (HOSPITAL_COMMUNITY): Payer: PRIVATE HEALTH INSURANCE

## 2013-09-24 ENCOUNTER — Ambulatory Visit (HOSPITAL_COMMUNITY): Payer: PRIVATE HEALTH INSURANCE | Admitting: Anesthesiology

## 2013-09-24 ENCOUNTER — Ambulatory Visit (HOSPITAL_COMMUNITY)
Admission: RE | Admit: 2013-09-24 | Discharge: 2013-09-24 | Disposition: A | Discharge status: Home / Self Care | Payer: PRIVATE HEALTH INSURANCE | Source: Ambulatory Visit | Attending: Cardiothoracic Surgery | Admitting: Cardiothoracic Surgery

## 2013-09-24 ENCOUNTER — Encounter (HOSPITAL_COMMUNITY): Payer: Self-pay | Admitting: Certified Registered Nurse Anesthetist

## 2013-09-24 ENCOUNTER — Encounter (HOSPITAL_COMMUNITY)
Admission: RE | Disposition: A | Discharge status: Home / Self Care | Payer: Self-pay | Source: Ambulatory Visit | Attending: Cardiothoracic Surgery

## 2013-09-24 DIAGNOSIS — K7460 Unspecified cirrhosis of liver: Secondary | ICD-10-CM | POA: Insufficient documentation

## 2013-09-24 DIAGNOSIS — G4730 Sleep apnea, unspecified: Secondary | ICD-10-CM | POA: Insufficient documentation

## 2013-09-24 DIAGNOSIS — D509 Iron deficiency anemia, unspecified: Secondary | ICD-10-CM | POA: Insufficient documentation

## 2013-09-24 DIAGNOSIS — I5022 Chronic systolic (congestive) heart failure: Secondary | ICD-10-CM | POA: Insufficient documentation

## 2013-09-24 DIAGNOSIS — I10 Essential (primary) hypertension: Secondary | ICD-10-CM | POA: Insufficient documentation

## 2013-09-24 DIAGNOSIS — E119 Type 2 diabetes mellitus without complications: Secondary | ICD-10-CM | POA: Insufficient documentation

## 2013-09-24 DIAGNOSIS — R188 Other ascites: Secondary | ICD-10-CM | POA: Insufficient documentation

## 2013-09-24 DIAGNOSIS — Y832 Surgical operation with anastomosis, bypass or graft as the cause of abnormal reaction of the patient, or of later complication, without mention of misadventure at the time of the procedure: Secondary | ICD-10-CM | POA: Insufficient documentation

## 2013-09-24 DIAGNOSIS — T8189XA Other complications of procedures, not elsewhere classified, initial encounter: Secondary | ICD-10-CM

## 2013-09-24 DIAGNOSIS — E7800 Pure hypercholesterolemia, unspecified: Secondary | ICD-10-CM | POA: Insufficient documentation

## 2013-09-24 DIAGNOSIS — I2510 Atherosclerotic heart disease of native coronary artery without angina pectoris: Secondary | ICD-10-CM | POA: Insufficient documentation

## 2013-09-24 DIAGNOSIS — K219 Gastro-esophageal reflux disease without esophagitis: Secondary | ICD-10-CM | POA: Insufficient documentation

## 2013-09-24 DIAGNOSIS — K31819 Angiodysplasia of stomach and duodenum without bleeding: Secondary | ICD-10-CM | POA: Insufficient documentation

## 2013-09-24 DIAGNOSIS — I509 Heart failure, unspecified: Secondary | ICD-10-CM | POA: Insufficient documentation

## 2013-09-24 DIAGNOSIS — Z794 Long term (current) use of insulin: Secondary | ICD-10-CM | POA: Insufficient documentation

## 2013-09-24 DIAGNOSIS — K5900 Constipation, unspecified: Secondary | ICD-10-CM | POA: Insufficient documentation

## 2013-09-24 DIAGNOSIS — T82218A Other mechanical complication of coronary artery bypass graft, initial encounter: Secondary | ICD-10-CM

## 2013-09-24 DIAGNOSIS — I252 Old myocardial infarction: Secondary | ICD-10-CM | POA: Insufficient documentation

## 2013-09-24 DIAGNOSIS — T8132XA Disruption of internal operation (surgical) wound, not elsewhere classified, initial encounter: Secondary | ICD-10-CM | POA: Insufficient documentation

## 2013-09-24 DIAGNOSIS — T81329A Deep disruption or dehiscence of operation wound, unspecified, initial encounter: Secondary | ICD-10-CM | POA: Insufficient documentation

## 2013-09-24 DIAGNOSIS — R64 Cachexia: Secondary | ICD-10-CM | POA: Insufficient documentation

## 2013-09-24 DIAGNOSIS — Z951 Presence of aortocoronary bypass graft: Secondary | ICD-10-CM | POA: Insufficient documentation

## 2013-09-24 HISTORY — PX: STERNAL WIRES REMOVAL: SHX2441

## 2013-09-24 HISTORY — DX: Acute myocardial infarction, unspecified: I21.9

## 2013-09-24 HISTORY — PX: WOUND DEBRIDEMENT: SHX247

## 2013-09-24 HISTORY — DX: Anemia, unspecified: D64.9

## 2013-09-24 HISTORY — DX: Pneumonia, unspecified organism: J18.9

## 2013-09-24 HISTORY — DX: Constipation, unspecified: K59.00

## 2013-09-24 HISTORY — DX: Sleep apnea, unspecified: G47.30

## 2013-09-24 LAB — COMPREHENSIVE METABOLIC PANEL
ALT: 10 U/L (ref 0–53)
AST: 20 U/L (ref 0–37)
Albumin: 2.7 g/dL — ABNORMAL LOW (ref 3.5–5.2)
Alkaline Phosphatase: 112 U/L (ref 39–117)
BUN: 36 mg/dL — ABNORMAL HIGH (ref 6–23)
CO2: 27 mEq/L (ref 19–32)
Calcium: 9 mg/dL (ref 8.4–10.5)
Chloride: 99 mEq/L (ref 96–112)
Creatinine, Ser: 1.45 mg/dL — ABNORMAL HIGH (ref 0.50–1.35)
GFR calc Af Amer: 56 mL/min — ABNORMAL LOW (ref >90)
GFR calc non Af Amer: 49 mL/min — ABNORMAL LOW (ref >90)
Glucose, Bld: 105 mg/dL — ABNORMAL HIGH (ref 70–99)
Potassium: 3.8 mEq/L (ref 3.7–5.3)
Sodium: 140 mEq/L (ref 137–147)
Total Bilirubin: 0.4 mg/dL (ref 0.3–1.2)
Total Protein: 6.5 g/dL (ref 6.0–8.3)

## 2013-09-24 LAB — GLUCOSE, CAPILLARY
Glucose-Capillary: 96 mg/dL (ref 70–99)
Glucose-Capillary: 92 mg/dL (ref 70–99)

## 2013-09-24 LAB — CBC
HCT: 28.9 % — ABNORMAL LOW (ref 39.0–52.0)
Hemoglobin: 9.2 g/dL — ABNORMAL LOW (ref 13.0–17.0)
MCH: 29.2 pg (ref 26.0–34.0)
MCHC: 31.8 g/dL (ref 30.0–36.0)
MCV: 91.7 fL (ref 78.0–100.0)
Platelets: 256 10*3/uL (ref 150–400)
RBC: 3.15 MIL/uL — ABNORMAL LOW (ref 4.22–5.81)
RDW: 15.6 % — ABNORMAL HIGH (ref 11.5–15.5)
WBC: 6.2 10*3/uL (ref 4.0–10.5)

## 2013-09-24 LAB — BLOOD GAS, ARTERIAL
Acid-Base Excess: 5.4 mmol/L — ABNORMAL HIGH (ref 0.0–2.0)
Bicarbonate: 29.1 mEq/L — ABNORMAL HIGH (ref 20.0–24.0)
Drawn by: 181601
FIO2: 21 %
O2 Saturation: 97.1 %
Patient temperature: 98.6
TCO2: 30.4 mmol/L (ref 0–100)
pCO2 arterial: 41.2 mmHg (ref 35.0–45.0)
pH, Arterial: 7.464 — ABNORMAL HIGH (ref 7.350–7.450)
pO2, Arterial: 125 mmHg — ABNORMAL HIGH (ref 80.0–100.0)

## 2013-09-24 LAB — PROTIME-INR
INR: 1.06 (ref 0.00–1.49)
Prothrombin Time: 13.6 seconds (ref 11.6–15.2)

## 2013-09-24 LAB — TYPE AND SCREEN
ABO/RH(D): O POS
Antibody Screen: NEGATIVE

## 2013-09-24 LAB — SURGICAL PCR SCREEN
MRSA, PCR: POSITIVE — AB
Staphylococcus aureus: POSITIVE — AB

## 2013-09-24 LAB — APTT: aPTT: 38 seconds — ABNORMAL HIGH (ref 24–37)

## 2013-09-24 LAB — ABO/RH: ABO/RH(D): O POS

## 2013-09-24 SURGERY — REMOVAL, STERNAL WIRE
Anesthesia: Monitor Anesthesia Care | Site: Chest

## 2013-09-24 MED ORDER — LIDOCAINE HCL (PF) 1 % IJ SOLN
INTRAMUSCULAR | Status: AC
  Filled 2013-09-24: qty 30

## 2013-09-24 MED ORDER — PROPOFOL 10 MG/ML IV BOLUS
INTRAVENOUS | Status: DC | PRN
  Administered 2013-09-24 (×3): 20 mg via INTRAVENOUS
  Administered 2013-09-24: 10 mg via INTRAVENOUS
  Administered 2013-09-24 (×2): 20 mg via INTRAVENOUS

## 2013-09-24 MED ORDER — ACETAMINOPHEN-CODEINE #2 300-15 MG PO TABS: 1.0000 | ORAL_TABLET | ORAL | Status: DC | PRN

## 2013-09-24 MED ORDER — PHENYLEPHRINE 40 MCG/ML (10ML) SYRINGE FOR IV PUSH (FOR BLOOD PRESSURE SUPPORT)
PREFILLED_SYRINGE | INTRAVENOUS | Status: AC
  Filled 2013-09-24: qty 10

## 2013-09-24 MED ORDER — MIDAZOLAM HCL 2 MG/2ML IJ SOLN
INTRAMUSCULAR | Status: AC
  Filled 2013-09-24: qty 2

## 2013-09-24 MED ORDER — FENTANYL CITRATE 0.05 MG/ML IJ SOLN: 50.0000 ug | Freq: Once | INTRAMUSCULAR | Status: DC

## 2013-09-24 MED ORDER — MUPIROCIN 2 % EX OINT
TOPICAL_OINTMENT | CUTANEOUS | Status: AC
  Filled 2013-09-24: qty 22

## 2013-09-24 MED ORDER — PHENYLEPHRINE HCL 10 MG/ML IJ SOLN
INTRAMUSCULAR | Status: DC | PRN
  Administered 2013-09-24 (×7): 80 ug via INTRAVENOUS
  Administered 2013-09-24: 120 ug via INTRAVENOUS
  Administered 2013-09-24: 80 ug via INTRAVENOUS

## 2013-09-24 MED ORDER — MIDAZOLAM HCL 5 MG/5ML IJ SOLN
INTRAMUSCULAR | Status: DC | PRN
  Administered 2013-09-24: 2 mg via INTRAVENOUS

## 2013-09-24 MED ORDER — FENTANYL CITRATE 0.05 MG/ML IJ SOLN: 25.0000 ug | INTRAMUSCULAR | Status: DC | PRN

## 2013-09-24 MED ORDER — ONDANSETRON HCL 4 MG/2ML IJ SOLN
INTRAMUSCULAR | Status: DC | PRN
  Administered 2013-09-24: 4 mg via INTRAVENOUS

## 2013-09-24 MED ORDER — EPHEDRINE SULFATE 50 MG/ML IJ SOLN
INTRAMUSCULAR | Status: AC
  Filled 2013-09-24: qty 1

## 2013-09-24 MED ORDER — FENTANYL CITRATE 0.05 MG/ML IJ SOLN
INTRAMUSCULAR | Status: DC | PRN
  Administered 2013-09-24 (×3): 25 ug via INTRAVENOUS

## 2013-09-24 MED ORDER — LIDOCAINE HCL (CARDIAC) 20 MG/ML IV SOLN
INTRAVENOUS | Status: DC | PRN
  Administered 2013-09-24: 100 mg via INTRAVENOUS

## 2013-09-24 MED ORDER — LACTATED RINGERS IV SOLN
INTRAVENOUS | Status: DC | PRN
  Administered 2013-09-24: 07:00:00 via INTRAVENOUS

## 2013-09-24 MED ORDER — MIDAZOLAM HCL 2 MG/2ML IJ SOLN: 1.0000 mg | INTRAMUSCULAR | Status: DC | PRN

## 2013-09-24 MED ORDER — SUCCINYLCHOLINE CHLORIDE 20 MG/ML IJ SOLN
INTRAMUSCULAR | Status: AC
  Filled 2013-09-24: qty 1

## 2013-09-24 MED ORDER — FENTANYL CITRATE 0.05 MG/ML IJ SOLN
INTRAMUSCULAR | Status: AC
  Filled 2013-09-24: qty 5

## 2013-09-24 MED ORDER — ONDANSETRON HCL 4 MG/2ML IJ SOLN
INTRAMUSCULAR | Status: AC
  Filled 2013-09-24: qty 2

## 2013-09-24 MED ORDER — PROPOFOL 10 MG/ML IV BOLUS
INTRAVENOUS | Status: AC
  Filled 2013-09-24: qty 20

## 2013-09-24 MED ORDER — SODIUM CHLORIDE 0.9 % IR SOLN
Status: DC | PRN
  Administered 2013-09-24: 1000 mL

## 2013-09-24 MED ORDER — LIDOCAINE HCL 1 % IJ SOLN
INTRAMUSCULAR | Status: DC | PRN
  Administered 2013-09-24: 30 mL

## 2013-09-24 SURGICAL SUPPLY — 70 items
ATTRACTOMAT 16X20 MAGNETIC DRP (DRAPES) IMPLANT
BAG DECANTER FOR FLEXI CONT (MISCELLANEOUS) IMPLANT
BAG URIMETER BARDEX IC 350 (UROLOGICAL SUPPLIES) IMPLANT
BANDAGE GAUZE ELAST BULKY 4 IN (GAUZE/BANDAGES/DRESSINGS) IMPLANT
BENZOIN TINCTURE PRP APPL 2/3 (GAUZE/BANDAGES/DRESSINGS) IMPLANT
BLADE SURG 10 STRL SS (BLADE) IMPLANT
CANISTER SUCTION 2500CC (MISCELLANEOUS) ×2 IMPLANT
CATH FOLEY 2WAY SLVR  5CC 16FR (CATHETERS)
CATH FOLEY 2WAY SLVR 5CC 16FR (CATHETERS) IMPLANT
CATH THORACIC 28FR RT ANG (CATHETERS) IMPLANT
CATH THORACIC 36FR (CATHETERS) IMPLANT
CATH THORACIC 36FR RT ANG (CATHETERS) IMPLANT
CLIP TI WIDE RED SMALL 24 (CLIP) IMPLANT
CONN Y 3/8X3/8X3/8  BEN (MISCELLANEOUS)
CONN Y 3/8X3/8X3/8 BEN (MISCELLANEOUS) IMPLANT
CONT SPEC 4OZ CLIKSEAL STRL BL (MISCELLANEOUS) ×2 IMPLANT
DRAPE LAPAROSCOPIC ABDOMINAL (DRAPES) ×2 IMPLANT
DRAPE WARM FLUID 44X44 (DRAPE) IMPLANT
DRSG AQUACEL AG ADV 3.5X14 (GAUZE/BANDAGES/DRESSINGS) IMPLANT
DRSG COVADERM 4X6 (GAUZE/BANDAGES/DRESSINGS) ×2 IMPLANT
DRSG PAD ABDOMINAL 8X10 ST (GAUZE/BANDAGES/DRESSINGS) IMPLANT
ELECT BLADE 4.0 EZ CLEAN MEGAD (MISCELLANEOUS) ×2
ELECT REM PT RETURN 9FT ADLT (ELECTROSURGICAL) ×2
ELECTRODE BLDE 4.0 EZ CLN MEGD (MISCELLANEOUS) ×1 IMPLANT
ELECTRODE REM PT RTRN 9FT ADLT (ELECTROSURGICAL) ×1 IMPLANT
GAUZE XEROFORM 5X9 LF (GAUZE/BANDAGES/DRESSINGS) IMPLANT
GLOVE BIO SURGEON STRL SZ 6.5 (GLOVE) ×4 IMPLANT
GLOVE BIOGEL PI IND STRL 7.0 (GLOVE) ×3 IMPLANT
GLOVE BIOGEL PI INDICATOR 7.0 (GLOVE) ×3
GOWN STRL REUS W/ TWL LRG LVL3 (GOWN DISPOSABLE) ×2 IMPLANT
GOWN STRL REUS W/TWL LRG LVL3 (GOWN DISPOSABLE) ×2
HANDPIECE INTERPULSE COAX TIP (DISPOSABLE)
HEMOSTAT POWDER SURGIFOAM 1G (HEMOSTASIS) IMPLANT
HEMOSTAT SURGICEL 2X14 (HEMOSTASIS) IMPLANT
KIT BASIN OR (CUSTOM PROCEDURE TRAY) ×2 IMPLANT
KIT ROOM TURNOVER OR (KITS) ×2 IMPLANT
KIT SUCTION CATH 14FR (SUCTIONS) IMPLANT
MARKER SKIN DUAL TIP RULER LAB (MISCELLANEOUS) IMPLANT
NS IRRIG 1000ML POUR BTL (IV SOLUTION) ×2 IMPLANT
PACK CHEST (CUSTOM PROCEDURE TRAY) ×2 IMPLANT
PAD ARMBOARD 7.5X6 YLW CONV (MISCELLANEOUS) ×4 IMPLANT
PIN SAFETY STERILE (MISCELLANEOUS) IMPLANT
RUBBERBAND STERILE (MISCELLANEOUS) IMPLANT
SET HNDPC FAN SPRY TIP SCT (DISPOSABLE) IMPLANT
SOLUTION BETADINE 4OZ (MISCELLANEOUS) IMPLANT
SPONGE GAUZE 4X4 12PLY (GAUZE/BANDAGES/DRESSINGS) ×2 IMPLANT
SPONGE GAUZE 4X4 12PLY STER LF (GAUZE/BANDAGES/DRESSINGS) ×2 IMPLANT
SPONGE LAP 18X18 X RAY DECT (DISPOSABLE) IMPLANT
STAPLER VISISTAT 35W (STAPLE) IMPLANT
STRAP MONTGOMERY 1.25X11-1/8 (MISCELLANEOUS) IMPLANT
SUT ETHILON 3 0 FSL (SUTURE) IMPLANT
SUT STEEL 6MS V (SUTURE) IMPLANT
SUT STEEL STERNAL CCS#1 18IN (SUTURE) IMPLANT
SUT STEEL SZ 6 DBL 3X14 BALL (SUTURE) IMPLANT
SUT VIC AB 1 CTX 18 (SUTURE) ×2 IMPLANT
SUT VIC AB 1 CTX 36 (SUTURE)
SUT VIC AB 1 CTX36XBRD ANBCTR (SUTURE) IMPLANT
SUT VIC AB 2-0 CT1 27 (SUTURE) ×1
SUT VIC AB 2-0 CT1 TAPERPNT 27 (SUTURE) ×1 IMPLANT
SUT VIC AB 2-0 CTX 27 (SUTURE) IMPLANT
SUT VIC AB 3-0 SH 18 (SUTURE) ×2 IMPLANT
SUT VIC AB 3-0 X1 27 (SUTURE) ×2 IMPLANT
SWAB COLLECTION DEVICE MRSA (MISCELLANEOUS) IMPLANT
SYR 5ML LL (SYRINGE) IMPLANT
SYSTEM SAHARA CHEST DRAIN ATS (WOUND CARE) IMPLANT
TAPE CLOTH SURG 4X10 WHT LF (GAUZE/BANDAGES/DRESSINGS) ×2 IMPLANT
TOWEL OR 17X24 6PK STRL BLUE (TOWEL DISPOSABLE) ×2 IMPLANT
TOWEL OR 17X26 10 PK STRL BLUE (TOWEL DISPOSABLE) ×2 IMPLANT
TUBE ANAEROBIC SPECIMEN COL (MISCELLANEOUS) IMPLANT
WATER STERILE IRR 1000ML POUR (IV SOLUTION) IMPLANT

## 2013-09-24 NOTE — Anesthesia Preprocedure Evaluation (Signed)
Anesthesia Evaluation  Patient identified by MRN, date of birth, ID band Patient awake    Reviewed: Allergy & Precautions, H&P , NPO status , Patient's Chart, lab work & pertinent test results  Airway Mallampati: II TM Distance: >3 FB Neck ROM: Full    Dental   Pulmonary shortness of breath, sleep apnea ,  + rhonchi         Cardiovascular hypertension, + CAD and + Past MI Rhythm:Regular Rate:Normal     Neuro/Psych    GI/Hepatic GERD-  ,ascites   Endo/Other  diabetes, Insulin Dependent  Renal/GU Renal InsufficiencyRenal disease     Musculoskeletal   Abdominal   Peds  Hematology   Anesthesia Other Findings   Reproductive/Obstetrics                           Anesthesia Physical Anesthesia Plan  ASA: III  Anesthesia Plan: MAC   Post-op Pain Management:    Induction: Intravenous  Airway Management Planned: Simple Face Mask  Additional Equipment:   Intra-op Plan:   Post-operative Plan:   Informed Consent: I have reviewed the patients History and Physical, chart, labs and discussed the procedure including the risks, benefits and alternatives for the proposed anesthesia with the patient or authorized representative who has indicated his/her understanding and acceptance.     Plan Discussed with: CRNA and Surgeon  Anesthesia Plan Comments:         Anesthesia Quick Evaluation

## 2013-09-24 NOTE — Transfer of Care (Signed)
Immediate Anesthesia Transfer of Care Note  Patient: [name]  Procedure(s) Performed: Procedure(s): Removal of two sternal wires (N/A) DEBRIDEMENT STERNAL WOUND (N/A)  Patient Location: PACU  Anesthesia Type:MAC  Level of Consciousness: awake, alert , oriented and patient cooperative  Airway & Oxygen Therapy: Patient Spontanous Breathing  Post-op Assessment: Report given to PACU RN, Post -op Vital signs reviewed and stable and Patient moving all extremities X 4  Post vital signs: Reviewed and stable  Complications: No apparent anesthesia complications

## 2013-09-24 NOTE — Telephone Encounter (Signed)
Unable to reach pt after numerous attempts.

## 2013-09-24 NOTE — Brief Op Note (Signed)
      [address]       [place] [id number]             [phone]     09/24/2013  8:16 AM  PATIENT:  Raymond RouteSandy C Balducci  68 y.o. male  PRE-OPERATIVE DIAGNOSIS:  PROTRUDING STERNAL WIREs  POST-OPERATIVE DIAGNOSIS:  Protruding sternal wires  PROCEDURE:  Procedure(s): Removal of two sternal wires (N/A) DEBRIDEMENT STERNAL WOUND (N/A) x 2 cm full thickness skin  SURGEON:  Surgeon(s) and Role:    * Delight OvensEdward B Sindy Mccune, MD - Primary    ANESTHESIA:   MAC  EBL:  Total I/O In: 800 [I.V.:800] Out: -   BLOOD ADMINISTERED:none  DRAINS: none   LOCAL MEDICATIONS USED:  LIDOCAINE  and Amount: 8 ml   COUNTS:  YES  TOURNIQUET:  * No tourniquets in log *  DICTATION: .Other Dictation: Dictation Number .  PLAN OF CARE: Discharge to home after PACU  PATIENT DISPOSITION:  PACU - hemodynamically stable.   Delay start of Pharmacological VTE agent (>24hrs) due to surgical blood loss or risk of bleeding: yes

## 2013-09-24 NOTE — Anesthesia Postprocedure Evaluation (Signed)
  Anesthesia Post-op Note  Patient: [name]  Procedure(s) Performed: Procedure(s): Removal of two sternal wires (N/A) DEBRIDEMENT STERNAL WOUND (N/A)  Patient Location: PACU  Anesthesia Type:MAC  Level of Consciousness: awake and alert   Airway and Oxygen Therapy: Patient Spontanous Breathing  Post-op Pain: mild  Post-op Assessment: Post-op Vital signs reviewed, Patient's Cardiovascular Status Stable, Respiratory Function Stable, Patent Airway, No signs of Nausea or vomiting and Pain level controlled  Post-op Vital Signs: Reviewed and stable  Complications: No apparent anesthesia complications

## 2013-09-24 NOTE — Discharge Instructions (Signed)
Incision Care An incision is when a surgeon cuts into your body tissues. After surgery, the incision needs to be cared for properly to prevent infection.  HOME CARE INSTRUCTIONS   Take all medicine as directed by your caregiver. Only take over-the-counter or prescription medicines for pain, discomfort, or fever as directed by your caregiver.  Do not remove your bandage (dressing) or get your incision wet until your surgeon gives you permission. In the event that your dressing becomes wet, dirty, or starts to smell, change the dressing and call your surgeon for instructions as soon as possible.  Take showers. Do not take tub baths, swim, or do anything that may soak the wound until it is healed.  Resume your normal diet and activities as directed or allowed.  Avoid lifting any weight until you are instructed otherwise.  Use anti-itch antihistamine medicine as directed by your caregiver. The wound may itch when it is healing. Do not pick or scratch at the wound.  Follow up with your caregiver for stitch (suture) or staple removal as directed.  Drink enough fluids to keep your urine clear or pale yellow. SEEK MEDICAL CARE IF:   You have redness, swelling, or increasing pain in the wound that is not controlled with medicine.  You have drainage, blood, or pus coming from the wound that lasts longer than 1 day.  You develop muscle aches, chills, or a general ill feeling.  You notice a bad smell coming from the wound or dressing.  Your wound edges separate after the sutures, staples, or skin adhesive strips have been removed.  You develop persistent nausea or vomiting. SEEK IMMEDIATE MEDICAL CARE IF:   You have a fever.  You develop a rash.  You develop dizzy episodes or faint while standing.  You have difficulty breathing.  You develop any reaction or side effects to medicine given. MAKE SURE YOU:   Understand these instructions.  Will watch your condition.  Will get help  right away if you are not doing well or get worse. Document Released: 12/25/2004 Document Revised: 08/30/2011 Document Reviewed: 10/11/2010 [facility] Patient Information 2014 [employer] [place].   What to eat:  For your first meals, you should eat lightly; only small meals initially.  If you do not have nausea, you may eat larger meals.  Avoid spicy, greasy and heavy food.    General Anesthesia, Adult, Care After  Refer to this sheet in the next few weeks. These instructions provide you with information on caring for yourself after your procedure. Your health care provider may also give you more specific instructions. Your treatment has been planned according to current medical practices, but problems sometimes occur. Call your health care provider if you have any problems or questions after your procedure.  WHAT TO EXPECT AFTER THE PROCEDURE  After the procedure, it is typical to experience:  Sleepiness.  Nausea and vomiting. HOME CARE INSTRUCTIONS  For the first 24 hours after general anesthesia:  Have a responsible person with you.  Do not drive a car. If you are alone, do not take public transportation.  Do not drink alcohol.  Do not take medicine that has not been prescribed by your health care provider.  Do not sign important papers or make important decisions.  You may resume a normal diet and activities as directed by your health care provider.  Change bandages (dressings) as directed.  If you have questions or problems that seem related to general anesthesia, call the hospital and ask for the  anesthetist or anesthesiologist on call. SEEK MEDICAL CARE IF:  You have nausea and vomiting that continue the day after anesthesia.  You develop a rash. SEEK IMMEDIATE MEDICAL CARE IF:  You have difficulty breathing.  You have chest pain.  You have any allergic problems. Document Released: 09/13/2000 Document Revised: 02/07/2013 Document Reviewed: 12/21/2012  [facility] Patient Information  2014 [place], [place].

## 2013-09-24 NOTE — H&P (Addendum)
[address]       [name] [address]             [phone]                        CAM DAUPHIN [facility] Medical Record #[id number] Date of Birth: 1945/09/10  Referring: No ref. provider found Primary Care: DOUGH,ROBERT, MD  Chief Complaint:    Exposed sternal wire   History of Present Illness:    [name] 68 y.o. male is seen in the office  today for protruding sternal wire. Patient had coronary artery bypass grafting by me in 1993 and again in 1999. He has had significant weight loss recently and her at least several months a sternal wire which is protruded through the skin      Current Activity/ Functional Status:  Patient is not independent with mobility/ambulation, transfers, ADL's, IADL's.   Zubrod Score: At the time of surgery this patient's most appropriate activity status/level should be described as: []     0    Normal activity, no symptoms []     1    Restricted in physical strenuous activity but ambulatory, able to do out light work []     2    Ambulatory and capable of self care, unable to do work activities, up and about               >50 % of waking hours                              [x]     3    Only limited self care, in bed greater than 50% of waking hours []     4    Completely disabled, no self care, confined to bed or chair []     5    Moribund   Past Medical History  Diagnosis Date  . Coronary artery disease   . Diabetes mellitus   . Hypercholesteremia   . Shortness of breath   . CHF (congestive heart failure)   . GERD (gastroesophageal reflux disease)   . Cirrhosis 09/09/2012  . 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 Gall stones 09/19/2012  . Chronic systolic CHF (congestive heart failure) 09/14/2012  . HTN (hypertension)     no longer taking meds  . Myocardial infarction   . Sleep apnea     does not use cpap  . Pneumonia   . Anemia     iron deficiency  . Constipation     Past Surgical  History  Procedure Laterality Date  . Coronary artery bypass graft    . Carotid endarterectomy    . Cardiac catheterization    . Shoulder surgery    . Esophagogastroduodenoscopy N/A 09/07/2012    Procedure: ESOPHAGOGASTRODUODENOSCOPY (EGD);  Surgeon: Beverley Fiedler, MD;  Location: [facility] ENDOSCOPY;  Service: Gastroenterology;  Laterality: N/A;  . Esophagogastroduodenoscopy N/A 09/08/2012    Procedure: ESOPHAGOGASTRODUODENOSCOPY (EGD);  Surgeon: Beverley Fiedler, MD;  Location[facility] ENDOSCOPY;  Service: Gastroenterology;  Laterality: N/A;  . Tonsillectomy    . Eye surgery Bilateral     cataract surgery  . Colonoscopy      Family History  Problem Relation Age of Onset  . Heart disease Father   . Heart disease Brother   . Diabetes Brother   . Kidney disease Brother  diabetes    History   Social History  . Marital Status: Married    Spouse Name: N/A    Number of Children: 3  . Years of Education: N/A   Occupational History  . retired    Social History Main Topics  . Smoking status: Never Smoker   . Smokeless tobacco: Never Used  . Alcohol Use: No  . Drug Use: No  . Sexual Activity: No   Other Topics Concern  . Not on file   Social History Narrative  . No narrative on file    History  Smoking status  . Never Smoker   Smokeless tobacco  . Never Used   History  Alcohol Use No     Allergies  Allergen Reactions  . Morphine And Related Other (See Comments)    Shakes & hallucinations    Current Facility-Administered Medications  Medication Dose Route Frequency Provider Last Rate Last Dose  . cefUROXime (ZINACEF) 1.5 g in dextrose 5 % 50 mL IVPB  1.5 g Intravenous 60 min Pre-Op Delight OvensEdward B Alima Naser, MD      . fentaNYL (SUBLIMAZE) injection 50-100 mcg  50-100 mcg Intravenous Once Bedelia PersonLee Kasik, MD      . midazolam (VERSED) injection 1-2 mg  1-2 mg Intravenous PRN Bedelia PersonLee Kasik, MD      . mupirocin ointment (BACTROBAN) 2 %           . sodium chloride irrigation 0.9 %    PRN Delight OvensEdward  B Celestial Barnfield, MD   1,000 mL at 09/24/13 0719   Facility-Administered Medications Ordered in Other Encounters  Medication Dose Route Frequency Provider Last Rate Last Dose  . fentaNYL (SUBLIMAZE) injection    Anesthesia Intra-op Bishop LimboJennifer S. Carter, CRNA   25 mcg at 09/24/13 0730  . lactated ringers infusion    Continuous PRN Bishop LimboJennifer S. Carter, CRNA      . midazolam (VERSED) 5 MG/5ML injection    Anesthesia Intra-op Bishop LimboJennifer S. Carter, CRNA   2 mg at 09/24/13 0724  . propofol (DIPRIVAN) 10 mg/mL bolus/IV push    Anesthesia Intra-op Bishop LimboJennifer S. Carter, CRNA   20 mg at 09/24/13 [id number]     Review of Systems:     Cardiac Review of Systems: Y or N  Chest Pain [  y  ]  Resting SOB [ y  ] Exertional SOB  [ y ]  Pollyann Kennedyrthopnea Cove.Etienne[y  ]   Pedal Edema Cove.Etienne[y   ]    Palpitations Cove.Etienne[y  ] Syncope  Milo.Brash[n  ]   Presyncope [ n  ]  General Review of Systems: [Y] = yes [  ]=no Constitional: recent weight change [  ];  Wt loss over the last 3 months [   ] anorexia [  ]; fatigue [  ]; nausea [  ]; night sweats [  ]; fever [  ]; or chills [  ];          Dental: poor dentition[  ]; Last Dentist visit:   Eye : blurred vision [  ]; diplopia [   ]; vision changes [  ];  Amaurosis fugax[  ]; Resp: cough [  ];  wheezing[  ];  hemoptysis[  ]; shortness of breath[  ]; paroxysmal nocturnal dyspnea[  ]; dyspnea on exertion[  ]; or orthopnea[  ];  GI:  gallstones[  ], vomiting[  ];  dysphagia[  ]; melena[  ];  hematochezia [  ]; heartburn[  ];   Hx of  Colonoscopy[  ];  GU: kidney stones [  ]; hematuria[  ];   dysuria [  ];  nocturia[  ];  history of     obstruction [  ]; urinary frequency [  ]             Skin: rash, swelling[  ];, hair loss[  ];  peripheral edema[  ];  or itching[  ]; Musculosketetal: myalgias[  ];  joint swelling[  ];  joint erythema[  ];  joint pain[  ];  back pain[  ];  Heme/Lymph: bruising[  ];  bleeding[  ];  anemia[  ];  Neuro: TIA[  ];  headaches[  ];  stroke[  ];  vertigo[  ];  seizures[  ];   paresthesias[  ];   difficulty walking[  ];  Psych:depression[  ]; anxiety[  ];  Endocrine: diabetes[  ];  thyroid dysfunction[  ];  Immunizations: Flu up to date [  ]; Pneumococcal up to date [  ];  Other:  Physical Exam: BP 141/77  Pulse 82  Temp(Src) 98.9 F (37.2 C) (Oral)  Resp 20  SpO2 97%  PHYSICAL EXAMINATION:  General appearance: alert, cooperative, appears older than stated age, cachectic and no distress Neurologic: intact Heart: regular rate and rhythm, S1, S2 normal, no murmur, click, rub or gallop Lungs: diminished breath sounds bibasilar Abdomen: soft, non-tender; bowel sounds normal; no masses,  no organomegaly Extremities: extremities normal, atraumatic, no cyanosis or edema Wound: Sternal wound is examined in the midportion is a sternal wire which is pursuing to the skin with surrounding erythema   Diagnostic Studies & Laboratory data:     Recent Radiology Findings:   Dg Chest 2 View  09/20/2013   CLINICAL DATA:  Protrusion of sternal wires, some chest pain  EXAM: CHEST  2 VIEW  COMPARISON:  Chest x-ray of 09/17/2012, and CT chest of 09/17/2012  FINDINGS: Chronic changes are stable with pleural thickening at the right lung base and blunted right costophrenic angle. There is volume loss on the right is well. Multiple right rib fractures again are noted as demonstrated on prior CT at least involving the right sixth, seventh, eighth, ninth, and tenth ribs laterally. No pneumothorax is noted. The left lung is clear. Mild cardiomegaly is stable. At least 1 of the median sternotomy sutures does extend anteriorly toward the skin surface.  IMPRESSION: 1. At least 1 sternal suture wire does extend toward the skin surface anteriorly. 2. Probable chronic change at the right lung base with pleural thickening and volume loss. 3. Multiple right rib fractures as noted on prior CT.   Electronically Signed   By: Dwyane Dee M.D.   On: 09/20/2013 16:00   Dg Chest 2 View  09/20/2013   This is the back office  imaging final text. See progress note  for physician's narrative and interpretation.      Recent Lab Findings: Lab Results  Component Value Date   WBC 6.2 09/24/2013   HGB 9.2* 09/24/2013   HCT 28.9* 09/24/2013   PLT 256 09/24/2013   GLUCOSE 105* 09/24/2013   CHOL 89 08/22/2013   TRIG 55.0 08/22/2013   HDL 34.40* 08/22/2013   LDLCALC 44 08/22/2013   ALT 10 09/24/2013   AST 20 09/24/2013   NA 140 09/24/2013   K 3.8 09/24/2013   CL 99 09/24/2013   CREATININE 1.45* 09/24/2013   BUN 36* 09/24/2013   CO2 27 09/24/2013   TSH 1.237 09/01/2012   INR 1.06 09/24/2013   HGBA1C 6.3*  09/17/2012  .  Assessment / Plan:      Patient now moderately cachectic and with protruding sternal wire with necrosis of the overlying skin. I recommended to the patient that we remove the protruding sternal wire he is agreeable with this we will arrange for Monday, April 6.  The goals risks and alternatives of the planned surgical procedure removal sternal wire(s) have been discussed with the patient in detail. The risks of the procedure including death, infection, stroke, myocardial infarction, bleeding, blood transfusion have all been discussed specifically.  I have quoted Florene Route a 2% of perioperative mortality and a complication rate as high as 10 %. The patient's questions have been answered.KEYMARI SATO is willing  to proceed with the planned procedure.    Delight Ovens MD      [address] [place].[address] [place] [address] Office [phone]   Beeper [phone]  09/24/2013 7:36 AM

## 2013-09-25 NOTE — Op Note (Signed)
NAMThomes Dinning:  Wickens, Keaton               ACCOUNT NO.:  [id number]  MEDICAL RECORD NO.:  [id number]  LOCATION:  [facility]                         FACILITY:  [facility]  PHYSICIAN:  Sheliah PlaneEdward Peyson Delao, MD    DATE OF BIRTH:  1945/07/20  DATE OF PROCEDURE:  09/24/2013 DATE OF DISCHARGE:  09/24/2013                              OPERATIVE REPORT   PREOPERATIVE DIAGNOSIS:  Eroding painful sternal wire.  POSTOPERATIVE DIAGNOSIS:  Eroding painful sternal wire.  SURGICAL PROCEDURE:  Removal of sternal wires x2, and debridement of 2 cm full thickness wound.  SURGEON:  Sheliah PlaneEdward Zuleica Seith, MD  BRIEF HISTORY:  The patient is a 68 year old male who in 1999 underwent coronary artery bypass grafting.  Over the years, he has lost significant amount of weight and presented with an ulcerated lesion in the middle portion of his chest secondary to erosion of a sternal wire through the skin.  In addition, the uppermost wire was slightly tender and easily palpable but not eroding.  Removal of the sternal wire was recommended to the patient who agreed and signed informed consent.  DESCRIPTION OF PROCEDURE:  The patient under MAC anesthesia was in the supine position.  Appropriate time-out was performed.  The skin of the chest was prepped with Betadine and draped in a sterile manner.  An 8 mL of 1% lidocaine was infiltrated around the 2 palpable sternal wires.  A long incision approximately 2 cm was made over the top of the sternal wire that was protruding.  This wire was untwisted and easily removed. The right edge of the wound was sharply excised full-thickness skin to remove the area of granulation where the wire had been protruding through the skin.  A small incision made higher upper portion of the sternum and the second sternal wire was removed.  Each of the wounds were closed with interrupted 3-0 Vicryl in deep tissues, and a running 3- 0 subcuticular stitch in skin edges.  Dry dressings were applied.  The patient  tolerated the procedure without obvious complication.  Blood loss was minimal.  Sponge and needle count was reported as correct at the completion of the procedure.  The patient was transferred to the recovery room for further postoperative care.     Sheliah PlaneEdward Taras Rask, MD     EG/MEDQ  D:  09/25/2013  T:  09/25/2013  Job:  [id number]

## 2013-09-26 ENCOUNTER — Encounter (HOSPITAL_COMMUNITY): Payer: Self-pay | Admitting: Cardiothoracic Surgery

## 2013-10-11 ENCOUNTER — Ambulatory Visit: Payer: PRIVATE HEALTH INSURANCE | Admitting: Cardiology

## 2013-10-15 ENCOUNTER — Ambulatory Visit: Payer: PRIVATE HEALTH INSURANCE | Admitting: Cardiology

## 2013-10-19 NOTE — Telephone Encounter (Signed)
error 

## 2013-10-25 ENCOUNTER — Telehealth: Payer: Self-pay

## 2013-10-25 ENCOUNTER — Ambulatory Visit: Payer: PRIVATE HEALTH INSURANCE | Admitting: Cardiology

## 2013-10-25 NOTE — Telephone Encounter (Signed)
[facility] per Ileene Hutchinsonbituary

## 2013-11-19 DEATH — deceased

## 2014-05-30 ENCOUNTER — Encounter (HOSPITAL_COMMUNITY): Payer: Self-pay | Admitting: Cardiology

## 2014-08-07 IMAGING — CR DG CHEST 2V
2 series · 2 of 2 positions shown · non-contrast
Comparison: Chest x-ray of 09/17/2012, and CT chest of 09/17/2012

CLINICAL DATA: Protrusion of sternal wires, some chest pain

EXAM:
CHEST  2 VIEW

[w chest lat]
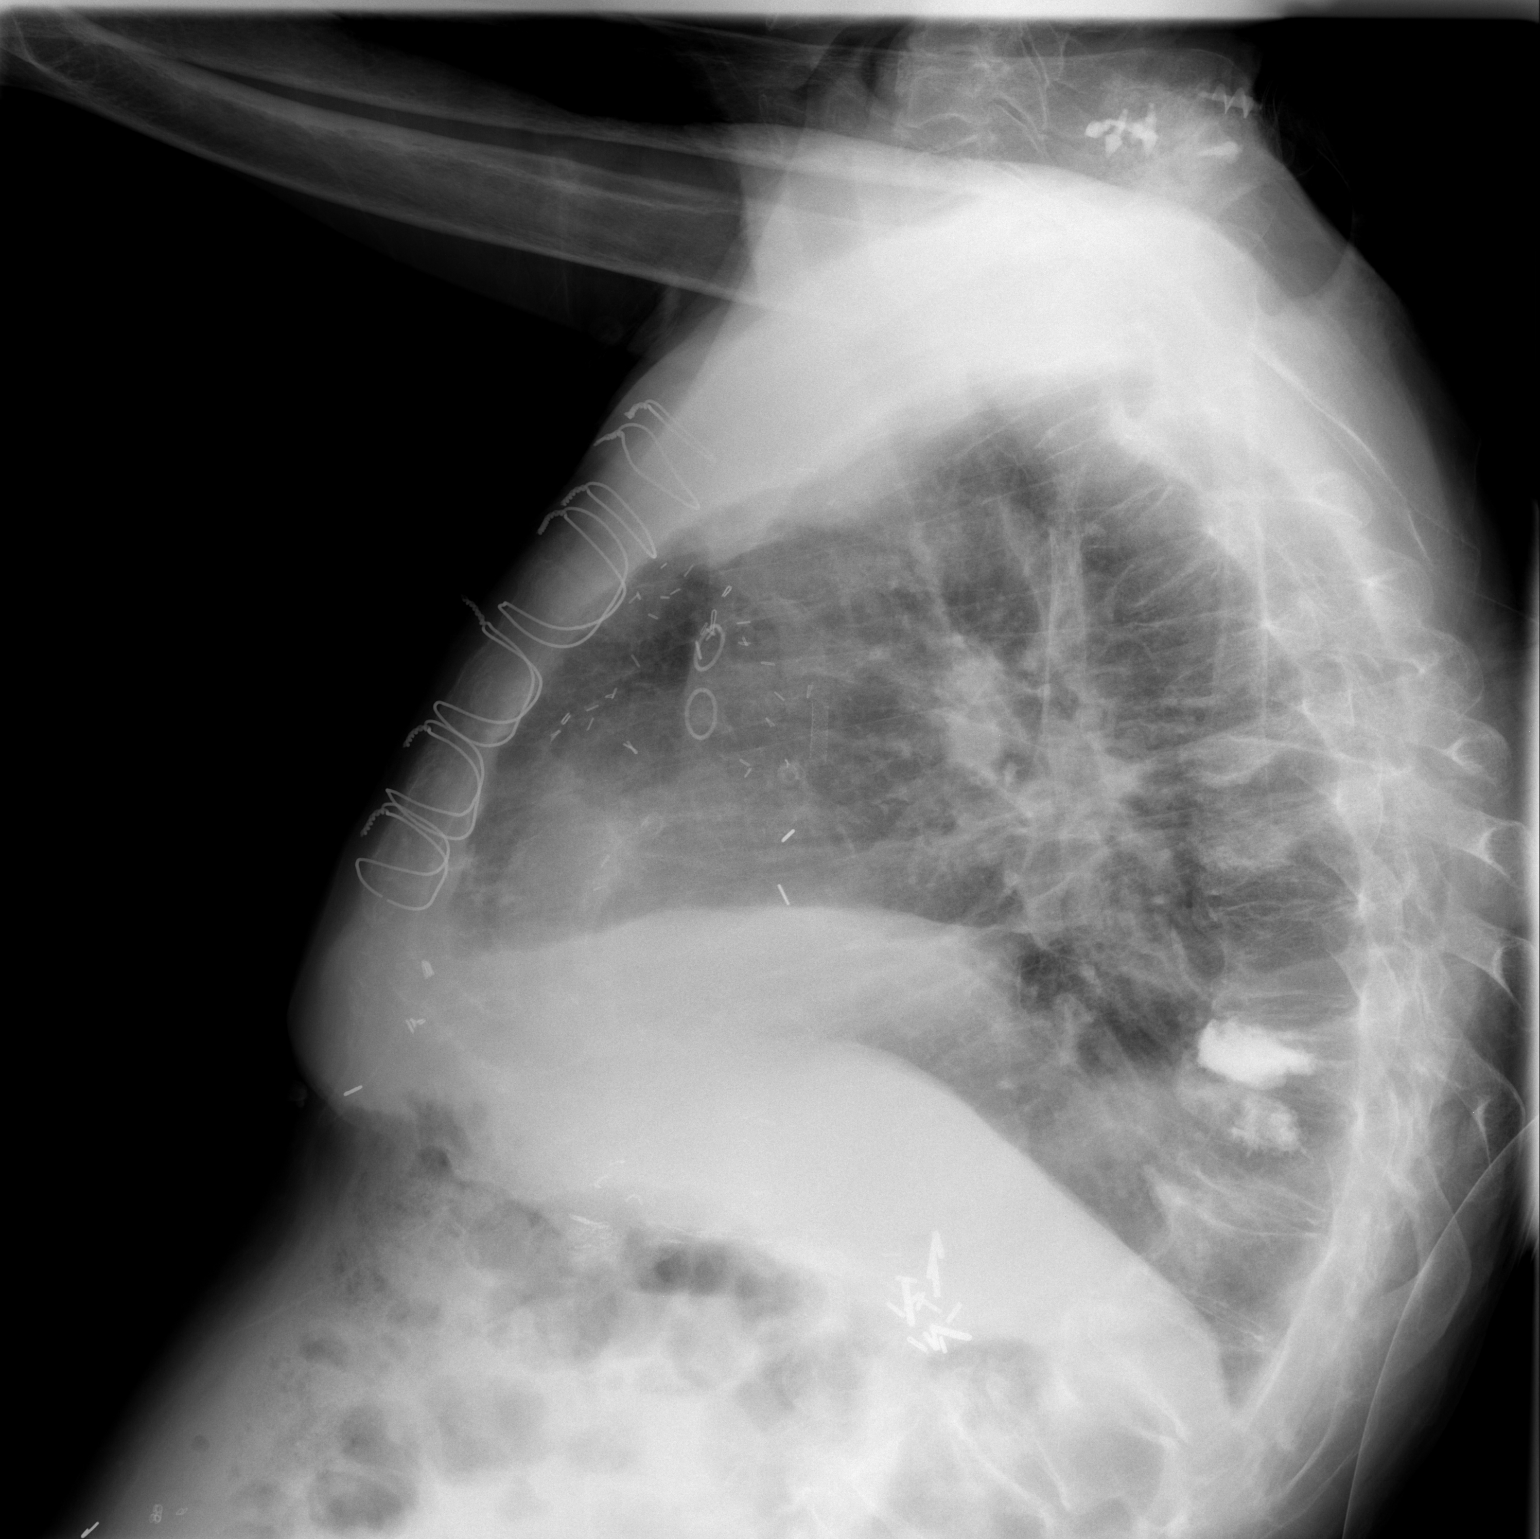

[w chest ap]
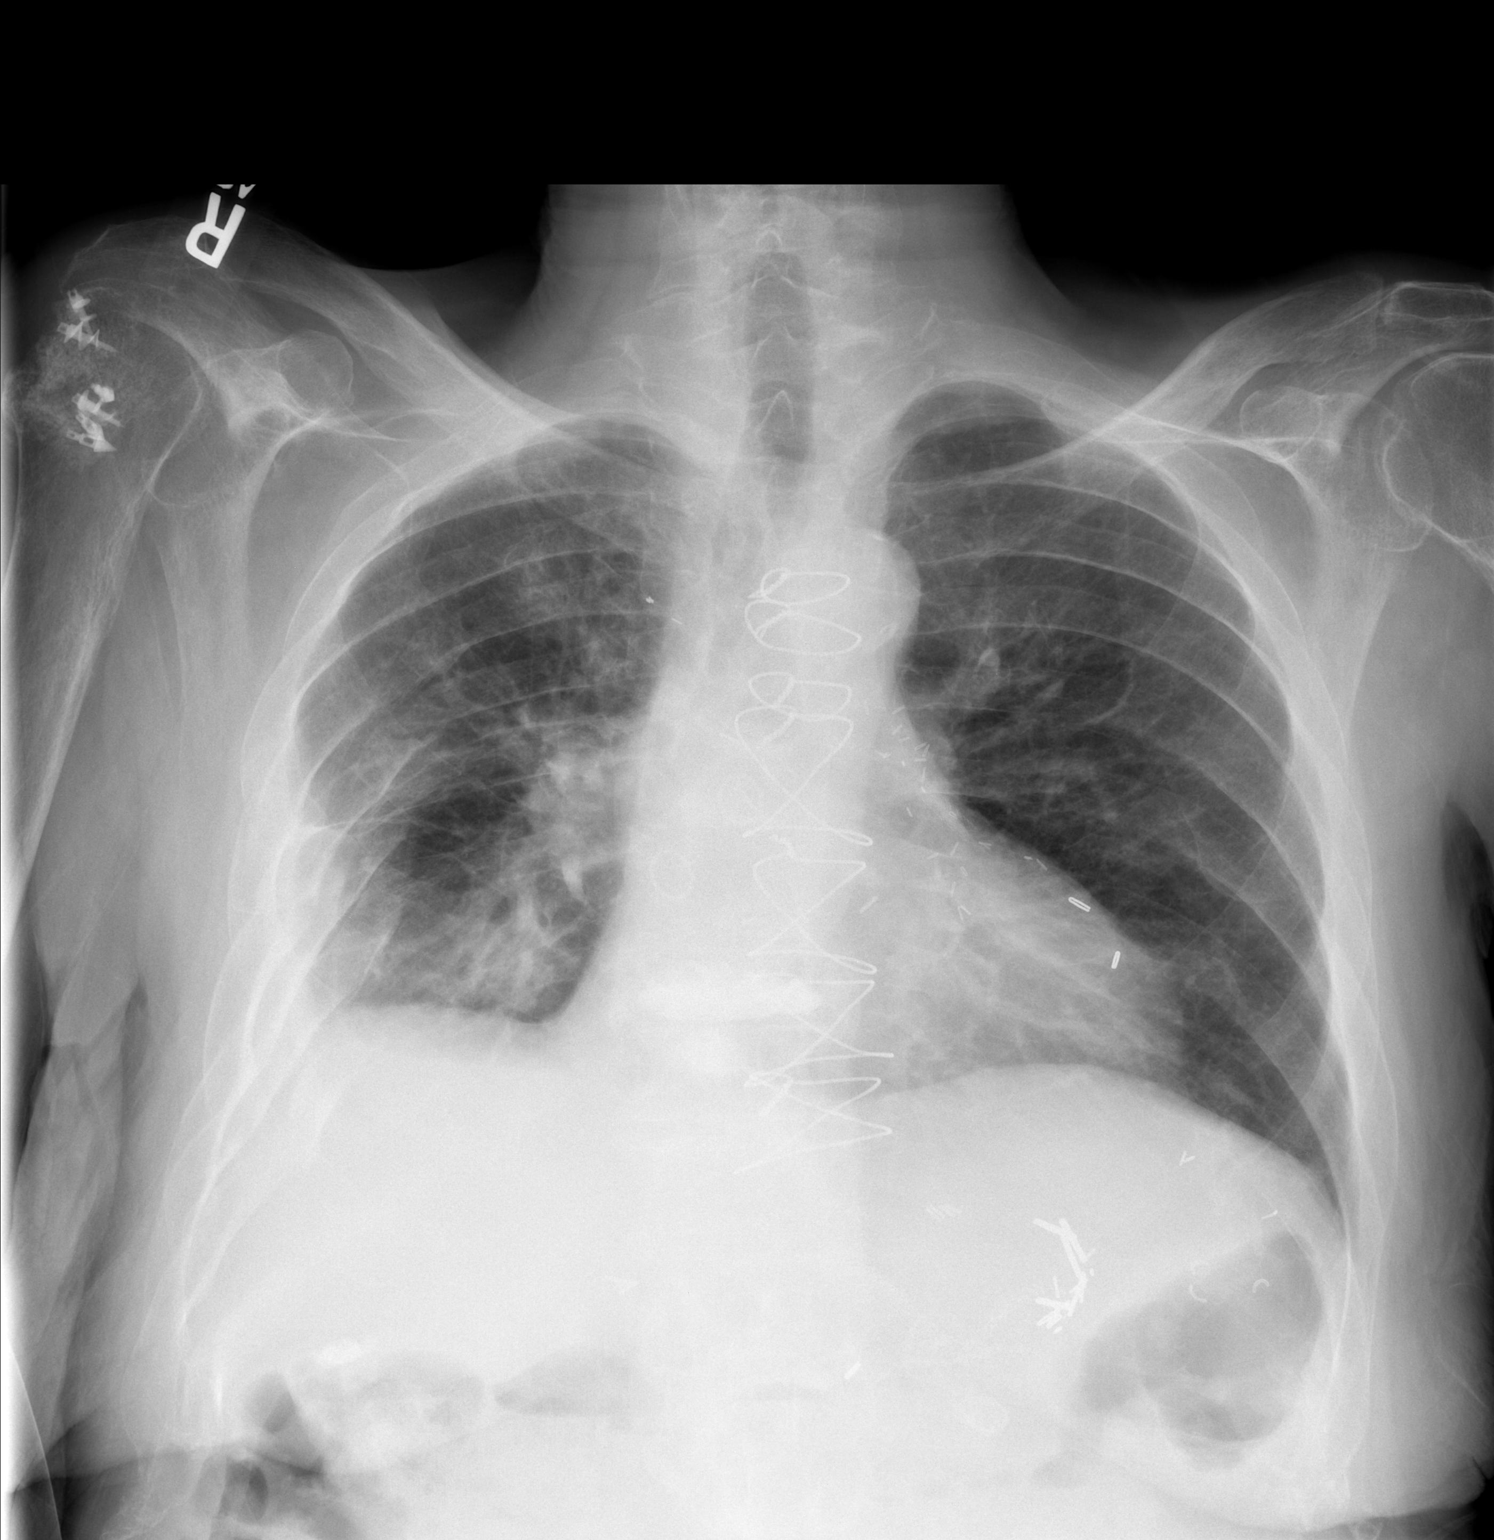

[2 of 2 positions shown; findings below may reference images not displayed]

FINDINGS: Chronic changes are stable with pleural thickening at the right lung
base and blunted right costophrenic angle. There is volume loss on
the right is well. Multiple right rib fractures again are noted as
demonstrated on prior CT at least involving the right sixth,
seventh, eighth, ninth, and tenth ribs laterally. No pneumothorax is
noted. The left lung is clear. Mild cardiomegaly is stable. At least
1 of the median sternotomy sutures does extend anteriorly toward the
skin surface.
IMPRESSION: 1. At least 1 sternal suture wire does extend toward the skin
surface anteriorly.
2. Probable chronic change at the right lung base with pleural
thickening and volume loss.
3. Multiple right rib fractures as noted on prior CT.
